# Patient Record
Sex: Male | Born: 1965
Health system: Southern US, Community
[De-identification: ages and names within clinical notes are randomized; demographics above are authoritative.]

## PROBLEM LIST (undated history)

## (undated) DIAGNOSIS — I10 Essential (primary) hypertension: Secondary | ICD-10-CM

## (undated) DIAGNOSIS — F329 Major depressive disorder, single episode, unspecified: Secondary | ICD-10-CM

## (undated) DIAGNOSIS — F32A Depression, unspecified: Secondary | ICD-10-CM

## (undated) DIAGNOSIS — W3400XA Accidental discharge from unspecified firearms or gun, initial encounter: Secondary | ICD-10-CM

## (undated) DIAGNOSIS — K7030 Alcoholic cirrhosis of liver without ascites: Secondary | ICD-10-CM

## (undated) DIAGNOSIS — F17200 Nicotine dependence, unspecified, uncomplicated: Secondary | ICD-10-CM

## (undated) DIAGNOSIS — Y249XXA Unspecified firearm discharge, undetermined intent, initial encounter: Secondary | ICD-10-CM

## (undated) DIAGNOSIS — F1090 Alcohol use, unspecified, uncomplicated: Secondary | ICD-10-CM

## (undated) HISTORY — DX: Essential (primary) hypertension: I10

## (undated) HISTORY — DX: Nicotine dependence, unspecified, uncomplicated: F17.200

## (undated) HISTORY — DX: Alcohol use, unspecified, uncomplicated: F10.90

## (undated) HISTORY — DX: Alcoholic cirrhosis of liver without ascites: K70.30

## (undated) HISTORY — PX: OTHER SURGICAL HISTORY: SHX169

---

## 1998-08-25 ENCOUNTER — Emergency Department (HOSPITAL_COMMUNITY): Admission: EM | Admit: 1998-08-25 | Discharge: 1998-08-25 | Payer: Self-pay | Admitting: Emergency Medicine

## 1998-12-03 ENCOUNTER — Emergency Department (HOSPITAL_COMMUNITY): Admission: EM | Admit: 1998-12-03 | Discharge: 1998-12-03 | Payer: Self-pay | Admitting: Emergency Medicine

## 1999-12-04 ENCOUNTER — Encounter: Payer: Self-pay | Admitting: Emergency Medicine

## 1999-12-04 ENCOUNTER — Emergency Department (HOSPITAL_COMMUNITY): Admission: EM | Admit: 1999-12-04 | Discharge: 1999-12-04 | Payer: Self-pay | Admitting: Emergency Medicine

## 1999-12-06 ENCOUNTER — Inpatient Hospital Stay: Admission: EM | Admit: 1999-12-06 | Discharge: 1999-12-07 | Payer: Self-pay | Admitting: Emergency Medicine

## 1999-12-12 ENCOUNTER — Ambulatory Visit (HOSPITAL_BASED_OUTPATIENT_CLINIC_OR_DEPARTMENT_OTHER): Admission: RE | Admit: 1999-12-12 | Discharge: 1999-12-12 | Payer: Self-pay | Admitting: *Deleted

## 2000-01-05 ENCOUNTER — Encounter: Payer: Self-pay | Admitting: *Deleted

## 2000-01-05 ENCOUNTER — Ambulatory Visit (HOSPITAL_COMMUNITY): Admission: RE | Admit: 2000-01-05 | Discharge: 2000-01-05 | Payer: Self-pay | Admitting: *Deleted

## 2000-01-06 ENCOUNTER — Ambulatory Visit (HOSPITAL_BASED_OUTPATIENT_CLINIC_OR_DEPARTMENT_OTHER): Admission: RE | Admit: 2000-01-06 | Discharge: 2000-01-07 | Payer: Self-pay | Admitting: *Deleted

## 2000-01-06 ENCOUNTER — Encounter (INDEPENDENT_AMBULATORY_CARE_PROVIDER_SITE_OTHER): Payer: Self-pay | Admitting: *Deleted

## 2000-04-08 ENCOUNTER — Encounter: Payer: Self-pay | Admitting: Emergency Medicine

## 2000-04-08 ENCOUNTER — Inpatient Hospital Stay (HOSPITAL_COMMUNITY): Admission: EM | Admit: 2000-04-08 | Discharge: 2000-04-09 | Payer: Self-pay | Admitting: Emergency Medicine

## 2000-05-07 ENCOUNTER — Emergency Department (HOSPITAL_COMMUNITY): Admission: EM | Admit: 2000-05-07 | Discharge: 2000-05-08 | Payer: Self-pay | Admitting: Emergency Medicine

## 2001-08-15 ENCOUNTER — Emergency Department (HOSPITAL_COMMUNITY): Admission: EM | Admit: 2001-08-15 | Discharge: 2001-08-15 | Payer: Self-pay | Admitting: Emergency Medicine

## 2002-08-11 ENCOUNTER — Emergency Department (HOSPITAL_COMMUNITY): Admission: EM | Admit: 2002-08-11 | Discharge: 2002-08-11 | Payer: Self-pay

## 2002-11-09 ENCOUNTER — Encounter: Payer: Self-pay | Admitting: Emergency Medicine

## 2002-11-09 ENCOUNTER — Emergency Department (HOSPITAL_COMMUNITY): Admission: EM | Admit: 2002-11-09 | Discharge: 2002-11-09 | Payer: Self-pay | Admitting: Emergency Medicine

## 2003-03-29 ENCOUNTER — Emergency Department (HOSPITAL_COMMUNITY): Admission: EM | Admit: 2003-03-29 | Discharge: 2003-03-29 | Payer: Self-pay | Admitting: Emergency Medicine

## 2003-09-12 ENCOUNTER — Emergency Department (HOSPITAL_COMMUNITY): Admission: EM | Admit: 2003-09-12 | Discharge: 2003-09-12 | Payer: Self-pay | Admitting: Emergency Medicine

## 2004-04-06 ENCOUNTER — Emergency Department (HOSPITAL_COMMUNITY): Admission: EM | Admit: 2004-04-06 | Discharge: 2004-04-07 | Payer: Self-pay | Admitting: Emergency Medicine

## 2005-04-28 ENCOUNTER — Emergency Department (HOSPITAL_COMMUNITY): Admission: EM | Admit: 2005-04-28 | Discharge: 2005-04-28 | Payer: Self-pay | Admitting: Emergency Medicine

## 2005-06-17 ENCOUNTER — Emergency Department (HOSPITAL_COMMUNITY): Admission: EM | Admit: 2005-06-17 | Discharge: 2005-06-17 | Payer: Self-pay | Admitting: Emergency Medicine

## 2007-03-25 ENCOUNTER — Inpatient Hospital Stay (HOSPITAL_COMMUNITY): Admission: EM | Admit: 2007-03-25 | Discharge: 2007-03-29 | Payer: Self-pay | Admitting: Emergency Medicine

## 2008-03-11 ENCOUNTER — Ambulatory Visit: Payer: Self-pay | Admitting: Nurse Practitioner

## 2008-03-11 DIAGNOSIS — M25569 Pain in unspecified knee: Secondary | ICD-10-CM | POA: Insufficient documentation

## 2008-03-11 DIAGNOSIS — F17200 Nicotine dependence, unspecified, uncomplicated: Secondary | ICD-10-CM | POA: Insufficient documentation

## 2008-03-17 ENCOUNTER — Encounter (INDEPENDENT_AMBULATORY_CARE_PROVIDER_SITE_OTHER): Payer: Self-pay | Admitting: Nurse Practitioner

## 2008-03-18 ENCOUNTER — Encounter (INDEPENDENT_AMBULATORY_CARE_PROVIDER_SITE_OTHER): Payer: Self-pay | Admitting: Nurse Practitioner

## 2008-03-19 ENCOUNTER — Emergency Department (HOSPITAL_COMMUNITY): Admission: EM | Admit: 2008-03-19 | Discharge: 2008-03-20 | Payer: Self-pay | Admitting: Emergency Medicine

## 2008-03-20 ENCOUNTER — Inpatient Hospital Stay (HOSPITAL_COMMUNITY): Admission: RE | Admit: 2008-03-20 | Discharge: 2008-03-23 | Payer: Self-pay | Admitting: Psychiatry

## 2008-03-20 ENCOUNTER — Ambulatory Visit: Payer: Self-pay | Admitting: Psychiatry

## 2008-03-24 ENCOUNTER — Encounter (INDEPENDENT_AMBULATORY_CARE_PROVIDER_SITE_OTHER): Payer: Self-pay | Admitting: Nurse Practitioner

## 2008-04-13 ENCOUNTER — Ambulatory Visit: Payer: Self-pay | Admitting: Nurse Practitioner

## 2008-04-13 DIAGNOSIS — K219 Gastro-esophageal reflux disease without esophagitis: Secondary | ICD-10-CM | POA: Insufficient documentation

## 2008-04-13 LAB — CONVERTED CEMR LAB
ALT: 16 units/L (ref 0–53)
Alkaline Phosphatase: 85 units/L (ref 39–117)
Basophils Relative: 1 % (ref 0–1)
Calcium: 9.4 mg/dL (ref 8.4–10.5)
Cholesterol: 144 mg/dL (ref 0–200)
Creatinine, Ser: 0.96 mg/dL (ref 0.40–1.50)
Eosinophils Absolute: 0.1 10*3/uL (ref 0.0–0.7)
Eosinophils Relative: 3 % (ref 0–5)
GC Probe Amp, Urine: NEGATIVE
Glucose, Bld: 89 mg/dL (ref 70–99)
Glucose, Urine, Semiquant: NEGATIVE
HDL: 54 mg/dL (ref >39)
Ketones, urine, test strip: NEGATIVE
LDL Cholesterol: 61 mg/dL (ref 0–99)
Lymphocytes Relative: 29 % (ref 12–46)
Lymphs Abs: 1.4 10*3/uL (ref 0.7–4.0)
MCHC: 32.3 g/dL (ref 30.0–36.0)
MCV: 95.4 fL (ref 78.0–100.0)
Neutrophils Relative %: 54 % (ref 43–77)
Nitrite: NEGATIVE
Platelets: 190 10*3/uL (ref 150–400)
Potassium: 4.4 meq/L (ref 3.5–5.3)
RDW: 14.9 % (ref 11.5–15.5)
Specific Gravity, Urine: 1.015
TSH: 1.779 microintl units/mL (ref 0.350–4.50)
Total CHOL/HDL Ratio: 2.7
Triglycerides: 144 mg/dL (ref <150)
WBC Urine, dipstick: NEGATIVE
WBC: 4.7 10*3/uL (ref 4.0–10.5)

## 2008-04-15 ENCOUNTER — Encounter (INDEPENDENT_AMBULATORY_CARE_PROVIDER_SITE_OTHER): Payer: Self-pay | Admitting: Nurse Practitioner

## 2008-04-22 ENCOUNTER — Telehealth (INDEPENDENT_AMBULATORY_CARE_PROVIDER_SITE_OTHER): Payer: Self-pay | Admitting: Nurse Practitioner

## 2008-05-14 ENCOUNTER — Ambulatory Visit: Payer: Self-pay | Admitting: Nurse Practitioner

## 2008-05-14 DIAGNOSIS — F528 Other sexual dysfunction not due to a substance or known physiological condition: Secondary | ICD-10-CM | POA: Insufficient documentation

## 2008-05-14 DIAGNOSIS — N529 Male erectile dysfunction, unspecified: Secondary | ICD-10-CM | POA: Insufficient documentation

## 2008-05-21 ENCOUNTER — Encounter (INDEPENDENT_AMBULATORY_CARE_PROVIDER_SITE_OTHER): Payer: Self-pay | Admitting: Nurse Practitioner

## 2008-08-20 ENCOUNTER — Ambulatory Visit: Payer: Self-pay | Admitting: Nurse Practitioner

## 2008-08-20 DIAGNOSIS — K029 Dental caries, unspecified: Secondary | ICD-10-CM | POA: Insufficient documentation

## 2008-08-21 ENCOUNTER — Encounter (INDEPENDENT_AMBULATORY_CARE_PROVIDER_SITE_OTHER): Payer: Self-pay | Admitting: Nurse Practitioner

## 2008-10-09 ENCOUNTER — Telehealth (INDEPENDENT_AMBULATORY_CARE_PROVIDER_SITE_OTHER): Payer: Self-pay | Admitting: Nurse Practitioner

## 2008-11-29 ENCOUNTER — Emergency Department (HOSPITAL_COMMUNITY): Admission: EM | Admit: 2008-11-29 | Discharge: 2008-11-29 | Payer: Self-pay | Admitting: Emergency Medicine

## 2008-12-24 ENCOUNTER — Ambulatory Visit: Payer: Self-pay | Admitting: Nurse Practitioner

## 2008-12-24 DIAGNOSIS — M545 Low back pain, unspecified: Secondary | ICD-10-CM | POA: Insufficient documentation

## 2008-12-24 DIAGNOSIS — M549 Dorsalgia, unspecified: Secondary | ICD-10-CM | POA: Insufficient documentation

## 2008-12-25 ENCOUNTER — Encounter (INDEPENDENT_AMBULATORY_CARE_PROVIDER_SITE_OTHER): Payer: Self-pay | Admitting: Nurse Practitioner

## 2008-12-28 ENCOUNTER — Telehealth (INDEPENDENT_AMBULATORY_CARE_PROVIDER_SITE_OTHER): Payer: Self-pay | Admitting: Nurse Practitioner

## 2009-03-29 ENCOUNTER — Telehealth (INDEPENDENT_AMBULATORY_CARE_PROVIDER_SITE_OTHER): Payer: Self-pay | Admitting: *Deleted

## 2009-05-13 ENCOUNTER — Ambulatory Visit: Payer: Self-pay | Admitting: Nurse Practitioner

## 2009-05-13 DIAGNOSIS — H6120 Impacted cerumen, unspecified ear: Secondary | ICD-10-CM | POA: Insufficient documentation

## 2009-05-13 LAB — CONVERTED CEMR LAB
ALT: 18 units/L (ref 0–53)
AST: 17 units/L (ref 0–37)
Albumin: 4.2 g/dL (ref 3.5–5.2)
Alkaline Phosphatase: 79 units/L (ref 39–117)
Basophils Relative: 1 % (ref 0–1)
CO2: 22 meq/L (ref 19–32)
Chloride: 106 meq/L (ref 96–112)
Eosinophils Absolute: 0.1 10*3/uL (ref 0.0–0.7)
HCT: 44 % (ref 39.0–52.0)
Potassium: 4.3 meq/L (ref 3.5–5.3)
RBC: 4.63 M/uL (ref 4.22–5.81)
Rapid HIV Screen: NEGATIVE
TSH: 1.197 microintl units/mL (ref 0.350–4.500)
Total Protein: 6.5 g/dL (ref 6.0–8.3)

## 2009-05-19 ENCOUNTER — Ambulatory Visit (HOSPITAL_COMMUNITY): Admission: RE | Admit: 2009-05-19 | Discharge: 2009-05-19 | Payer: Self-pay | Admitting: Internal Medicine

## 2009-05-21 ENCOUNTER — Ambulatory Visit: Payer: Self-pay | Admitting: Nurse Practitioner

## 2009-05-21 LAB — CONVERTED CEMR LAB
HDL: 56 mg/dL (ref >39)
Triglycerides: 77 mg/dL (ref <150)
VLDL: 15 mg/dL (ref 0–40)

## 2009-05-24 ENCOUNTER — Encounter (INDEPENDENT_AMBULATORY_CARE_PROVIDER_SITE_OTHER): Payer: Self-pay | Admitting: Nurse Practitioner

## 2009-06-08 ENCOUNTER — Telehealth (INDEPENDENT_AMBULATORY_CARE_PROVIDER_SITE_OTHER): Payer: Self-pay | Admitting: Nurse Practitioner

## 2009-08-06 ENCOUNTER — Emergency Department (HOSPITAL_COMMUNITY): Admission: EM | Admit: 2009-08-06 | Discharge: 2009-08-06 | Payer: Self-pay | Admitting: Emergency Medicine

## 2009-08-16 ENCOUNTER — Ambulatory Visit: Payer: Self-pay | Admitting: Nurse Practitioner

## 2009-08-16 DIAGNOSIS — K047 Periapical abscess without sinus: Secondary | ICD-10-CM | POA: Insufficient documentation

## 2009-08-20 ENCOUNTER — Encounter (INDEPENDENT_AMBULATORY_CARE_PROVIDER_SITE_OTHER): Payer: Self-pay | Admitting: Nurse Practitioner

## 2009-10-21 ENCOUNTER — Telehealth (INDEPENDENT_AMBULATORY_CARE_PROVIDER_SITE_OTHER): Payer: Self-pay | Admitting: *Deleted

## 2009-11-19 ENCOUNTER — Encounter (INDEPENDENT_AMBULATORY_CARE_PROVIDER_SITE_OTHER): Payer: Self-pay | Admitting: Nurse Practitioner

## 2009-12-07 ENCOUNTER — Ambulatory Visit: Payer: Self-pay | Admitting: Nurse Practitioner

## 2010-01-05 ENCOUNTER — Encounter (INDEPENDENT_AMBULATORY_CARE_PROVIDER_SITE_OTHER): Payer: Self-pay | Admitting: Nurse Practitioner

## 2010-03-25 ENCOUNTER — Ambulatory Visit: Payer: Self-pay | Admitting: Nurse Practitioner

## 2010-03-25 DIAGNOSIS — F329 Major depressive disorder, single episode, unspecified: Secondary | ICD-10-CM | POA: Insufficient documentation

## 2010-04-05 ENCOUNTER — Telehealth (INDEPENDENT_AMBULATORY_CARE_PROVIDER_SITE_OTHER): Payer: Self-pay | Admitting: *Deleted

## 2010-04-12 ENCOUNTER — Encounter (INDEPENDENT_AMBULATORY_CARE_PROVIDER_SITE_OTHER): Payer: Self-pay | Admitting: Nurse Practitioner

## 2010-05-20 ENCOUNTER — Encounter (INDEPENDENT_AMBULATORY_CARE_PROVIDER_SITE_OTHER): Payer: Self-pay | Admitting: Nurse Practitioner

## 2010-07-07 ENCOUNTER — Ambulatory Visit
Admission: RE | Admit: 2010-07-07 | Discharge: 2010-07-07 | Payer: Self-pay | Source: Home / Self Care | Attending: Nurse Practitioner | Admitting: Nurse Practitioner

## 2010-07-07 ENCOUNTER — Encounter (INDEPENDENT_AMBULATORY_CARE_PROVIDER_SITE_OTHER): Payer: Self-pay | Admitting: Nurse Practitioner

## 2010-07-07 LAB — CONVERTED CEMR LAB
Alkaline Phosphatase: 74 units/L (ref 39–117)
BUN: 12 mg/dL (ref 6–23)
Bilirubin Urine: NEGATIVE
Creatinine, Ser: 0.97 mg/dL (ref 0.40–1.50)
Eosinophils Absolute: 0.1 10*3/uL (ref 0.0–0.7)
HCT: 43.2 % (ref 39.0–52.0)
Hemoglobin: 14.1 g/dL (ref 13.0–17.0)
MCHC: 32.6 g/dL (ref 30.0–36.0)
MCV: 93.5 fL (ref 78.0–100.0)
Neutrophils Relative %: 39 % — ABNORMAL LOW (ref 43–77)
Nitrite: NEGATIVE
Potassium: 4.3 meq/L (ref 3.5–5.3)
Rapid HIV Screen: NEGATIVE
Sodium: 142 meq/L (ref 135–145)
Total Protein: 6.9 g/dL (ref 6.0–8.3)
WBC Urine, dipstick: NEGATIVE

## 2010-07-08 ENCOUNTER — Encounter (INDEPENDENT_AMBULATORY_CARE_PROVIDER_SITE_OTHER): Payer: Self-pay | Admitting: Nurse Practitioner

## 2010-07-25 ENCOUNTER — Ambulatory Visit: Admit: 2010-07-25 | Payer: Self-pay | Admitting: Nurse Practitioner

## 2010-07-26 ENCOUNTER — Telehealth (INDEPENDENT_AMBULATORY_CARE_PROVIDER_SITE_OTHER): Payer: Self-pay | Admitting: *Deleted

## 2010-07-26 ENCOUNTER — Encounter (INDEPENDENT_AMBULATORY_CARE_PROVIDER_SITE_OTHER): Payer: Self-pay | Admitting: Nurse Practitioner

## 2010-08-02 NOTE — Letter (Signed)
Summary: MAAILED REQUESTED RECORDS TO [facility] [employer]  [facility]AAILED REQUESTED RECORDS TO [facility] [employer]   Imported By: Arta Bruce 05/20/2010 15:49:32  _____________________________________________________________________  External Attachment:    Type:   Image     Comment:   External Document

## 2010-08-02 NOTE — Letter (Signed)
Summary: MAILED REQUESTED RECORDS TO [facility]   MAILED REQUESTED RECORDS TO [facility]   Imported By: Arta Bruce 08/20/2009 15:08:14  _____________________________________________________________________  External Attachment:    Type:   Image     Comment:   External Document

## 2010-08-02 NOTE — Letter (Signed)
Summary: PSYCHOLOGICAL EXAM  PSYCHOLOGICAL EXAM   Imported By: Arta Bruce 02/24/2010 14:49:42  _____________________________________________________________________  External Attachment:    Type:   Image     Comment:   External Document
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
## 2010-08-02 NOTE — Letter (Signed)
Summary: *Referral Letter  [facility]  [address] [place], [place] [address]   Phone: [phone]  Fax: [phone]    12/07/2009  Juwon Scripter [address] [place], [place]  [address]  Phone: [phone]  To whom it may concern, Mr.Cropper is an established patient in this office.  Currently his main issue for follow up is continued pain and dysfunction in his left knee. Knee pain has severely limited his quality of life and functional ability.  His last MRI was done November 2010.  He does need additional intervention by orthopedic but at this time is not able to afford.  He has complied with all suggestions for conservative therapies as ordered by this office including - stretches, knee brace and cane for support, medication management.    Current Medical Problems: 1)  BACK PAIN, LUMBAR (ICD-724.2) 2)  ERECTILE DYSFUNCTION (ICD-302.72) 3)  GERD (ICD-530.81) 4)  TOBACCO ABUSE (ICD-305.1) 5)  KNEE PAIN, LEFT (ICD-719.46)   Current Medications: 1)  DICLOFENAC SODIUM 75 MG TBEC (DICLOFENAC SODIUM) One tablet by mouth two times a day for pain **Take with food** 2)  CYMBALTA 30 MG CPEP (DULOXETINE HCL) Two capsules by mouth daily 3)  FLEXERIL 10 MG TABS (CYCLOBENZAPRINE HCL) 1 tablet by mouth nightly as needed for spasms 4)  CANE  MISC (MISC. DEVICES) use cane for assitive device 5)  KNEE BRACE  MISC (ELASTIC BANDAGES & SUPPORTS) Left knee brace to use during the day for support 6)  PERCOCET 5-325 MG TABS (OXYCODONE-ACETAMINOPHEN) One tablet by mouth as needed for pain   Please contact us if you have any further questions or need additional information.  Sincerely,    Lehman Prom FNP [facility]

## 2010-08-02 NOTE — Progress Notes (Signed)
Summary: SCT FORMS DROPPED OFF  Phone Note Call from Patient Call back at Home Phone [phone]   Summary of Call: MARTIN PT. MR Blumenfeld HAD HIS MOTHER TO DROP OFF FORMS FOR SCAT TO BE FILLED OUT, AND WHEN THEY ARE READY, JUST CALL PATIENT TO PICK UP. Initial call taken by: Leodis Rains,  April 05, 2010 12:28 PM  Follow-up for Phone Call        Form completed. make a copy and call pt to pick up Follow-up by: Lehman Prom FNP,  April 12, 2010 7:48 AM  Additional Follow-up for Phone Call Additional follow up Details #1::        CALLED LEFT MESSAGE FORM READY FOR PICK UP Additional Follow-up by: Arta Bruce,  April 12, 2010 10:13 AM

## 2010-08-02 NOTE — Letter (Signed)
Summary: [facility] RE[facility]ED [facility]DS TO [facility]  MA[facility]ED REQUESTED [facility]DS TO [facility]   Imported By: Arta Bruce 11/19/2009 14:24:17  _____________________________________________________________________  External Attachment:    Type:   Image     Comment:   External Document

## 2010-08-02 NOTE — Letter (Signed)
Summary: DENTAL REFERRAL  DENTAL REFERRAL   Imported By: Arta Bruce 10/12/2009 15:12:24  _____________________________________________________________________  External Attachment:    Type:   Image     Comment:   External Document

## 2010-08-02 NOTE — Assessment & Plan Note (Signed)
Summary: Acute - Dental Abscess   Vital Signs:  Patient profile:   45 year old male Weight:      190.1 pounds BMI:     29.44 BSA:     1.99 Temp:     98.0 degrees F oral Pulse rate:   91 / minute Pulse rhythm:   regular Resp:     20 per minute BP sitting:   131 / 90  (left arm) Cuff size:   large  Vitals Entered By: Levon Hedger (August 16, 2009 11:52 AM) CC: abcess tooth went to [facility] and received PCN and vicodin Is Patient Diabetic? No Pain Assessment Patient in pain? no       Does patient need assistance? Functional Status Self care Ambulation Normal   CC:  abcess tooth went to [facility] and received PCN and vicodin.  History of Present Illness:  Pt into the office with complaints of dental pain. Right upper - multiple dental caries +abscess on last week. Pt went to the ER on last week and was prescribed penicillian and Vicodin for pain. Jaw was swollen, painful +fever Improved some with medication over the past week. Admits to poor dental care over the years.     Allergies (verified): No Known Drug Allergies  Review of Systems General:  Complains of fever; none since taking PCN. CV:  Denies chest pain or discomfort. Resp:  Denies cough. GI:  Denies abdominal pain. MS:  Complains of joint pain; left knee - continues with pain, previous workup done and pt is waiting ortho .  Physical Exam  General:  alert.   Head:  normocephalic.   Mouth:  discolored and multiple dental caries but right upper molar with obvious cavity and most likely source of pain Neurologic:  limping Cervical Nodes:  shotty LAD Psych:  Oriented X3.     Impression & Recommendations:  Problem # 1:  ABSCESS, TOOTH (ICD-522.5) will do urgent referral to dental clinic symptoms improved since finishing antibiotics advised pt that tooth will need extraction to prevent recurrence of symptoms Orders: Dental Referral (Dentist)  Complete Medication List: 1)  Naprosyn 500 Mg Tabs  (Naproxen) .Marland Kitchen.. 1 tablet by mouth two times a day for inflammation 2)  Cymbalta 30 Mg Cpep (Duloxetine hcl) .... Two capsules by mouth daily 3)  Viagra 100 Mg Tabs (Sildenafil citrate) .Marland Kitchen.. 1 tablet by mouth every 30 minutes prior to sexual activity 4)  Flexeril 10 Mg Tabs (Cyclobenzaprine hcl) .Marland Kitchen.. 1 tablet by mouth nightly as needed for spasms 5)  Hydrocodone-acetaminophen 5-500 Mg Tabs (Hydrocodone-acetaminophen) .... One tablet by mouth daily as needed for pain  Patient Instructions: 1)  You will be referred urgently to the dental clinic. 2)  They will call you with the time/date of the referral

## 2010-08-02 NOTE — Letter (Signed)
Summary: Work Excuse  [facility]  [address] [place], [place] [address]   Phone: [phone]  Fax: [phone]    Today's Date: March 25, 2010  Name of Patient: [name]  The above named patient had a medical visit today  Please take this into consideration when reviewing the time away from work.   Special Instructions:  [  ] None  [ X ] To be off the remainder of today, returning to the normal work schedule tomorrow.  [  ] To be off until the next scheduled appointment on ______________________.  [  ] Other ________________________________________________________________ ________________________________________________________________________   Sincerely yours,   Lehman Prom FNP

## 2010-08-02 NOTE — Letter (Signed)
Summary: [facility] FORM /COMPLETED/PT TO PICK UP  [facility] /COMPLETED/PT TO PICK UP   Imported By: Arta Bruce 04/12/2010 10:09:46  _____________________________________________________________________  External Attachment:    Type:   Image     Comment:   External Document

## 2010-08-02 NOTE — Progress Notes (Signed)
Summary: NEEDS REFILLS  Phone Note Call from Patient Call back at Home Phone [phone]   Reason for Call: Refill Medication Summary of Call: Dylan PT. MR [name] IS CALLING FOR A REFILL ON HIS HYDROCODONE AND FLEXERIL TO [facility] ON [address]  HE ALSO NEEDS HIS VIAGRA CALLED INTO [facility] Initial call taken by: Leodis Rains,  October 21, 2009 3:57 PM  Follow-up for Phone Call        forward to N. Daphine Deutscher, FNP Follow-up by: Levon Hedger,  October 21, 2009 3:59 PM  Additional Follow-up for Phone Call Additional follow up Details #1::        rx printed an in basket fax to [facility] - notify pt Additional Follow-up by: Lehman Prom FNP,  October 21, 2009 4:19 PM    Additional Follow-up for Phone Call Additional follow up Details #2::    left msg with his mother to have pt call........ Elmarie Shiley McCoy CMA  October 21, 2009 4:31 PM   Pt aware of the message.Manon Hilding  October 21, 2009 4:35 PM  Prescriptions: HYDROCODONE-ACETAMINOPHEN 5-500 MG TABS (HYDROCODONE-ACETAMINOPHEN) One tablet by mouth daily as needed for pain  #30 x 0   Entered and Authorized by:   Lehman Prom FNP   Signed by:   Lehman Prom FNP on 10/21/2009   Method used:   Printed then faxed to ...       [facility] [address] [phone]* (retail)       [address]       [place], [place]  [address]       Ph: [phone]       Fax: [phone]   RxID:   [id number] FLEXERIL 10 MG TABS (CYCLOBENZAPRINE HCL) 1 tablet by mouth nightly as needed for spasms  #30 x 0   Entered and Authorized by:   Lehman Prom FNP   Signed by:   Lehman Prom FNP on 10/21/2009   Method used:   Printed then faxed to ...       [facility] [address] [phone]* (retail)       [address]       [place], [place]  [address]       Ph: [phone]       Fax: [phone]   RxID:   [id number] VIAGRA 100 MG TABS (SILDENAFIL CITRATE) 1 tablet by mouth every 30 minutes prior to sexual activity  #10 x 0   Entered and Authorized by:    Lehman Prom FNP   Signed by:   Lehman Prom FNP on 10/21/2009   Method used:   Faxed to ...       [facility] (retail)       [address] [place], [place]  [address]       Ph: [phone]       Fax: [phone]   RxID:   [id number]
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
## 2010-08-04 NOTE — Letter (Signed)
Summary: *HSN Results Follow up  [facility]  [address] [place], [place] [address]   Phone: [phone]  Fax: [phone]      07/08/2010   WESTEN DININO [address] [place], [place]  [address]   Dear  Mr. ACIE CUSTIS,                            ____S.Drinkard,FNP   ____D. Gore,FNP       ____B. McPherson,MD   ____V. Rankins,MD    ____E. Mulberry,MD    _X___N. Daphine Deutscher, FNP  ____D. Reche Dixon, MD    ____K. Philipp Deputy, MD    ____Other     This letter is to inform you that your recent test(s):  _______Pap Smear    ___X____Lab Test     _______X-ray    ___X____ is within acceptable limits  _______ requires a medication change  _______ requires a follow-up lab visit  _______ requires a follow-up visit with your provider   Comments:  Lsbs done during recent office visit are normal.       _________________________________________________________ If you have any questions, please contact our office [phone].                    Sincerely,    Lehman Prom FNP [facility]

## 2010-08-04 NOTE — Letter (Signed)
Summary: MAILED REQUESTED RECORDS TO  [facility] [employer]  MAILED REQUESTED RECORDS TO  [facility] [employer]   Imported By: Arta Bruce 07/26/2010 09:41:26  _____________________________________________________________________  External Attachment:    Type:   Image     Comment:   External Document

## 2010-08-04 NOTE — Assessment & Plan Note (Signed)
Summary: Back/Leg pain   Vital Signs:  Patient profile:   45 year old male Weight:      189.9 pounds Temp:     97.9 degrees F oral Pulse rate:   80 / minute Pulse rhythm:   regular Resp:     20 per minute BP sitting:   130 / 90  (left arm) Cuff size:   large  Vitals Entered By: Levon Hedger (July 07, 2010 3:12 PM) CC: leg pain, pt feel and his leg gave out on him...renew medcation...needs form filled out, Abdominal Pain, Back Pain Is Patient Diabetic? No Pain Assessment Patient in pain? yes     Location: knee, back, hip Intensity: 5  Does patient need assistance? Functional Status Self care Ambulation Normal, Impaired:Risk for fall   CC:  leg pain, pt feel and his leg gave out on him...renew medcation...needs form filled out, Abdominal Pain, and Back Pain.  History of Present Illness:  Pt into the office for chronic f/u - left leg pain. Pt presents today with a form for completion  Back and knee pain is ongoing. Prolonged standing makes the pain much worse. He does have a cane that he uses for ambulation - reports a fall about 2 weeks ago and he struck his head on a metal earn. No loss of consciousness but dizziness afterward. He has had multiple falls over the past 3 years due to knee problem  Back Pain History:      The patient's back pain has been present for > 6 weeks.  He states that he has had a prior history of back pain.  The patient has not had any recent physical therapy for his back pain.  The following makes the back pain worse: prolonged standing and ambulation.    Critical Exclusionary Diagnosis Criteria (CEDC) for Back Pain:      The patient denies a history of previous trauma.  He has no prior history of spinal surgery.  There are no symptoms to suggest infection, cancer, cauda equina, or psychosocial factors for back pain.  Other positive CEDC factors include low back pain worse with activity.    Dyspepsia History:      He has no alarm features  of dyspepsia including no history of melena, hematochezia, dysphagia, persistent vomiting, or involuntary weight loss > 5%.  There is a prior history of GERD.  The patient has a prior history of documented ulcer disease.  The dominant symptom is not heartburn or acid reflux.  An H-2 blocker medication is not currently being taken.      Habits & Providers  Alcohol-Tobacco-Diet     Alcohol drinks/day: 1     Alcohol Counseling: to decrease amount and/or frequency of alcohol intake     Alcohol type: beer     Tobacco Status: current     Tobacco Counseling: to quit use of tobacco products     Cigarette Packs/Day: 1     Year Started: age 26     Passive Smoke Exposure: no  Exercise-Depression-Behavior     Does Patient Exercise: no     Depression Counseling: further diagnostic testing and/or other treatment is indicated     Drug Use: yes     Seat Belt Use: 100     Sun Exposure: rarely  Allergies (verified): No Known Drug Allergies  Review of Systems CV:  Denies chest pain or discomfort. Resp:  Denies cough. GI:  Denies abdominal pain, nausea, and vomiting. MS:  Complains of joint pain;  left knee pain - pt does still get pain meds but he takes it very infrequently Right hip pain - started most recently since his last visit probably due to shifting gait.  Physical Exam  General:  alert.   Head:  normocephalic.   Lungs:  normal breath sounds.   Heart:  normal rate and regular rhythm.   Abdomen:  normal bowel sounds.   Msk:  left knee - brace in place slow, sliding gait Neurologic:  alert & oriented X3.   Psych:  cane use   Impression & Recommendations:  Problem # 1:  KNEE PAIN, LEFT (ICD-719.46) ongoing  knee brace in place in addition to cane His updated medication list for this problem includes:    Diclofenac Sodium 75 Mg Tbec (Diclofenac sodium) ..... One tablet by mouth two times a day for pain **take with food**    Carisoprodol 350 Mg Tabs (Carisoprodol) ..... One tablet  by mouth two times a day as needed for pain    Percocet 5-325 Mg Tabs (Oxycodone-acetaminophen) ..... One tablet by mouth as needed for pain  Problem # 2:  BACK PAIN, LUMBAR (ICD-724.2)  His updated medication list for this problem includes:    Diclofenac Sodium 75 Mg Tbec (Diclofenac sodium) ..... One tablet by mouth two times a day for pain **take with food**    Carisoprodol 350 Mg Tabs (Carisoprodol) ..... One tablet by mouth two times a day as needed for pain    Percocet 5-325 Mg Tabs (Oxycodone-acetaminophen) ..... One tablet by mouth as needed for pain  Problem # 3:  TOBACCO ABUSE (ICD-305.1) advised cessation  Problem # 4:  NEED PROPHYLACTIC VACCINATION&INOCULATION FLU (ICD-V04.81) given today  Complete Medication List: 1)  Diclofenac Sodium 75 Mg Tbec (Diclofenac sodium) .... One tablet by mouth two times a day for pain **take with food** 2)  Cymbalta 30 Mg Cpep (Duloxetine hcl) .... Two capsules by mouth daily 3)  Carisoprodol 350 Mg Tabs (Carisoprodol) .... One tablet by mouth two times a day as needed for pain 4)  Cane Misc (Misc. devices) .... Use cane for assitive device 5)  Knee Brace Misc (Elastic bandages & supports) .... Left knee brace to use during the day for support 6)  Percocet 5-325 Mg Tabs (Oxycodone-acetaminophen) .... One tablet by mouth as needed for pain 7)  Depakote 500 Mg Tbec (Divalproex sodium) .... Rx by [facility]  Other Orders: T-Comprehensive Metabolic Panel [phone]) T-PSA [phone]) T-CBC w/Diff [phone]) Rapid HIV  (92370) T-Syphilis Test (RPR) [phone]) T-TSH [phone]) Flu Vaccine 56yrs + (38756) Admin 1st Vaccine (43329)  Patient Instructions: 1)  You need lipids. 2)  Schedule an appointment for cholesterol  3)  No food after midnight before this visit 4)  Muscle Relaxer - Soma (this may make you sleepy so try it at first when you are at home)  it should not make you as sleepy as the flexeril. 5)  You have been  given flu vaccine today 6)  Your form will be completed and you will be notified of when it is ready. 7)  Follow up in 3 months or sooner if necessary. Prescriptions: PERCOCET 5-325 MG TABS (OXYCODONE-ACETAMINOPHEN) One tablet by mouth as needed for pain  #30 x 0   Entered and Authorized by:   Lehman Prom FNP   Signed by:   Lehman Prom FNP on 07/07/2010   Method used:   Print then Give to Patient   RxID:   [id number] CARISOPRODOL 350 MG TABS (CARISOPRODOL) One  tablet by mouth two times a day as needed for pain  #50 x 0   Entered and Authorized by:   Lehman Prom FNP   Signed by:   Lehman Prom FNP on 07/07/2010   Method used:   Print then Give to Patient   RxID:   [id number]    Orders Added: 1)  Est. Patient Level III [14782] 2)  T-Comprehensive Metabolic Panel [[id number]] 3)  T-PSA [95621-30865] 4)  T-CBC w/Diff [[id number]] 5)  Rapid HIV  [92370] 6)  T-Syphilis Test (RPR) [84132-44010] 7)  T-TSH [27253-66440] 8)  Flu Vaccine 61yrs + [34742] 9)  Admin 1st Vaccine [59563]   Immunizations Administered:  Influenza Vaccine # 1:    Vaccine Type: Fluvax 3+    Site: left deltoid    Mfr: GlaxoSmithKline    Dose: 0.5 ml    Route: IM    Given by: Levon Hedger    Exp. Date: 12/31/2010    Lot #: [id number]    VIS given: 01/25/10 version given July 07, 2010.  Flu Vaccine Consent Questions:    Do you have a history of severe allergic reactions to this vaccine? no    Any prior history of allergic reactions to egg and/or gelatin? no    Do you have a sensitivity to the preservative Thimersol? no    Do you have a past history of Guillan-Barre Syndrome? no    Do you currently have an acute febrile illness? no    Have you ever had a severe reaction to latex? no    Vaccine information given and explained to patient? yes    ndc  [id number]  Immunizations Administered:  Influenza Vaccine # 1:    Vaccine Type: Fluvax 3+    Site: left deltoid     Mfr: GlaxoSmithKline    Dose: 0.5 ml    Route: IM    Given by: Levon Hedger    Exp. Date: 12/31/2010    Lot #: [id number]    VIS given: 01/25/10 version given July 07, 2010.  Laboratory Results   Urine Tests  Date/Time Received: July 07, 2010 3:30 PM   Routine Urinalysis   Color: lt. yellow Appearance: Clear Glucose: negative   (Normal Range: Negative) Bilirubin: negative   (Normal Range: Negative) Ketone: negative   (Normal Range: Negative) Spec. Gravity: >=1.030   (Normal Range: 1.003-1.035) Blood: negative   (Normal Range: Negative) pH: 6.0   (Normal Range: 5.0-8.0) Protein: negative   (Normal Range: Negative) Urobilinogen: 0.2   (Normal Range: 0-1) Nitrite: negative   (Normal Range: Negative) Leukocyte Esterace: negative   (Normal Range: Negative)    Date/Time Received: July 07, 2010 5:23 PM   Other Tests  Rapid HIV: negative    Prevention & Chronic Care Immunizations   Influenza vaccine: Fluvax 3+  (07/07/2010)    Tetanus booster: 04/13/2008: Tdap    Pneumococcal vaccine: Not documented  Other Screening   PSA: 0.39  (04/13/2008)   PSA ordered.   Smoking status: current  (07/07/2010)   Smoking cessation counseling: yes  (08/20/2008)  Lipids   Total Cholesterol: 188  (05/21/2009)   LDL: 117  (05/21/2009)   LDL Direct: Not documented   HDL: 56  (05/21/2009)   Triglycerides: 77  (05/21/2009)   Nursing Instructions: Give Flu vaccine today     Laboratory Results   Urine Tests    Routine Urinalysis   Color: lt. yellow Appearance: Clear Glucose: negative   (Normal Range: Negative) Bilirubin: negative   (  Normal Range: Negative) Ketone: negative   (Normal Range: Negative) Spec. Gravity: >=1.030   (Normal Range: 1.003-1.035) Blood: negative   (Normal Range: Negative) pH: 6.0   (Normal Range: 5.0-8.0) Protein: negative   (Normal Range: Negative) Urobilinogen: 0.2   (Normal Range: 0-1) Nitrite: negative   (Normal Range:  Negative) Leukocyte Esterace: negative   (Normal Range: Negative)      Other Tests  Rapid HIV: negative

## 2010-08-04 NOTE — Progress Notes (Signed)
Summary: Form  Phone Note Outgoing Call   Summary of Call: form completed make copy for chart - send back as directed Let pt know form was completed and returned Initial call taken by: Lehman Prom FNP,  July 26, 2010 8:48 AM  Follow-up for Phone Call        CALLED PT LEFT MESSAGE /MAILED FORM Follow-up by: Arta Bruce,  July 26, 2010 9:45 AM

## 2010-09-26 ENCOUNTER — Encounter (INDEPENDENT_AMBULATORY_CARE_PROVIDER_SITE_OTHER): Payer: Self-pay | Admitting: Nurse Practitioner

## 2010-09-27 ENCOUNTER — Encounter (INDEPENDENT_AMBULATORY_CARE_PROVIDER_SITE_OTHER): Payer: Self-pay | Admitting: Nurse Practitioner

## 2010-10-04 NOTE — Letter (Signed)
Summary: FAXED REQUESTED LABS TO THE [facility]  FAXED REQUESTED LABS TO THE [facility]   Imported By: Arta Bruce 09/27/2010 14:54:21  _____________________________________________________________________  External Attachment:    Type:   Image     Comment:   External Document

## 2010-10-04 NOTE — Letter (Signed)
Summary: RECORDS RECEIVED FROM THE [facility] RECEIVED FROM THE [facility]   Imported By: Arta Bruce 09/27/2010 14:41:50  _____________________________________________________________________  External Attachment:    Type:   Image     Comment:   External Document
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
## 2010-11-18 NOTE — Op Note (Signed)
[place]. [facility]  Patient:    [name], [name]                       MRN: [id number] Proc. Date: 01/06/00 Adm. Date:  [id number] Attending:  Kendell Bane                           Operative Report  PREOPERATIVE DIAGNOSIS:  Acute osteomyelitis, first metacarpal right thumb.  POSTOPERATIVE DIAGNOSIS:  Acute osteomyelitis, first metacarpal right thumb.  PROCEDURE:  Incision and drainage of first metacarpal right thumb.  SURGEON:  Lowell Bouton, M.D.  ANESTHESIA:  General.  OPERATIVE FINDINGS:  The patient had adherence of his extensor tendons with some periosteal elevation over the dorsum of the distal metacarpal.  There was no gross purulent material in the medullary canal or in the joint space.  PROCEDURE:  Under general anesthesia with a tourniquet on the right arm, the right hand was prepped and draped in usual fashion.  After elevating the limb, the tourniquet was inflated to 250 mmHg.  A longitudinal incision was made over the dorsum of the MP joint in the midline.  Sharp dissection was carried through the subcutaneous tissues and bleeding points were coagulated.  Blunt dissection was carried down between the extensor pollicis longus and brevis and the tendons were freed up of any scar tissue.  The previous pin site was identified and a drill hole was made adjacent to the pin site into the metaphysis of the bone.  A curet was used to remove bone tissue for pathology and cultures were obtained using an aerobic and anaerobic.  The wound was then copiously irrigated with antibiotic solution.  Iodoform packing was left in and the wound was closed with 4-0 nylon sutures.  Sterile dressings were applied, followed by a thumb spica splint.  The tourniquet was released with good circulation of the hand.  The patient went to the recovery room awake and stable in good condition. DD:  01/06/00 TD:  01/06/00 Job: [id number]

## 2010-11-18 NOTE — H&P (Signed)
[place]. [facility]  Patient:    [name], [name]                       MRN: [id number] Adm. Date:  [id number] Disc. Date: [id number] Attending:  Anastasio Auerbach                         History and Physical  CHIEF COMPLAINT: Overdose.  HISTORY OF PRESENT ILLNESS: The patient is a 45 year old black male brought in by EMT after being found with decreased level of consciousness by his roommate.  He was at his roommates house tonight and they were drinking some alcohol, and the patient later became lethargic and disoriented.  The roommate went into his bathroom and found several bottles with old medications.  The bottles contained Vicodin 7.5/500 tablets and trimox 500 mg tablets.  The roommate was unclear how many pills had been in the bottles or how many were taken as these were old prescriptions.  There were also some over-the-counter medications in the cabinet which may have been taken, though no direct evidence of that.  EMT was called and when they arrived they found the patient to be lethargic.  He was given Narcan 2 mg IV but had no significant change. He was transported to [facility] Emergency Room, where he was given activated charcoal 50 g.  He has been depressed of late per the roommate.  His wife is at [facility] getting a bone marrow transplant for possible leukemia.  PAST MEDICAL HISTORY: Acute osteomyelitis of right thumb.  PAST SURGICAL HISTORY:  1. Repair of right thumb radial collateral ligament.  2. I&D of first metacarpal, right thumb.  ALLERGIES: No known drug allergies.  MEDICATIONS: Unclear.  FAMILY HISTORY: Unobtainable.  SOCIAL HISTORY: [employer].  Two divorces, third marriage.  Smokes one packs of cigarettes per day.  Has used cocaine, marijuana in the past.  No history of IV drug abuse per an earlier chart.  Three children.  REVIEW OF SYSTEMS: Unobtainable.  PHYSICAL EXAMINATION:  GENERAL: This patient is a lethargic but  arousable black male, who follows commands and is oriented x 2.  VITAL SIGNS: Blood pressure 129/90, heart rate 76, respirations 20.  Oxygen saturation 100% on room air.  HEENT: Pupils are dilated, reactive.  EOMI.  The patient was not cooperative with funduscopic examination.  Tympanic membranes were normal.  Oral cavity and pharynx clear.  NECK: Supple.  No lymphadenopathy.  LUNGS: Clear.  HEART: Regular rate and rhythm without murmurs, rubs, or gallops.  ABDOMEN: Soft, nontender.  Normal bowel sounds without masses.  EXTREMITIES: No edema.  NEUROLOGIC: Cranial nerves intact.  Moves all extremities.  Reflexes 2/4 with toes downgoing.  LABORATORY DATA: Sodium 135, potassium 3.1, bicarbonate 25, chloride 102, BUN 10, creatinine 0.8.  Liver function tests normal.  Acetaminophen level at two hours was 31.7 and at four hours was 12.7.  Alcohol level was less than 10. Salicylate level 4.  Urine drug screen positive for benzodiazepines, cocaine, THC, opiates.  CBC showed a hemoglobin of 14.3, WBC 6.9, platelet count 172,000.  Chest x-ray is pending.  CT scan of the head is negative.  EKG shows normal sinus rhythm, right atrial enlargement; nonspecific ST-T wave changes; QTC interval 491.  Repeat EKG eight hours later showed a normal QTC interval.  ASSESSMENT:  1. Overdose.  Likely took Vicodin.  Question is patient took any other     ingestions.  2. Multi-drug abuse.  Positive for benzodiazepines, cocaine, THC, opiates.  3. Depression.  4. Mild hypokalemia.  PLAN:  1. Admit.  2. Telemetry.  3. IV fluids.  4. NPO until more alert and then regular diet.  5. Psychiatric consultation.  6. Suicide precautions.  7. Replete potassium in IV. DD:  04/08/00 TD:  04/09/00 Job: [id number]
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
## 2010-11-18 NOTE — Op Note (Signed)
[place]. [facility]  Patient:    [name], [name]                       MRN: [id number] Proc. Date: 12/13/99 Adm. Date:  [id number] Disc. Date: [id number] Attending:  Kendell Bane                           Operative Report  PREOPERATIVE DIAGNOSIS:  Radial collateral ligament tear, metacarpophalangeal joint, right thumb.  POSTOPERATIVE DIAGNOSIS:  Radial collateral ligament tear, metacarpophalangeal joint, right thumb.  OPERATION:  Repair of radial collateral ligament, MP joint, right thumb.  SURGEON:  Lowell Bouton, M.D.  ANESTHESIA:  General.  OPERATIVE FINDINGS:  The patient had a midsubstance tear and the proximal end of the ligament was evulsed back underneath the abductor into the dorsal capsule.  The joint was voluntary subluxed.  DESCRIPTION OF PROCEDURE:  Under general anesthesia with the tourniquet on the right arm, the right hand was prepped and draped in the usual fashion.  After exsanguinating the limb the tourniquet was inflated to 250 mmHg.  An S-shaped incision was made on the radial side of the MP joint of the right thumb. Sharp dissection was carried through the subcutaneous tissues and bleeding points were coagulated.  Blunt dissection was carried down to the abductor tendon which was divided longitudinally.  The capsular tissue was found to be ecchymotic and thickened.  Blunt dissection was carried dorsally over the capsule and the ligament was found dorsally beneath the capsular tissue. There was a small piece of bone attached to the ligament that appeared to come off of the carpal neck.  The joint was examined and there was a small articular defect on the metacarpal head.  No loose bodies were present.  The joint surfaces appeared smooth with the exception of the one area of cartilaginous defect.  The collateral ligament was then brought back to the thumb on the proximal phalanx and was repaired with a 4-0  Mersilene suture.  A 45 K-wire was used to pin the MP joint in a reduced position and was bend over and left protruding from the skin.  After repairing the ligament the dorsal capsular tissue was repaired with 4-0 Mersilene and abductor was repaired with 4-0 Mersilene.  The wound was irrigated with saline.  The skin was closed with a 4-0 subcuticular Prolene.  Steri-Strips were applied followed by sterile dressings and thumb spica splint.  The patient tolerated the procedure well and went to the recovery room awake and stable in good condition. DD:  12/13/99 TD:  12/15/99 Job: [id number]
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
## 2011-04-03 LAB — RAPID URINE DRUG SCREEN, HOSP PERFORMED
Amphetamines: NOT DETECTED
Barbiturates: NOT DETECTED
Cocaine: NOT DETECTED
Tetrahydrocannabinol: NOT DETECTED

## 2011-04-03 LAB — ETHANOL: Alcohol, Ethyl (B): 187 — ABNORMAL HIGH

## 2011-04-03 LAB — COMPREHENSIVE METABOLIC PANEL
ALT: 24
AST: 24
BUN: 10
GFR calc non Af Amer: >60
Glucose, Bld: 79
Total Bilirubin: 0.8

## 2011-04-03 LAB — DIFFERENTIAL
Eosinophils Absolute: 0.1
Eosinophils Relative: 2
Monocytes Absolute: 0.6
Monocytes Relative: 9
Neutro Abs: 2.6
Neutrophils Relative %: 40 — ABNORMAL LOW

## 2011-04-03 LAB — CBC
Hemoglobin: 14.7
MCHC: 34.5
Platelets: 166
RBC: 4.58
RDW: 14.2

## 2011-04-03 LAB — ACETAMINOPHEN LEVEL: Acetaminophen (Tylenol), Serum: <10 — ABNORMAL LOW

## 2011-04-13 LAB — CBC
Hemoglobin: 11.1 — ABNORMAL LOW
Hemoglobin: 13.6
MCHC: 34
MCV: 90.5
MCV: 91
Platelets: 166
RBC: 3.6 — ABNORMAL LOW
RBC: 4.43
RDW: 14.3 — ABNORMAL HIGH
WBC: 7.7

## 2011-04-13 LAB — TYPE AND SCREEN: Antibody Screen: NEGATIVE

## 2011-04-13 LAB — PROTIME-INR
INR: 1
Prothrombin Time: 13.4

## 2011-04-22 ENCOUNTER — Emergency Department (HOSPITAL_COMMUNITY)
Admission: EM | Admit: 2011-04-22 | Discharge: 2011-04-22 | Disposition: A | Discharge status: Home / Self Care | Payer: Self-pay | Attending: Emergency Medicine | Admitting: Emergency Medicine

## 2011-04-22 DIAGNOSIS — G8929 Other chronic pain: Secondary | ICD-10-CM | POA: Insufficient documentation

## 2011-04-22 DIAGNOSIS — R112 Nausea with vomiting, unspecified: Secondary | ICD-10-CM | POA: Insufficient documentation

## 2011-04-22 DIAGNOSIS — F1010 Alcohol abuse, uncomplicated: Secondary | ICD-10-CM | POA: Insufficient documentation

## 2011-04-22 DIAGNOSIS — F3289 Other specified depressive episodes: Secondary | ICD-10-CM | POA: Insufficient documentation

## 2011-04-22 DIAGNOSIS — Z79899 Other long term (current) drug therapy: Secondary | ICD-10-CM | POA: Insufficient documentation

## 2011-04-22 DIAGNOSIS — R630 Anorexia: Secondary | ICD-10-CM | POA: Insufficient documentation

## 2011-04-22 DIAGNOSIS — R1013 Epigastric pain: Secondary | ICD-10-CM | POA: Insufficient documentation

## 2011-04-22 DIAGNOSIS — R10811 Right upper quadrant abdominal tenderness: Secondary | ICD-10-CM | POA: Insufficient documentation

## 2011-04-22 DIAGNOSIS — Z9889 Other specified postprocedural states: Secondary | ICD-10-CM | POA: Insufficient documentation

## 2011-04-22 DIAGNOSIS — K859 Acute pancreatitis without necrosis or infection, unspecified: Secondary | ICD-10-CM | POA: Insufficient documentation

## 2011-04-22 DIAGNOSIS — F329 Major depressive disorder, single episode, unspecified: Secondary | ICD-10-CM | POA: Insufficient documentation

## 2011-04-22 DIAGNOSIS — M25569 Pain in unspecified knee: Secondary | ICD-10-CM | POA: Insufficient documentation

## 2011-04-22 LAB — DIFFERENTIAL
Blasts: 0 %
Lymphocytes Relative: 4 % — ABNORMAL LOW (ref 12–46)
Lymphs Abs: 0.3 10*3/uL — ABNORMAL LOW (ref 0.7–4.0)
Monocytes Absolute: 0.5 10*3/uL (ref 0.1–1.0)
Monocytes Relative: 6 % (ref 3–12)
Neutro Abs: 6.7 10*3/uL (ref 1.7–7.7)
Neutrophils Relative %: 89 % — ABNORMAL HIGH (ref 43–77)
Promyelocytes Absolute: 0 %
nRBC: 0 /100 WBC

## 2011-04-22 LAB — COMPREHENSIVE METABOLIC PANEL
Albumin: 3.1 g/dL — ABNORMAL LOW (ref 3.5–5.2)
BUN: 9 mg/dL (ref 6–23)
Calcium: 8 mg/dL — ABNORMAL LOW (ref 8.4–10.5)
Chloride: 101 mEq/L (ref 96–112)
Creatinine, Ser: 0.77 mg/dL (ref 0.50–1.35)
Total Bilirubin: 0.9 mg/dL (ref 0.3–1.2)

## 2011-04-22 LAB — URINALYSIS, ROUTINE W REFLEX MICROSCOPIC
Bilirubin Urine: NEGATIVE
Nitrite: NEGATIVE
Specific Gravity, Urine: 1.022 (ref 1.005–1.030)
Urobilinogen, UA: 1 mg/dL (ref 0.0–1.0)
pH: 5.5 (ref 5.0–8.0)

## 2011-04-22 LAB — POCT I-STAT TROPONIN I: Troponin i, poc: 0.01 ng/mL (ref 0.00–0.08)

## 2011-04-22 LAB — LIPASE, BLOOD: Lipase: 59 U/L (ref 11–59)

## 2011-04-25 LAB — CBC
HCT: 40.5 % (ref 39.0–52.0)
MCH: 31.8 pg (ref 26.0–34.0)
MCHC: 33.8 g/dL (ref 30.0–36.0)
MCV: 94 fL (ref 78.0–100.0)
Platelets: 141 10*3/uL — ABNORMAL LOW (ref 150–400)
RDW: 14.4 % (ref 11.5–15.5)
WBC: 7.6 10*3/uL (ref 4.0–10.5)

## 2013-01-14 ENCOUNTER — Emergency Department (HOSPITAL_COMMUNITY)
Admission: EM | Admit: 2013-01-14 | Discharge: 2013-01-14 | Disposition: A | Discharge status: Home / Self Care | Payer: Self-pay | Attending: Emergency Medicine | Admitting: Emergency Medicine

## 2013-01-14 ENCOUNTER — Emergency Department (HOSPITAL_COMMUNITY): Payer: Self-pay

## 2013-01-14 DIAGNOSIS — Y9389 Activity, other specified: Secondary | ICD-10-CM | POA: Insufficient documentation

## 2013-01-14 DIAGNOSIS — S79912A Unspecified injury of left hip, initial encounter: Secondary | ICD-10-CM

## 2013-01-14 DIAGNOSIS — S79919A Unspecified injury of unspecified hip, initial encounter: Secondary | ICD-10-CM | POA: Insufficient documentation

## 2013-01-14 DIAGNOSIS — Z79899 Other long term (current) drug therapy: Secondary | ICD-10-CM | POA: Insufficient documentation

## 2013-01-14 DIAGNOSIS — Y92410 Unspecified street and highway as the place of occurrence of the external cause: Secondary | ICD-10-CM | POA: Insufficient documentation

## 2013-01-14 DIAGNOSIS — W1809XA Striking against other object with subsequent fall, initial encounter: Secondary | ICD-10-CM | POA: Insufficient documentation

## 2013-01-14 DIAGNOSIS — R209 Unspecified disturbances of skin sensation: Secondary | ICD-10-CM | POA: Insufficient documentation

## 2013-01-14 MED ORDER — OXYCODONE-ACETAMINOPHEN 5-325 MG PO TABS
1.0000 | ORAL_TABLET | Freq: Once | ORAL | Status: AC
  Administered 2013-01-14: 1 via ORAL
  Filled 2013-01-14: qty 1

## 2013-01-14 MED ORDER — DIAZEPAM 5 MG PO TABS
5.0000 mg | ORAL_TABLET | Freq: Once | ORAL | Status: AC
  Administered 2013-01-14: 5 mg via ORAL
  Filled 2013-01-14: qty 1

## 2013-01-14 NOTE — ED Notes (Signed)
PA at bedside.
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
## 2013-01-15 NOTE — ED Provider Notes (Signed)
Medical screening examination/treatment/procedure(s) were performed by non-physician practitioner and as supervising physician I was immediately available for consultation/collaboration.  Toy Baker, MD 01/15/13 [phone]

## 2013-02-16 ENCOUNTER — Emergency Department (HOSPITAL_COMMUNITY)
Admission: EM | Admit: 2013-02-16 | Discharge: 2013-02-16 | Disposition: A | Discharge status: Home / Self Care | Payer: Self-pay | Attending: Emergency Medicine | Admitting: Emergency Medicine

## 2013-02-16 ENCOUNTER — Emergency Department (HOSPITAL_COMMUNITY): Payer: Self-pay

## 2013-02-16 ENCOUNTER — Encounter (HOSPITAL_COMMUNITY): Payer: Self-pay | Admitting: *Deleted

## 2013-02-16 DIAGNOSIS — F17200 Nicotine dependence, unspecified, uncomplicated: Secondary | ICD-10-CM | POA: Insufficient documentation

## 2013-02-16 DIAGNOSIS — F3289 Other specified depressive episodes: Secondary | ICD-10-CM | POA: Insufficient documentation

## 2013-02-16 DIAGNOSIS — M79609 Pain in unspecified limb: Secondary | ICD-10-CM | POA: Insufficient documentation

## 2013-02-16 DIAGNOSIS — M7989 Other specified soft tissue disorders: Secondary | ICD-10-CM

## 2013-02-16 DIAGNOSIS — Z79899 Other long term (current) drug therapy: Secondary | ICD-10-CM | POA: Insufficient documentation

## 2013-02-16 DIAGNOSIS — M25439 Effusion, unspecified wrist: Secondary | ICD-10-CM | POA: Insufficient documentation

## 2013-02-16 DIAGNOSIS — T148XXA Other injury of unspecified body region, initial encounter: Secondary | ICD-10-CM

## 2013-02-16 DIAGNOSIS — F329 Major depressive disorder, single episode, unspecified: Secondary | ICD-10-CM | POA: Insufficient documentation

## 2013-02-16 DIAGNOSIS — Z87828 Personal history of other (healed) physical injury and trauma: Secondary | ICD-10-CM | POA: Insufficient documentation

## 2013-02-16 DIAGNOSIS — IMO0001 Reserved for inherently not codable concepts without codable children: Secondary | ICD-10-CM | POA: Insufficient documentation

## 2013-02-16 HISTORY — DX: Major depressive disorder, single episode, unspecified: F32.9

## 2013-02-16 HISTORY — DX: Depression, unspecified: F32.A

## 2013-02-16 HISTORY — DX: Accidental discharge from unspecified firearms or gun, initial encounter: W34.00XA

## 2013-02-16 HISTORY — DX: Unspecified firearm discharge, undetermined intent, initial encounter: Y24.9XXA

## 2013-02-16 LAB — BASIC METABOLIC PANEL
CO2: 21 mEq/L (ref 19–32)
Chloride: 102 mEq/L (ref 96–112)
Sodium: 138 mEq/L (ref 135–145)

## 2013-02-16 LAB — CBC WITH DIFFERENTIAL/PLATELET
Basophils Absolute: 0 10*3/uL (ref 0.0–0.1)
HCT: 41.3 % (ref 39.0–52.0)
Lymphocytes Relative: 46 % (ref 12–46)
Lymphs Abs: 2 10*3/uL (ref 0.7–4.0)
Monocytes Absolute: 0.4 10*3/uL (ref 0.1–1.0)
Neutro Abs: 1.8 10*3/uL (ref 1.7–7.7)
Platelets: 161 10*3/uL (ref 150–400)
RBC: 4.42 MIL/uL (ref 4.22–5.81)
RDW: 13.1 % (ref 11.5–15.5)
WBC: 4.3 10*3/uL (ref 4.0–10.5)

## 2013-02-16 LAB — SEDIMENTATION RATE: Sed Rate: 1 mm/hr (ref 0–16)

## 2013-02-16 LAB — APTT: aPTT: 32 seconds (ref 24–37)

## 2013-02-16 MED ORDER — HYDROMORPHONE HCL PF 1 MG/ML IJ SOLN
1.0000 mg | Freq: Once | INTRAMUSCULAR | Status: AC
  Administered 2013-02-16: 1 mg via INTRAVENOUS
  Filled 2013-02-16: qty 1

## 2013-02-16 MED ORDER — GADOBENATE DIMEGLUMINE 529 MG/ML IV SOLN
15.0000 mL | Freq: Once | INTRAVENOUS | Status: AC | PRN
  Administered 2013-02-16: 15 mL via INTRAVENOUS

## 2013-02-16 MED ORDER — HYDROCODONE-ACETAMINOPHEN 5-325 MG PO TABS: 2.0000 | ORAL_TABLET | ORAL | Status: DC | PRN

## 2013-02-16 MED ORDER — NAPROXEN 375 MG PO TABS: 375.0000 mg | ORAL_TABLET | Freq: Two times a day (BID) | ORAL | Status: DC

## 2013-02-16 MED ORDER — ONDANSETRON HCL 4 MG/2ML IJ SOLN
4.0000 mg | Freq: Once | INTRAMUSCULAR | Status: AC
  Administered 2013-02-16: 4 mg via INTRAVENOUS
  Filled 2013-02-16: qty 2
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
## 2013-02-16 NOTE — ED Notes (Signed)
Woke up this am with right forearm pain and swelling. Denies injury other than fishing yesterday, denies recent wounds or bumps. Swelling noted to arm. +radial pulse.

## 2013-02-16 NOTE — ED Notes (Signed)
Patient returned from MRI.

## 2013-02-16 NOTE — ED Provider Notes (Signed)
Medical screening examination/treatment/procedure(s) were performed by non-physician practitioner and as supervising physician I was immediately available for consultation/collaboration.  Delyle Weider N Onell Mcmath, DO 02/16/13 1656 

## 2013-02-16 NOTE — ED Notes (Signed)
Ultrasound being done at bedside.

## 2013-02-16 NOTE — ED Notes (Signed)
Patient transported to MRI 
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
## 2013-02-16 NOTE — ED Provider Notes (Signed)
Transfer of care from [facility], [place], at change in shift.   7:54 PM Spoke with Dr. Janee Morn, hand surgeon, Dr. Janee Morn to see patient.  8:00 PM Re-assessed patient. Reported that pain is still consistent to the right midforearm. Swelling and mild erythema noted to the right forearm, extensor surface - negative warmth upon palpation. Pain upon palpation to the extensor surface. Patient able to flex and extend digits of the right hand. Full ROM to the right wrist with mild discomfort noted. Full ROM to the right elbow with mild discomfort noted. Negative pain out of proportion to exam. Doubt compartment syndrome.   8:35 PM Dr. Janee Morn called and reported that he is to come see and assess patient. Recommend anticoagulation lab since hematoma development without known cause - negative IV drug abuse, negative anticoagulation therapy.   Results for orders placed during the hospital encounter of 02/16/13  CBC WITH DIFFERENTIAL      Result Value Range   WBC 4.3  4.0 - 10.5 K/uL   RBC 4.42  4.22 - 5.81 MIL/uL   Hemoglobin 14.7  13.0 - 17.0 g/dL   HCT 16.1  09.6 - 04.5 %   MCV 93.4  78.0 - 100.0 fL   MCH 33.3  26.0 - 34.0 pg   MCHC 35.6  30.0 - 36.0 g/dL   RDW 40.9  81.1 - 91.4 %   Platelets 161  150 - 400 K/uL   Neutrophils Relative % 42 (*) 43 - 77 %   Neutro Abs 1.8  1.7 - 7.7 K/uL   Lymphocytes Relative 46  12 - 46 %   Lymphs Abs 2.0  0.7 - 4.0 K/uL   Monocytes Relative 10  3 - 12 %   Monocytes Absolute 0.4  0.1 - 1.0 K/uL   Eosinophils Relative 2  0 - 5 %   Eosinophils Absolute 0.1  0.0 - 0.7 K/uL   Basophils Relative 1  0 - 1 %   Basophils Absolute 0.0  0.0 - 0.1 K/uL  BASIC METABOLIC PANEL      Result Value Range   Sodium 138  135 - 145 mEq/L   Potassium 3.6  3.5 - 5.1 mEq/L   Chloride 102  96 - 112 mEq/L   CO2 21  19 - 32 mEq/L   Glucose, Bld 125 (*) 70 - 99 mg/dL   BUN 14  6 - 23 mg/dL   Creatinine, Ser 7.82  0.50 - 1.35 mg/dL   Calcium 9.3  8.4 - 95.6 mg/dL   GFR calc non  Af Amer >90  >90 mL/min   GFR calc Af Amer >90  >90 mL/min  SEDIMENTATION RATE      Result Value Range   Sed Rate 1  0 - 16 mm/hr  APTT      Result Value Range   aPTT 32  24 - 37 seconds  PROTIME-INR      Result Value Range   Prothrombin Time 13.5  11.6 - 15.2 seconds   INR 1.05  0.00 - 1.49   Mr Forearm Right Wo/w Cm  02/16/2013   *RADIOLOGY REPORT*  Clinical Data: Acute onset soft tissue swelling in the right forearm.  DVT of the right upper extremity.  MRI OF THE RIGHT FOREARM WITHOUT AND WITH CONTRAST  Technique:  Multiplanar, multisequence MR imaging was performed both before and after administration of intravenous contrast.  Contrast: 15mL MULTIHANCE GADOBENATE DIMEGLUMINE 529 MG/ML IV SOLN  Comparison: None.  Findings: There is an acute  hematoma in the flexor digitorum superficialis measuring 2.7 cm x 3.2 cm on axial imaging and 4 cm in the long axis of the forearm.  There is radiating edema in the flexor digitorum superficialis and flexor digitorum profundus muscles.  The extensor compartment and the mobile wad appear within normal limits.  Radius and ulna appear intact.  Vasculature is poorly evaluated (not ordered as MRA/MRV). Neurovascular bundles appear within normal limits.  IMPRESSION: Acute hematoma of the flexor digitorum superficialis muscle belly measuring 2.7 x 3.2 x 4 cm with associated inflammatory changes and subcutaneous edema.   Original Report Authenticated By: Andreas Newport, M.D.   Dr. Janee Morn recommended that is anticoagulant labs normal than patient to be discharged with NSAID and analgesic with outpatient followed-up.   APTT and INR within normal limits. Negative elevated sed rate. Hematoma noted to the right mid-forearm region. Patient stable, afebrile. Pain controlled in ED setting. Discharged patient with small dose of pain medications and NSAIDs for relief - discussed course, precautions, and disposal. Discussed with patient to rest and stay hydrated. Educated  patient on compartment syndrome symptoms and symptoms to watch out for. Referred patient to PCP and hand if pain continues. Discussed with patient to continue to monitor symptoms and if symptoms are to worsen or change to report back to the ED - strict return instructions given. Patient agreed to plan of care, understood, all questions answered.   Raymon Mutton, PA-C 02/17/13 [phone]

## 2013-02-16 NOTE — ED Notes (Signed)
Hand Surgery MD at bedside

## 2013-02-16 NOTE — ED Notes (Signed)
Called pt for triage with no answer  

## 2013-02-16 NOTE — Progress Notes (Signed)
VASCULAR LAB PRELIMINARY  PRELIMINARY  PRELIMINARY  PRELIMINARY  Right upper extremity venous Doppler completed.    Preliminary report:  There is non occlusive DVT noted in the right brachial and axillary veins of the right upper extremity, most likely secondary to swelling from structure in forearm.  [name], RVT 02/16/2013, 2:26 PM

## 2013-02-16 NOTE — ED Notes (Signed)
Called MRI. They stated he still had other patients in front of him, but that they were aware of him. Notified patient of delay.
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
## 2013-02-20 NOTE — ED Provider Notes (Signed)
Medical screening examination/treatment/procedure(s) were performed by non-physician practitioner and as supervising physician I was immediately available for consultation/collaboration.   Suzi Roots, MD 02/20/13 [phone]

## 2015-08-12 ENCOUNTER — Emergency Department (HOSPITAL_COMMUNITY)
Admission: EM | Admit: 2015-08-12 | Discharge: 2015-08-12 | Disposition: A | Discharge status: Home / Self Care | Payer: No Typology Code available for payment source | Attending: Emergency Medicine | Admitting: Emergency Medicine

## 2015-08-12 ENCOUNTER — Encounter (HOSPITAL_COMMUNITY): Payer: Self-pay | Admitting: Emergency Medicine

## 2015-08-12 ENCOUNTER — Encounter (HOSPITAL_COMMUNITY): Payer: Self-pay

## 2015-08-12 DIAGNOSIS — Z79899 Other long term (current) drug therapy: Secondary | ICD-10-CM | POA: Insufficient documentation

## 2015-08-12 DIAGNOSIS — R040 Epistaxis: Secondary | ICD-10-CM

## 2015-08-12 DIAGNOSIS — F329 Major depressive disorder, single episode, unspecified: Secondary | ICD-10-CM | POA: Insufficient documentation

## 2015-08-12 DIAGNOSIS — F17210 Nicotine dependence, cigarettes, uncomplicated: Secondary | ICD-10-CM | POA: Insufficient documentation

## 2015-08-12 DIAGNOSIS — Z791 Long term (current) use of non-steroidal anti-inflammatories (NSAID): Secondary | ICD-10-CM | POA: Insufficient documentation

## 2015-08-12 DIAGNOSIS — J3489 Other specified disorders of nose and nasal sinuses: Secondary | ICD-10-CM | POA: Insufficient documentation

## 2015-08-12 DIAGNOSIS — Z87828 Personal history of other (healed) physical injury and trauma: Secondary | ICD-10-CM | POA: Insufficient documentation

## 2015-08-12 DIAGNOSIS — R05 Cough: Secondary | ICD-10-CM | POA: Insufficient documentation

## 2015-08-12 MED ORDER — SILVER NITRATE-POT NITRATE 75-25 % EX MISC
1.0000 "application " | Freq: Once | CUTANEOUS | Status: AC
  Administered 2015-08-12: 1 via TOPICAL
  Filled 2015-08-12: qty 1

## 2015-08-12 MED ORDER — OXYMETAZOLINE HCL 0.05 % NA SOLN
1.0000 | Freq: Once | NASAL | Status: AC
  Administered 2015-08-12: 1 via NASAL
  Filled 2015-08-12: qty 15

## 2015-08-12 NOTE — ED Notes (Signed)
The patient was seen here for a nose bleed earlier and was discharged.  The patient said his nose started bleeding again.  He said he did not blow his nose or anything that it just starting bleeding.  The patient denies pain.

## 2015-08-12 NOTE — Discharge Instructions (Signed)
Humidify the air and apply bacitracin to the inside of the nose this will help prevent future nosebleeds. If the bleeding recurs spray Afrin into the nose and hold pressure for at least 10 minutes.  Do not hesitate to return to the emergency room for any new, worsening or concerning symptoms.  Please obtain primary care using resource guide below. Let them know that you were seen in the emergency room and that they will need to obtain records for further outpatient management.   Nosebleed Nosebleeds are common. They are due to a crack in the inside lining of your nose (mucous membrane) or from a small blood vessel that starts to bleed. Nosebleeds can be caused by many conditions, such as injury, infections, dry mucous membranes or dry climate, medicines, nose picking, and home heating and cooling systems. Most nosebleeds come from blood vessels in the front of your nose. HOME CARE INSTRUCTIONS   Try controlling your nosebleed by pinching your nostrils gently and continuously for at least 10 minutes.  Avoid blowing or sniffing your nose for a number of hours after having a nosebleed.  Do not put gauze inside your nose yourself. If your nose was packed by your health care provider, try to maintain the pack inside of your nose until your health care provider removes it.  If a gauze pack was used and it starts to fall out, gently replace it or cut off the end of it.  If a balloon catheter was used to pack your nose, do not cut or remove it unless your health care provider has instructed you to do that.  Avoid lying down while you are having a nosebleed. Sit up and lean forward.  Use a nasal spray decongestant to help with a nosebleed as directed by your health care provider.  Do not use petroleum jelly or mineral oil in your nose. These can drip into your lungs.  Maintain humidity in your home by using less air conditioning or by using a humidifier.  Aspirinand blood thinners make bleeding  more likely. If you are prescribed these medicines and you suffer from nosebleeds, ask your health care provider if you should stop taking the medicines or adjust the dose. Do not stop medicines unless directed by your health care provider  Resume your normal activities as you are able, but avoid straining, lifting, or bending at the waist for several days.  If your nosebleed was caused by dry mucous membranes, use over-the-counter saline nasal spray or gel. This will keep the mucous membranes moist and allow them to heal. If you must use a lubricant, choose the water-soluble variety. Use it only sparingly, and do not use it within several hours of lying down.  Keep all follow-up visits as directed by your health care provider. This is important. SEEK MEDICAL CARE IF:  You have a fever.  You get frequent nosebleeds.  You are getting nosebleeds more often. SEEK IMMEDIATE MEDICAL CARE IF:  Your nosebleed lasts longer than 20 minutes.  Your nosebleed occurs after an injury to your face, and your nose looks crooked or broken.  You have unusual bleeding from other parts of your body.  You have unusual bruising on other parts of your body.  You feel light-headed or you faint.  You become sweaty.  You vomit blood.  Your nosebleed occurs after a head injury.   This information is not intended to replace advice given to you by your health care provider. Make sure you discuss any questions  you have with your health care provider.   Document Released: 03/29/2005 Document Revised: 07/10/2014 Document Reviewed: 02/02/2014 Elsevier Interactive Patient Education 2016 [employer].   Emergency Department Resource Guide 1) Find a Doctor and Pay Out of Pocket Although you won't have to find out who is covered by your insurance plan, it is a good idea to ask around and get recommendations. You will then need to call the office and see if the doctor you have chosen will accept you as a new  patient and what types of options they offer for patients who are self-pay. Some doctors offer discounts or will set up payment plans for their patients who do not have insurance, but you will need to ask so you aren't surprised when you get to your appointment.  2) Contact Your Local Health Department Not all health departments have doctors that can see patients for sick visits, but many do, so it is worth a call to see if yours does. If you don't know where your local health department is, you can check in your phone book. The CDC also has a tool to help you locate your state's health department, and many state websites also have listings of all of their local health departments.  3) Find a [facility] If your illness is not likely to be very severe or complicated, you may want to try a walk in clinic. These are popping up all over the country in pharmacies, drugstores, and shopping centers. They're usually staffed by nurse practitioners or physician assistants that have been trained to treat common illnesses and complaints. They're usually fairly quick and inexpensive. However, if you have serious medical issues or chronic medical problems, these are probably not your best option.  No Primary Care Doctor: - Call Health Connect at  [phone] - they can help you locate a primary care doctor that  accepts your insurance, provides certain services, etc. - Physician Referral Service- [phone]  Chronic Pain Problems: Organization         Address  Phone   Notes  [facility]  [phone] Patients need to be referred by their primary care doctor.   Medication Assistance: Organization         Address  Phone   Notes  [facility] [address] [place], [place] [address] [phone] --Must be a resident of [facility] -- Must have NO insurance coverage whatsoever (no Medicaid/ Medicare, etc.) -- The pt. MUST have a primary  care doctor that directs their care regularly and follows them in the community   [facility]  [phone]   [employer]  [phone]    Agencies that provide inexpensive medical care: Organization         Address  Phone   Notes  [place]  [phone]   [facility]    [phone]   [facility] [place],  [address] [phone]   [place] [address] [place] [phone]   Planned Parenthood    [phone]   [facility]    [phone]   [place] and [place] [address], [place] Phone:  [phone], Fax:  [phone] Hours of Operation:  9 am - 6 pm, M-F.  Also accepts Medicaid/Medicare and self-pay.  [facility] for [place] [address], [address],  [place] Phone: [phone], Fax: [phone]. Hours of Operation:  8:30 am - 5:30 pm, M-F.  Also accepts Medicaid and self-pay.  [facility] [place] [address], [place] Phone: [phone]   [place], [place], [place] [phone], Ext. [phone] Mondays & Thursdays: 7-9 AM.  First 15 patients are seen on a first come, first serve basis.    [place] Providers:  Organization         Address  Phone   Notes  [facility] [address], [place] [phone] Also accepts self-pay patients.  [facility] [id number] [place], [place]  [phone]   [place], [address], [place] [phone]   [facility] [address] [place] [phone]   Lucianne Lei [address], [place]   [phone] Only accepts [place] Access [place] patients after they have their name applied to their card.   Self-Pay (no insurance) in [facility]:  Organization         Address  Phone   Notes  Sickle Cell Patients, [facility] [place] [phone]   [facility] [place] [phone]   [facility] [place]  [place], [place], [place] [phone]   [facility]/Dr. Osei-Bonsu  [address] [place] or [address], [place] [phone] Phone number for both [place] and [place] locations is the same.  Urgent Medical and [facility] [address], [place] [phone]   [facility] [address] [place] or [address] [phone]   [facility] [address] [place] [phone], phone; [phone], fax Sees patients 1st and 3rd Saturday of every month.  Must not qualify for public or private insurance (i.e. Medicaid, Medicare, [place] Health Choice, Veterans' Benefits)  Household income should be no more than 200% of the poverty level The clinic cannot treat you if you are pregnant or think you are pregnant  Sexually transmitted diseases are not treated at the clinic.    Dental Care: Organization         Address  Phone  Notes  [facility] Department of [facility] [place] [phone] Accepts children up to age 21 who are enrolled in [place] or [place]; pregnant women with a Medicaid card; and children who have applied for Medicaid or [place] Health Choice, but were declined, whose parents can pay a reduced fee at time of service.  [facility] Department of [facility]  [address], [place] [phone] Accepts children up to age 48 who are enrolled in [place] or [place]; pregnant women with a Medicaid card; and children who have applied for Medicaid or [place] Health Choice, but were declined, whose parents can  pay a reduced fee at time of service.  [place] Adult Dental Access PROGRAM  [place] [phone] Patients are seen by appointment only. Walk-ins are not accepted. [place] will see patients 11 years of age and older. Monday - Tuesday (8am-5pm) Most Wednesdays (8:30-5pm) $30 per visit, cash only  [place]  [address], [facility] [phone] Patients are seen by appointment only. Walk-ins are not accepted. [place] will see patients 42 years of age and older. One Wednesday Evening (Monthly: Volunteer Based).  $30 per visit, cash only  [place]  [phone] for adults; Children under age 69, call Graduate Pediatric Dentistry at [phone]. Children aged 66-14, please call [phone] to request a pediatric application.  Dental services are provided in all areas of dental care including fillings, crowns and bridges, complete and partial dentures, implants, gum treatment, root canals, and extractions. Preventive care is also provided. Treatment is provided to both adults and children. Patients are selected via a lottery and there is often a waiting list.   [facility] [address], [place]  [phone] [email or web address]   [facility] [address] [place], [place] [phone], Ext. [phone] Second and Fourth Thursday of each month, opens at 6:30 AM; Clinic ends at 9 AM.  Patients are seen on a first-come first-served basis, and a limited number are seen during each clinic.   [facility]  [address] Hillard Danker [place], [place] [phone]   Eligibility Requirements You must have lived in [place], [place], or [place] counties for at least the last three months.   You cannot be eligible for state or federal sponsored [employer], including [employer], [place], or [employer].   You generally cannot be eligible for healthcare  insurance through your employer.    How to apply: Eligibility screenings are held every Tuesday and Wednesday afternoon from 1:00 pm until 4:00 pm. You do not need an appointment for the interview!  [facility] [address] [place], [place]   [place]  [place] Department  [place]  [phone]    Behavioral Health Resources in the Community: Intensive Outpatient Programs Organization         Address  Phone  Notes  [place]. [address], [place], [place] [phone]   [facility] Outpatient [address] [place], [place]   ADS: Alcohol & Drug Svcs [address] [place], [place]   [place] [address],  [place], [place] or [phone]   Substance Abuse Resources Organization         Address  Phone  Notes  Alcohol and Drug Services  [phone]   [place]  [phone]   The [place]   Chinita Pester  [phone]   Residential & Outpatient Substance Abuse Program  [phone]   Psychological Services Organization         Address  Phone  Notes  [facility] [place]  [place]  [phone]   [place] [address], [place] or [phone]    Mobile Crisis Teams Organization         Address  Phone  Notes  [facility], Mobile Crisis Care Unit  [phone]   [facility]ive Psychotherapeutic Services  [address] [place], [place]   [place] [address] [place] [address], [place] [phone]    Self-Help/Support Groups Organization         Address  Phone             Notes  [place]. of [place] - variety of  support groups  336- ([phone] Call for more information  Narcotics Anonymous (NA),  [facility] [address], [employer] [place]  2 meetings at this location   Residential Facilities manager         Address  Phone  Notes  [facility] [place],    [place]  [phone]   [facility]  [address] [place] [id number], [place], [place]   [place], [place] [phone] Admissions: 8am-3pm M-F  Incentives Substance [place] [address],    [place], [place] [phone]   [facility] [address] [place] [address] [place], [place], [place]   [facility] [address]  [place], [place]   [facility] Programs - Intensive Outpatient [address], [place], [place]   [facility] [address] [place].,  [place], [place] [phone] or [phone]   [facility]idential Treatment Services (RTS) [address], [place], [place] Accepts Medicaid  [facility] [address],  [place] [phone] Substance Abuse/Addiction Treatment   [facility] Organization         Address  Phone  Notes  [facility]  [phone]   Domenic Schwab, PhD [address] Arlis [place], [place]   [phone] or [phone]   [place], [place] [phone]   [facility] [address], [place], [place] [phone] Insurance/Medicaid/sponsorship through [facility] and Families [address], [address] [place]                                    [place], [place] [phone] [place] [address], [place] [phone]    Dr. Adele Schilder  [phone]   [facility] Dept. 1) [address], [place] 2) [place] 3)  [place], [place] [phone]  [phone]    [place] [phone] or [phone] (After Hours)
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
## 2015-08-12 NOTE — ED Notes (Signed)
Patient here with right sided nose bleeds x 2 days, no bleeding on arrival. Patient reports that he is not taking lisinopril as prescribed
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
## 2015-08-12 NOTE — Discharge Instructions (Signed)

## 2015-08-12 NOTE — ED Notes (Signed)
Pt left at this time with all belongings.  

## 2015-08-13 ENCOUNTER — Encounter (HOSPITAL_COMMUNITY): Payer: Self-pay | Admitting: Nurse Practitioner

## 2015-08-13 ENCOUNTER — Emergency Department (HOSPITAL_COMMUNITY)
Admission: EM | Admit: 2015-08-13 | Discharge: 2015-08-13 | Disposition: A | Discharge status: Home / Self Care | Payer: No Typology Code available for payment source | Attending: Emergency Medicine | Admitting: Emergency Medicine

## 2015-08-13 DIAGNOSIS — F17210 Nicotine dependence, cigarettes, uncomplicated: Secondary | ICD-10-CM | POA: Insufficient documentation

## 2015-08-13 DIAGNOSIS — Z791 Long term (current) use of non-steroidal anti-inflammatories (NSAID): Secondary | ICD-10-CM | POA: Insufficient documentation

## 2015-08-13 DIAGNOSIS — F329 Major depressive disorder, single episode, unspecified: Secondary | ICD-10-CM | POA: Insufficient documentation

## 2015-08-13 DIAGNOSIS — R040 Epistaxis: Secondary | ICD-10-CM

## 2015-08-13 DIAGNOSIS — Z87828 Personal history of other (healed) physical injury and trauma: Secondary | ICD-10-CM | POA: Insufficient documentation

## 2015-08-13 DIAGNOSIS — Z79899 Other long term (current) drug therapy: Secondary | ICD-10-CM | POA: Insufficient documentation

## 2015-08-13 LAB — CBC WITH DIFFERENTIAL/PLATELET
Basophils Absolute: 0 10*3/uL (ref 0.0–0.1)
Basophils Relative: 1 %
EOS PCT: 1 %
Eosinophils Absolute: 0 10*3/uL (ref 0.0–0.7)
HEMATOCRIT: 39.5 % (ref 39.0–52.0)
Hemoglobin: 13 g/dL (ref 13.0–17.0)
LYMPHS ABS: 1.1 10*3/uL (ref 0.7–4.0)
LYMPHS PCT: 27 %
MCH: 32.2 pg (ref 26.0–34.0)
MCHC: 32.9 g/dL (ref 30.0–36.0)
MCV: 97.8 fL (ref 78.0–100.0)
MONO ABS: 0.6 10*3/uL (ref 0.1–1.0)
Monocytes Relative: 15 %
NEUTROS ABS: 2.4 10*3/uL (ref 1.7–7.7)
Neutrophils Relative %: 56 %
PLATELETS: 126 10*3/uL — AB (ref 150–400)
RBC: 4.04 MIL/uL — ABNORMAL LOW (ref 4.22–5.81)
RDW: 12.9 % (ref 11.5–15.5)
WBC: 4.2 10*3/uL (ref 4.0–10.5)
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
## 2015-08-13 NOTE — Discharge Instructions (Signed)
Nosebleed Nosebleeds are common. A nosebleed can be caused by many things, including:  Getting hit hard in the nose.  Infections.  Dryness in your nose.  A dry climate.  Medicines.  Picking your nose.  Your home heating and cooling systems. HOME CARE   Try controlling your nosebleed by pinching your nostrils gently. Do this for at least 10 minutes.  Avoid blowing or sniffing your nose for a number of hours after having a nosebleed.  Do not put gauze inside of your nose yourself. If your nose was packed by your doctor, try to keep the pack inside of your nose until your doctor removes it.  If a gauze pack was used and it starts to fall out, gently replace it or cut off the end of it.  If a balloon catheter was used to pack your nose, do not cut or remove it unless told by your doctor.  Avoid lying down while you are having a nosebleed. Sit up and lean forward.  Use a nasal spray decongestant to help with a nosebleed as told by your doctor.  Do not use petroleum jelly or mineral oil in your nose. These can drip into your lungs.  Keep your house humid by using:  Less air conditioning.  A humidifier.  Aspirin and blood thinners make bleeding more likely. If you are prescribed these medicines and you have nosebleeds, ask your doctor if you should stop taking the medicines or adjust the dose. Do not stop medicines unless told by your doctor.  Resume your normal activities as you are able. Avoid straining, lifting, or bending at your waist for several days.  If your nosebleed was caused by dryness in your nose, use over-the-counter saline nasal spray or gel. If you must use a lubricant:  Choose one that is water-soluble.  Use it only as needed.  Do not use it within several hours of lying down.  Keep all follow-up visits as told by your doctor. This is important. GET HELP IF:  You have a fever.  You get frequent nosebleeds.  You are getting nosebleeds more  often. GET HELP RIGHT AWAY IF:  Your nosebleed lasts longer than 20 minutes.  Your nosebleed occurs after an injury to your face, and your nose looks crooked or broken.  You have unusual bleeding from other parts of your body.  You have unusual bruising on other parts of your body.  You feel light-headed or dizzy.  You become sweaty.  You throw up (vomit) blood.  You have a nosebleed after a head injury.   This information is not intended to replace advice given to you by your health care provider. Make sure you discuss any questions you have with your health care provider.  Call ear nose and throat for follow-up on Monday. Keep the [employer] in place. If it starts to bleed again adjust the balloon as directed. If not controlled return.    Document Released: 03/28/2008 Document Revised: 07/10/2014 Document Reviewed: 02/02/2014 Elsevier Interactive Patient Education [employer].

## 2015-08-13 NOTE — ED Notes (Signed)
Pt c/o nosebleed for 3 days he is unable to control at home. He denies pain or injury. He has tissue in R nare now to control bleeding.

## 2016-08-22 ENCOUNTER — Emergency Department (HOSPITAL_COMMUNITY): Payer: Self-pay

## 2016-08-22 ENCOUNTER — Emergency Department (HOSPITAL_COMMUNITY)
Admission: EM | Admit: 2016-08-22 | Discharge: 2016-08-22 | Disposition: A | Discharge status: Home / Self Care | Payer: Self-pay | Attending: Emergency Medicine | Admitting: Emergency Medicine

## 2016-08-22 ENCOUNTER — Encounter (HOSPITAL_COMMUNITY): Payer: Self-pay

## 2016-08-22 DIAGNOSIS — F17210 Nicotine dependence, cigarettes, uncomplicated: Secondary | ICD-10-CM | POA: Insufficient documentation

## 2016-08-22 DIAGNOSIS — Z23 Encounter for immunization: Secondary | ICD-10-CM | POA: Insufficient documentation

## 2016-08-22 DIAGNOSIS — Z79899 Other long term (current) drug therapy: Secondary | ICD-10-CM | POA: Insufficient documentation

## 2016-08-22 DIAGNOSIS — L03011 Cellulitis of right finger: Secondary | ICD-10-CM | POA: Insufficient documentation

## 2016-08-22 MED ORDER — TETANUS-DIPHTH-ACELL PERTUSSIS 5-2.5-18.5 LF-MCG/0.5 IM SUSP
0.5000 mL | Freq: Once | INTRAMUSCULAR | Status: AC
  Administered 2016-08-22: 0.5 mL via INTRAMUSCULAR
  Filled 2016-08-22: qty 0.5

## 2016-08-22 MED ORDER — LIDOCAINE HCL (PF) 1 % IJ SOLN
5.0000 mL | Freq: Once | INTRAMUSCULAR | Status: AC
  Administered 2016-08-22: 5 mL via INTRADERMAL
  Filled 2016-08-22: qty 5

## 2016-08-22 MED ORDER — CLINDAMYCIN HCL 150 MG PO CAPS: 300.0000 mg | ORAL_CAPSULE | Freq: Three times a day (TID) | ORAL | 0 refills | Status: AC

## 2016-08-22 NOTE — ED Triage Notes (Signed)
Patient complains of right thumb pain and swelling x 5 days, thinks started after fishing on Thursday and cat fish may have stung him. Patient tried to drain the thumb and no discharge, denies fever

## 2016-08-22 NOTE — ED Notes (Addendum)
Pt reports he went cat fishing on Thursday and noticed his thumb starting to swell on Friday. Denies being stung by cat fish or stuck by a lure. He reports the thumb is throbbing and painful. Noticeable swelling only in left thumb. Pt reports having feeling in thumb.
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
## 2016-08-22 NOTE — ED Notes (Signed)
ED Provider at bedside. 

## 2017-01-19 ENCOUNTER — Ambulatory Visit (INDEPENDENT_AMBULATORY_CARE_PROVIDER_SITE_OTHER): Payer: Self-pay | Admitting: Internal Medicine

## 2017-01-19 VITALS — BP 136/79 | HR 71 | Temp 98.2°F | Wt 153.2 lb

## 2017-01-19 DIAGNOSIS — M79605 Pain in left leg: Secondary | ICD-10-CM

## 2017-01-19 DIAGNOSIS — I10 Essential (primary) hypertension: Secondary | ICD-10-CM

## 2017-01-19 MED ORDER — MELOXICAM 7.5 MG PO TABS: 7.5000 mg | ORAL_TABLET | Freq: Every day | ORAL | 0 refills | Status: DC

## 2017-01-19 MED ORDER — CYCLOBENZAPRINE HCL 5 MG PO TABS: 5.0000 mg | ORAL_TABLET | Freq: Three times a day (TID) | ORAL | 0 refills | Status: DC | PRN

## 2017-01-19 NOTE — Patient Instructions (Signed)
Mr. Agcaoili,  Please visit [facility] to follow-up for your medications. Please start taking meloxicam 1 pill daily for the next week and Flexeril up to 3 times as needed a day for your muscle spasms. Please follow up in one week to reassess the current medication regimen. A referral will be made to physical therapy. Please follow up with Mrs. Bonna Gains for the [facility] card.
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
## 2017-01-20 LAB — BMP8+ANION GAP
ANION GAP: 14 mmol/L (ref 10.0–18.0)
BUN/Creatinine Ratio: 16 (ref 9–20)
BUN: 12 mg/dL (ref 6–24)
CO2: 24 mmol/L (ref 20–29)
Calcium: 9.1 mg/dL (ref 8.7–10.2)
Chloride: 105 mmol/L (ref 96–106)
Creatinine, Ser: 0.74 mg/dL — ABNORMAL LOW (ref 0.76–1.27)
GFR, EST AFRICAN AMERICAN: 123 mL/min/{1.73_m2} (ref >59)
GFR, EST NON AFRICAN AMERICAN: 106 mL/min/{1.73_m2} (ref >59)
Glucose: 80 mg/dL (ref 65–99)
Potassium: 4.6 mmol/L (ref 3.5–5.2)
SODIUM: 143 mmol/L (ref 134–144)

## 2017-01-20 LAB — CBC
HEMOGLOBIN: 13.1 g/dL (ref 13.0–17.7)
Hematocrit: 38.9 % (ref 37.5–51.0)
MCH: 32.6 pg (ref 26.6–33.0)
MCHC: 33.7 g/dL (ref 31.5–35.7)
MCV: 97 fL (ref 79–97)
PLATELETS: 202 10*3/uL (ref 150–379)
RBC: 4.02 x10E6/uL — AB (ref 4.14–5.80)
RDW: 14.6 % (ref 12.3–15.4)
WBC: 6.2 10*3/uL (ref 3.4–10.8)

## 2017-01-20 LAB — LIPID PANEL
CHOL/HDL RATIO: 2.5 ratio (ref 0.0–5.0)
Cholesterol, Total: 155 mg/dL (ref 100–199)
HDL: 62 mg/dL (ref >39)
LDL CALC: 68 mg/dL (ref 0–99)
TRIGLYCERIDES: 125 mg/dL (ref 0–149)
VLDL Cholesterol Cal: 25 mg/dL (ref 5–40)

## 2017-01-21 DIAGNOSIS — M79605 Pain in left leg: Secondary | ICD-10-CM | POA: Insufficient documentation

## 2017-01-21 DIAGNOSIS — I10 Essential (primary) hypertension: Secondary | ICD-10-CM | POA: Insufficient documentation
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
## 2017-01-21 NOTE — Assessment & Plan Note (Addendum)
Assessment: Essential hypertension Patient states he hasn't taken lisinopril in several weeks and when he does have the medication he states he doesn't take it daily.  Patient's blood pressure is 136/79 and stable.   Will get lipid panel today to calculate ASCVD risk to better assess starting blood pressure medication   Plan -monitor blood pressure  -BMET, CBC and lipid profile  Addendum: ASCVD risk is 5.7%.  No recommendation for statin or aspirin.  At this time will not start blood pressure medication.  Reassess blood pressure at subsequent visits

## 2017-01-22 NOTE — Progress Notes (Signed)
Internal Medicine Clinic Attending  Case discussed with Dr. Hoffman at the time of the visit.  We reviewed the resident's history and exam and pertinent patient test results.  I agree with the assessment, diagnosis, and plan of care documented in the resident's note.  

## 2017-01-30 ENCOUNTER — Ambulatory Visit: Payer: Self-pay

## 2017-02-05 ENCOUNTER — Encounter: Payer: Self-pay | Admitting: Internal Medicine

## 2017-02-05 ENCOUNTER — Ambulatory Visit (INDEPENDENT_AMBULATORY_CARE_PROVIDER_SITE_OTHER): Payer: Self-pay | Admitting: Internal Medicine

## 2017-02-05 VITALS — BP 136/81 | HR 70 | Temp 98.3°F | Wt 153.0 lb

## 2017-02-05 DIAGNOSIS — M79605 Pain in left leg: Secondary | ICD-10-CM

## 2017-02-05 MED ORDER — CYCLOBENZAPRINE HCL 5 MG PO TABS
5.0000 mg | ORAL_TABLET | Freq: Three times a day (TID) | ORAL | 0 refills | Status: DC | PRN
Start: 2017-02-05 — End: 2017-02-16

## 2017-02-05 MED ORDER — MELOXICAM 7.5 MG PO TABS: 7.5000 mg | ORAL_TABLET | Freq: Every day | ORAL | 1 refills | Status: DC
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
## 2017-02-05 NOTE — Assessment & Plan Note (Signed)
Leg pain has significantly improved with the use of meloxicam and cyclobenzaprine. Will see back in 2 weeks after he has gotten his orange card. He is interested in PT.  Plan: - Begin alternating Meloxicam and Tylenol  - Begin taking meloxicam with food - Continue using the cyclobenzaprine PRN HS - Provide stretches of the Hamstrings and Quads  - PT referral after he has gotten his orange card

## 2017-02-05 NOTE — Patient Instructions (Addendum)
It was a pleasure to meet you. Today we did the following:  - Consider alternating between the Meloxicam and tylenol   - You can take the Meloxicam 2 times daily   - Do not take more that 3000 mg of Tylenol per day   - Sent out your prescriptions to your pharmacy   - Follow-up with Bonna Gains to get your orange card   - Follow-up with [place] in 2 weeks for a physical therapy referral

## 2017-02-08 NOTE — Progress Notes (Signed)
Internal Medicine Clinic Attending  I saw and evaluated the patient.  I personally confirmed the key portions of the history and exam documented by Dr. Helberg and I reviewed pertinent patient test results.  The assessment, diagnosis, and plan were formulated together and I agree with the documentation in the resident's note. 

## 2017-02-16 ENCOUNTER — Ambulatory Visit (INDEPENDENT_AMBULATORY_CARE_PROVIDER_SITE_OTHER): Payer: Self-pay | Admitting: Internal Medicine

## 2017-02-16 ENCOUNTER — Ambulatory Visit: Payer: Self-pay

## 2017-02-16 VITALS — BP 123/76 | HR 64 | Temp 97.8°F | Ht 67.0 in | Wt 153.4 lb

## 2017-02-16 DIAGNOSIS — M62838 Other muscle spasm: Secondary | ICD-10-CM

## 2017-02-16 DIAGNOSIS — M79605 Pain in left leg: Secondary | ICD-10-CM

## 2017-02-16 DIAGNOSIS — Z87828 Personal history of other (healed) physical injury and trauma: Secondary | ICD-10-CM

## 2017-02-16 DIAGNOSIS — Z8781 Personal history of (healed) traumatic fracture: Secondary | ICD-10-CM

## 2017-02-16 DIAGNOSIS — M25562 Pain in left knee: Secondary | ICD-10-CM

## 2017-02-16 DIAGNOSIS — G8921 Chronic pain due to trauma: Secondary | ICD-10-CM

## 2017-02-16 DIAGNOSIS — M545 Low back pain: Secondary | ICD-10-CM

## 2017-02-16 DIAGNOSIS — Z79899 Other long term (current) drug therapy: Secondary | ICD-10-CM

## 2017-02-16 DIAGNOSIS — M79652 Pain in left thigh: Secondary | ICD-10-CM

## 2017-02-16 MED ORDER — CYCLOBENZAPRINE HCL 5 MG PO TABS: 5.0000 mg | ORAL_TABLET | Freq: Three times a day (TID) | ORAL | 0 refills | Status: DC | PRN
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
## 2017-02-16 NOTE — Patient Instructions (Addendum)
[name] it was nice meeting you today.  -I have referred you to physical therapy.  -Continue taking Meloxicam 7.5 mg daily  -Continue taking Cyclobenzaprine 5 mg three times a day as needed  -Continue doing stretching exercises
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
## 2017-02-20 NOTE — Progress Notes (Signed)
Internal Medicine Clinic Attending  Case discussed with Dr. Rathoreat the time of the visit. We reviewed the resident's history and exam and pertinent patient test results. I agree with the assessment, diagnosis, and plan of care documented in the resident's note.  

## 2017-02-20 NOTE — Addendum Note (Signed)
Addended by: Hulan Fray on: 02/20/2017 05:31 PM   Modules accepted: Orders

## 2017-02-27 ENCOUNTER — Encounter: Payer: Self-pay | Admitting: *Deleted

## 2017-03-21 ENCOUNTER — Ambulatory Visit: Payer: Self-pay | Attending: Cardiology | Admitting: Physical Therapy

## 2017-03-21 ENCOUNTER — Encounter: Payer: Self-pay | Admitting: Physical Therapy

## 2017-03-21 DIAGNOSIS — M25552 Pain in left hip: Secondary | ICD-10-CM | POA: Insufficient documentation

## 2017-03-21 DIAGNOSIS — R262 Difficulty in walking, not elsewhere classified: Secondary | ICD-10-CM | POA: Insufficient documentation

## 2017-03-21 DIAGNOSIS — M545 Low back pain, unspecified: Secondary | ICD-10-CM

## 2017-03-21 DIAGNOSIS — G8929 Other chronic pain: Secondary | ICD-10-CM | POA: Insufficient documentation

## 2017-03-22 ENCOUNTER — Encounter: Payer: Self-pay | Admitting: Physical Therapy
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
## 2017-03-28 ENCOUNTER — Ambulatory Visit: Payer: Self-pay | Admitting: Physical Therapy

## 2017-03-28 ENCOUNTER — Encounter: Payer: Self-pay | Admitting: Physical Therapy

## 2017-03-28 DIAGNOSIS — R262 Difficulty in walking, not elsewhere classified: Secondary | ICD-10-CM

## 2017-03-28 DIAGNOSIS — M545 Low back pain, unspecified: Secondary | ICD-10-CM

## 2017-03-28 DIAGNOSIS — M25552 Pain in left hip: Secondary | ICD-10-CM

## 2017-03-28 DIAGNOSIS — G8929 Other chronic pain: Secondary | ICD-10-CM
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
## 2017-03-29 ENCOUNTER — Encounter: Payer: Self-pay | Admitting: Physical Therapy

## 2017-04-02 ENCOUNTER — Ambulatory Visit: Payer: Self-pay | Attending: Cardiology | Admitting: Physical Therapy

## 2017-04-02 ENCOUNTER — Encounter: Payer: Self-pay | Admitting: Physical Therapy

## 2017-04-02 DIAGNOSIS — G8929 Other chronic pain: Secondary | ICD-10-CM | POA: Insufficient documentation

## 2017-04-02 DIAGNOSIS — R262 Difficulty in walking, not elsewhere classified: Secondary | ICD-10-CM | POA: Insufficient documentation

## 2017-04-02 DIAGNOSIS — M545 Low back pain: Secondary | ICD-10-CM | POA: Insufficient documentation

## 2017-04-02 DIAGNOSIS — M25552 Pain in left hip: Secondary | ICD-10-CM | POA: Insufficient documentation
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
## 2017-04-04 ENCOUNTER — Encounter: Payer: Self-pay | Admitting: Physical Therapy

## 2017-04-10 ENCOUNTER — Ambulatory Visit: Payer: Self-pay | Admitting: Physical Therapy

## 2017-04-10 DIAGNOSIS — M25552 Pain in left hip: Secondary | ICD-10-CM

## 2017-04-10 DIAGNOSIS — R262 Difficulty in walking, not elsewhere classified: Secondary | ICD-10-CM

## 2017-04-10 DIAGNOSIS — M545 Low back pain, unspecified: Secondary | ICD-10-CM

## 2017-04-10 DIAGNOSIS — G8929 Other chronic pain: Secondary | ICD-10-CM
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
## 2017-04-12 ENCOUNTER — Encounter: Payer: Self-pay | Admitting: Physical Therapy

## 2017-04-12 ENCOUNTER — Ambulatory Visit: Payer: Self-pay | Admitting: Physical Therapy

## 2017-04-12 DIAGNOSIS — R262 Difficulty in walking, not elsewhere classified: Secondary | ICD-10-CM

## 2017-04-12 DIAGNOSIS — M25552 Pain in left hip: Secondary | ICD-10-CM

## 2017-04-12 DIAGNOSIS — M545 Low back pain, unspecified: Secondary | ICD-10-CM

## 2017-04-12 DIAGNOSIS — G8929 Other chronic pain: Secondary | ICD-10-CM
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
## 2017-04-16 ENCOUNTER — Ambulatory Visit: Payer: Self-pay | Admitting: Physical Therapy

## 2017-04-18 ENCOUNTER — Ambulatory Visit: Payer: Self-pay | Admitting: Physical Therapy

## 2017-04-30 ENCOUNTER — Ambulatory Visit: Payer: Self-pay | Admitting: Physical Therapy

## 2017-05-02 ENCOUNTER — Ambulatory Visit: Payer: Self-pay | Admitting: Physical Therapy

## 2017-05-02 DIAGNOSIS — M545 Low back pain, unspecified: Secondary | ICD-10-CM

## 2017-05-02 DIAGNOSIS — M25552 Pain in left hip: Secondary | ICD-10-CM

## 2017-05-02 DIAGNOSIS — G8929 Other chronic pain: Secondary | ICD-10-CM

## 2017-05-02 DIAGNOSIS — R262 Difficulty in walking, not elsewhere classified: Secondary | ICD-10-CM
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
## 2017-05-02 NOTE — Patient Instructions (Signed)
Abdominal isometrics.  Sitting with good posture and hands on knees,  Breath in to prepare Breath out as you press both hands into knees, Both hans and ,  Single hands.     10 x each

## 2017-05-08 ENCOUNTER — Ambulatory Visit: Payer: Self-pay | Attending: Cardiology | Admitting: Physical Therapy

## 2017-05-08 ENCOUNTER — Encounter: Payer: Self-pay | Admitting: Physical Therapy

## 2017-05-08 DIAGNOSIS — M25552 Pain in left hip: Secondary | ICD-10-CM | POA: Insufficient documentation

## 2017-05-08 DIAGNOSIS — R262 Difficulty in walking, not elsewhere classified: Secondary | ICD-10-CM | POA: Insufficient documentation

## 2017-05-08 DIAGNOSIS — G8929 Other chronic pain: Secondary | ICD-10-CM | POA: Insufficient documentation

## 2017-05-08 DIAGNOSIS — M545 Low back pain: Secondary | ICD-10-CM | POA: Insufficient documentation
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
## 2017-05-10 ENCOUNTER — Ambulatory Visit: Payer: Self-pay | Admitting: Physical Therapy

## 2017-05-15 ENCOUNTER — Ambulatory Visit: Payer: Self-pay | Admitting: Physical Therapy

## 2017-05-17 ENCOUNTER — Encounter: Payer: Self-pay | Admitting: Physical Therapy

## 2017-05-31 ENCOUNTER — Encounter: Payer: Self-pay | Admitting: Internal Medicine

## 2017-06-14 ENCOUNTER — Ambulatory Visit: Payer: Self-pay

## 2017-07-05 ENCOUNTER — Ambulatory Visit: Payer: Self-pay

## 2017-07-26 ENCOUNTER — Ambulatory Visit: Payer: Self-pay

## 2017-07-27 ENCOUNTER — Ambulatory Visit: Payer: Self-pay

## 2017-08-02 ENCOUNTER — Ambulatory Visit: Payer: Self-pay

## 2017-09-06 ENCOUNTER — Ambulatory Visit: Payer: Self-pay

## 2017-12-24 ENCOUNTER — Ambulatory Visit (HOSPITAL_COMMUNITY)
Admission: RE | Admit: 2017-12-24 | Discharge: 2017-12-24 | Disposition: A | Discharge status: Home / Self Care | Payer: Self-pay | Source: Ambulatory Visit | Attending: Internal Medicine | Admitting: Internal Medicine

## 2017-12-24 ENCOUNTER — Ambulatory Visit (INDEPENDENT_AMBULATORY_CARE_PROVIDER_SITE_OTHER): Payer: Self-pay | Admitting: Internal Medicine

## 2017-12-24 ENCOUNTER — Other Ambulatory Visit: Payer: Self-pay

## 2017-12-24 VITALS — BP 139/83 | HR 81 | Temp 98.3°F | Ht 67.0 in | Wt 149.5 lb

## 2017-12-24 DIAGNOSIS — G8921 Chronic pain due to trauma: Secondary | ICD-10-CM

## 2017-12-24 DIAGNOSIS — Z8781 Personal history of (healed) traumatic fracture: Secondary | ICD-10-CM

## 2017-12-24 DIAGNOSIS — M79605 Pain in left leg: Secondary | ICD-10-CM

## 2017-12-24 DIAGNOSIS — Z79899 Other long term (current) drug therapy: Secondary | ICD-10-CM

## 2017-12-24 DIAGNOSIS — Z87828 Personal history of other (healed) physical injury and trauma: Secondary | ICD-10-CM

## 2017-12-24 DIAGNOSIS — R1032 Left lower quadrant pain: Secondary | ICD-10-CM | POA: Insufficient documentation

## 2017-12-24 DIAGNOSIS — Z791 Long term (current) use of non-steroidal anti-inflammatories (NSAID): Secondary | ICD-10-CM

## 2017-12-24 DIAGNOSIS — M62838 Other muscle spasm: Secondary | ICD-10-CM

## 2017-12-24 MED ORDER — DULOXETINE HCL 30 MG PO CPEP: ORAL_CAPSULE | ORAL | 0 refills | Status: DC

## 2017-12-24 MED ORDER — CYCLOBENZAPRINE HCL 5 MG PO TABS: 5.0000 mg | ORAL_TABLET | Freq: Three times a day (TID) | ORAL | 0 refills | Status: DC | PRN

## 2017-12-24 MED FILL — DULoxetine HCL 30 MG CPEP: 30 | 30 days supply | Qty: 60 | Fill #0

## 2017-12-24 MED FILL — CYCLOBENZAPRINE 5 MG TABLET: 5 | 10 days supply | Qty: 30 | Fill #0
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
## 2017-12-24 NOTE — Patient Instructions (Addendum)
[name],  I want to get an XRay of your hip to make sure that isn't causing any issues. I am also going to get you started on a new medication called Duloxetine that may help with the pain. Please be careful about taking NSAIDs and follow the directions on the bottle.  Please schedule follow up with Dr. Tarri Abernethy for next month.

## 2017-12-25 LAB — BMP8+ANION GAP
ANION GAP: 16 mmol/L (ref 10.0–18.0)
BUN/Creatinine Ratio: 21 — ABNORMAL HIGH (ref 9–20)
BUN: 13 mg/dL (ref 6–24)
CO2: 22 mmol/L (ref 20–29)
Calcium: 9.4 mg/dL (ref 8.7–10.2)
Chloride: 101 mmol/L (ref 96–106)
Creatinine, Ser: 0.62 mg/dL — ABNORMAL LOW (ref 0.76–1.27)
GFR calc Af Amer: 131 mL/min/{1.73_m2} (ref >59)
GFR calc non Af Amer: 113 mL/min/{1.73_m2} (ref >59)
Glucose: 138 mg/dL — ABNORMAL HIGH (ref 65–99)
POTASSIUM: 4 mmol/L (ref 3.5–5.2)
SODIUM: 139 mmol/L (ref 134–144)
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
## 2017-12-26 NOTE — Progress Notes (Signed)
Internal Medicine Clinic Attending  Case discussed with Dr. Boswell at the time of the visit.  We reviewed the resident's history and exam and pertinent patient test results.  I agree with the assessment, diagnosis, and plan of care documented in the resident's note.  

## 2018-01-17 ENCOUNTER — Encounter: Payer: Self-pay | Admitting: Internal Medicine

## 2018-01-23 ENCOUNTER — Ambulatory Visit: Payer: Self-pay

## 2018-02-07 ENCOUNTER — Ambulatory Visit: Payer: Self-pay

## 2018-03-14 ENCOUNTER — Ambulatory Visit (INDEPENDENT_AMBULATORY_CARE_PROVIDER_SITE_OTHER): Payer: Self-pay | Admitting: Internal Medicine

## 2018-03-14 ENCOUNTER — Other Ambulatory Visit: Payer: Self-pay

## 2018-03-14 ENCOUNTER — Encounter (INDEPENDENT_AMBULATORY_CARE_PROVIDER_SITE_OTHER): Payer: Self-pay

## 2018-03-14 ENCOUNTER — Encounter: Payer: Self-pay | Admitting: Internal Medicine

## 2018-03-14 ENCOUNTER — Ambulatory Visit: Payer: Self-pay

## 2018-03-14 VITALS — BP 132/85 | HR 75 | Temp 98.8°F | Ht 67.0 in | Wt 148.0 lb

## 2018-03-14 DIAGNOSIS — D649 Anemia, unspecified: Secondary | ICD-10-CM

## 2018-03-14 DIAGNOSIS — F3342 Major depressive disorder, recurrent, in full remission: Secondary | ICD-10-CM

## 2018-03-14 DIAGNOSIS — Z791 Long term (current) use of non-steroidal anti-inflammatories (NSAID): Secondary | ICD-10-CM

## 2018-03-14 DIAGNOSIS — F325 Major depressive disorder, single episode, in full remission: Secondary | ICD-10-CM

## 2018-03-14 DIAGNOSIS — F17210 Nicotine dependence, cigarettes, uncomplicated: Secondary | ICD-10-CM

## 2018-03-14 DIAGNOSIS — D539 Nutritional anemia, unspecified: Secondary | ICD-10-CM | POA: Insufficient documentation

## 2018-03-14 DIAGNOSIS — F17200 Nicotine dependence, unspecified, uncomplicated: Secondary | ICD-10-CM

## 2018-03-14 DIAGNOSIS — Z79899 Other long term (current) drug therapy: Secondary | ICD-10-CM

## 2018-03-14 DIAGNOSIS — M79605 Pain in left leg: Secondary | ICD-10-CM

## 2018-03-14 DIAGNOSIS — G8921 Chronic pain due to trauma: Secondary | ICD-10-CM

## 2018-03-14 MED ORDER — CYCLOBENZAPRINE HCL 5 MG PO TABS: 5.0000 mg | ORAL_TABLET | Freq: Three times a day (TID) | ORAL | 0 refills | Status: DC | PRN

## 2018-03-14 MED ORDER — DULOXETINE HCL 60 MG PO CPEP: 60.0000 mg | ORAL_CAPSULE | Freq: Every day | ORAL | 1 refills | Status: DC

## 2018-03-14 MED ORDER — GABAPENTIN 300 MG PO CAPS: ORAL_CAPSULE | ORAL | 1 refills | Status: DC

## 2018-03-14 NOTE — Progress Notes (Signed)
Patient ID: [name], male   DOB: 28-Nov-1965, 52 y.o.   MRN: [id number]  Case discussed with Dr. Tarri Abernethy at the time of the visit.  We reviewed the resident's history and exam and pertinent patient test results.  I agree with the assessment, diagnosis and plan of care documented in the resident's note.

## 2018-03-14 NOTE — Assessment & Plan Note (Signed)
Patient is doing well today. He has MDD currently in remission. He is on duloxetine 60 mg QD without side effects. He does need a refill. Refill sent.

## 2018-03-14 NOTE — Patient Instructions (Signed)
Thank you for allowing [place] to provide your care. I have sent out your refills. We have started gabapentin 600 mg at bedtime and 300 mg in the morning. Take this for a week and if your pain is not improving increase as outlined below:  Week 1: 300 mg in the morning then 600 mg at bedtime  Week 2: 300 mg in the morning, 300 mg at lunch, and 600 mg at bedtime  Week 3: 300 mg in the morning, 300 mg at lunch, and 900 mg at bedtime Week 4: 600 mg in the morning, 300 mg at lunch, and 900 mg at bedtime Week 5: 600 mg in the morning, 600 mg at lunch, and 900 mg at bedtime  Do not increase the dose if it is controlling your pain. Please come back in 3 months.
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
## 2018-03-14 NOTE — Addendum Note (Signed)
Addended by: Oval Linsey D on: 03/14/2018 05:23 PM   Modules accepted: Level of Service

## 2018-03-14 NOTE — Assessment & Plan Note (Signed)
Patient continuing to smoke 1/2 per day. Encouraged cessation. Discussed available options including gum, patches, medications. Patient would like to stop. Given the  stop smoking help line.
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
## 2018-03-14 NOTE — Assessment & Plan Note (Signed)
Patient donating blood and plasma twice weekly for the past year. Has recently been turned away for anemia. Recommended further evaluation. Will check CBC and BMP today. Told not to donate any more plasma or blood for the next couple of weeks.

## 2018-03-15 LAB — CBC
HEMOGLOBIN: 11.9 g/dL — AB (ref 13.0–17.7)
Hematocrit: 34.8 % — ABNORMAL LOW (ref 37.5–51.0)
MCH: 33.2 pg — ABNORMAL HIGH (ref 26.6–33.0)
MCHC: 34.2 g/dL (ref 31.5–35.7)
MCV: 97 fL (ref 79–97)
Platelets: 142 10*3/uL — ABNORMAL LOW (ref 150–450)
RBC: 3.58 x10E6/uL — ABNORMAL LOW (ref 4.14–5.80)
RDW: 14 % (ref 12.3–15.4)
WBC: 6.1 10*3/uL (ref 3.4–10.8)

## 2018-03-15 LAB — BMP8+ANION GAP
Anion Gap: 14 mmol/L (ref 10.0–18.0)
BUN / CREAT RATIO: 18 (ref 9–20)
BUN: 13 mg/dL (ref 6–24)
CALCIUM: 9.6 mg/dL (ref 8.7–10.2)
CHLORIDE: 98 mmol/L (ref 96–106)
CO2: 27 mmol/L (ref 20–29)
CREATININE: 0.72 mg/dL — AB (ref 0.76–1.27)
GFR calc non Af Amer: 107 mL/min/{1.73_m2} (ref >59)
GFR, EST AFRICAN AMERICAN: 123 mL/min/{1.73_m2} (ref >59)
Glucose: 63 mg/dL — ABNORMAL LOW (ref 65–99)
Potassium: 4.4 mmol/L (ref 3.5–5.2)
Sodium: 139 mmol/L (ref 134–144)

## 2018-03-27 ENCOUNTER — Encounter (HOSPITAL_COMMUNITY): Payer: Self-pay | Admitting: Neurology

## 2018-03-27 ENCOUNTER — Emergency Department (HOSPITAL_COMMUNITY): Payer: Self-pay

## 2018-03-27 ENCOUNTER — Emergency Department (HOSPITAL_COMMUNITY)
Admission: EM | Admit: 2018-03-27 | Discharge: 2018-03-27 | Disposition: A | Discharge status: Home / Self Care | Payer: Self-pay | Attending: Emergency Medicine | Admitting: Emergency Medicine

## 2018-03-27 DIAGNOSIS — R0602 Shortness of breath: Secondary | ICD-10-CM | POA: Insufficient documentation

## 2018-03-27 DIAGNOSIS — R0789 Other chest pain: Secondary | ICD-10-CM | POA: Insufficient documentation

## 2018-03-27 DIAGNOSIS — F17210 Nicotine dependence, cigarettes, uncomplicated: Secondary | ICD-10-CM | POA: Insufficient documentation

## 2018-03-27 DIAGNOSIS — Z79899 Other long term (current) drug therapy: Secondary | ICD-10-CM | POA: Insufficient documentation

## 2018-03-27 DIAGNOSIS — I10 Essential (primary) hypertension: Secondary | ICD-10-CM | POA: Insufficient documentation

## 2018-03-27 LAB — BASIC METABOLIC PANEL
ANION GAP: 12 (ref 5–15)
BUN: 7 mg/dL (ref 6–20)
CALCIUM: 9 mg/dL (ref 8.9–10.3)
CO2: 27 mmol/L (ref 22–32)
CREATININE: 0.82 mg/dL (ref 0.61–1.24)
Chloride: 102 mmol/L (ref 98–111)
GFR calc non Af Amer: >60 mL/min (ref >60)
Glucose, Bld: 94 mg/dL (ref 70–99)
Potassium: 3.6 mmol/L (ref 3.5–5.1)
Sodium: 141 mmol/L (ref 135–145)

## 2018-03-27 LAB — I-STAT TROPONIN, ED
TROPONIN I, POC: 0.01 ng/mL (ref 0.00–0.08)
TROPONIN I, POC: 0 ng/mL (ref 0.00–0.08)

## 2018-03-27 LAB — D-DIMER, QUANTITATIVE (NOT AT ARMC): D DIMER QUANT: 0.34 ug{FEU}/mL (ref 0.00–0.50)

## 2018-03-27 LAB — CBC
HCT: 40.5 % (ref 39.0–52.0)
Hemoglobin: 13.2 g/dL (ref 13.0–17.0)
MCH: 33.8 pg (ref 26.0–34.0)
MCHC: 32.6 g/dL (ref 30.0–36.0)
MCV: 103.6 fL — ABNORMAL HIGH (ref 78.0–100.0)
PLATELETS: 165 10*3/uL (ref 150–400)
RBC: 3.91 MIL/uL — AB (ref 4.22–5.81)
RDW: 15 % (ref 11.5–15.5)
WBC: 7.2 10*3/uL (ref 4.0–10.5)

## 2018-03-27 MED ORDER — ONDANSETRON HCL 4 MG/2ML IJ SOLN
4.0000 mg | Freq: Once | INTRAMUSCULAR | Status: AC
  Administered 2018-03-27: 4 mg via INTRAVENOUS
  Filled 2018-03-27: qty 2

## 2018-03-27 MED ORDER — ALBUTEROL SULFATE HFA 108 (90 BASE) MCG/ACT IN AERS
2.0000 | INHALATION_SPRAY | RESPIRATORY_TRACT | Status: DC | PRN
  Administered 2018-03-27: 2 via RESPIRATORY_TRACT
  Filled 2018-03-27: qty 6.7

## 2018-03-27 MED ORDER — GI COCKTAIL ~~LOC~~
30.0000 mL | Freq: Once | ORAL | Status: AC
  Administered 2018-03-27: 30 mL via ORAL
  Filled 2018-03-27: qty 30

## 2018-03-27 MED ORDER — MORPHINE SULFATE (PF) 4 MG/ML IV SOLN
4.0000 mg | Freq: Once | INTRAVENOUS | Status: AC
  Administered 2018-03-27: 4 mg via INTRAVENOUS
  Filled 2018-03-27: qty 1

## 2018-03-27 MED ORDER — IOPAMIDOL (ISOVUE-370) INJECTION 76%
100.0000 mL | Freq: Once | INTRAVENOUS | Status: AC
  Administered 2018-03-27: 65 mL via INTRAVENOUS

## 2018-03-27 MED ORDER — IPRATROPIUM BROMIDE 0.02 % IN SOLN
0.5000 mg | Freq: Once | RESPIRATORY_TRACT | Status: AC
  Administered 2018-03-27: 0.5 mg via RESPIRATORY_TRACT
  Filled 2018-03-27: qty 2.5

## 2018-03-27 MED ORDER — IBUPROFEN 600 MG PO TABS: 600.0000 mg | ORAL_TABLET | Freq: Every day | ORAL | 0 refills | Status: DC | PRN

## 2018-03-27 MED ORDER — ALBUTEROL SULFATE (2.5 MG/3ML) 0.083% IN NEBU
5.0000 mg | INHALATION_SOLUTION | Freq: Once | RESPIRATORY_TRACT | Status: AC
  Administered 2018-03-27: 5 mg via RESPIRATORY_TRACT
  Filled 2018-03-27: qty 6

## 2018-03-27 MED ORDER — IOPAMIDOL (ISOVUE-370) INJECTION 76%
INTRAVENOUS | Status: AC
  Filled 2018-03-27: qty 100

## 2018-03-27 MED FILL — IBUPROFEN 600 MG TABLET: 600 | 30 days supply | Qty: 30 | Fill #0

## 2018-03-27 NOTE — Discharge Instructions (Signed)
You have been evaluated for your chest discomfort and shortness of breath.  Use albuterol inhaler 2 puffs every 4 hours as needed for shortness of breath.  Take ibuprofen as needed for pain.  Call and follow up closely with your doctor for further management of your condition.  Return if you have any concerns.

## 2018-03-27 NOTE — ED Notes (Signed)
Patient returned from CT
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
## 2018-03-27 NOTE — ED Notes (Signed)
Patient transported to X-ray 

## 2018-03-27 NOTE — ED Notes (Signed)
Pt's oxygen saturation fluctuated between 95% and 100% on room air while ambulating in hallway

## 2018-03-27 NOTE — ED Triage Notes (Signed)
Pt reports cp, sob since 11 pm last night. No radiation, denies n/v. Denies cardiac hx

## 2018-03-28 ENCOUNTER — Telehealth: Payer: Self-pay | Admitting: Internal Medicine

## 2018-04-17 MED FILL — CYCLOBENZAPRINE 5 MG TABLET: 5 | 30 days supply | Qty: 90 | Fill #0

## 2018-04-17 MED FILL — GABAPENTIN 300 MG CAPSULE: 300 | 30 days supply | Qty: 90 | Fill #0

## 2018-04-17 MED FILL — DULoxetine HCL 60 MG CPEP: 60 | 30 days supply | Qty: 30 | Fill #0

## 2018-04-24 ENCOUNTER — Telehealth: Payer: Self-pay | Admitting: Internal Medicine

## 2018-04-24 NOTE — Telephone Encounter (Signed)
Pt missed call, please return [phone]

## 2018-10-03 ENCOUNTER — Other Ambulatory Visit: Payer: Self-pay | Admitting: Internal Medicine

## 2018-10-03 ENCOUNTER — Other Ambulatory Visit: Payer: Self-pay

## 2018-10-03 ENCOUNTER — Ambulatory Visit (INDEPENDENT_AMBULATORY_CARE_PROVIDER_SITE_OTHER): Payer: Self-pay | Admitting: Internal Medicine

## 2018-10-03 DIAGNOSIS — M79605 Pain in left leg: Secondary | ICD-10-CM

## 2018-10-03 DIAGNOSIS — I10 Essential (primary) hypertension: Secondary | ICD-10-CM

## 2018-10-03 DIAGNOSIS — F3342 Major depressive disorder, recurrent, in full remission: Secondary | ICD-10-CM

## 2018-10-03 DIAGNOSIS — D649 Anemia, unspecified: Secondary | ICD-10-CM

## 2018-10-03 MED ORDER — GABAPENTIN 300 MG PO CAPS: 600.0000 mg | ORAL_CAPSULE | Freq: Two times a day (BID) | ORAL | 1 refills | Status: DC

## 2018-10-03 MED ORDER — DULOXETINE HCL 60 MG PO CPEP: 60.0000 mg | ORAL_CAPSULE | Freq: Every day | ORAL | 1 refills | Status: DC

## 2018-10-03 MED ORDER — CYCLOBENZAPRINE HCL 5 MG PO TABS: 5.0000 mg | ORAL_TABLET | Freq: Every day | ORAL | 0 refills | Status: DC

## 2018-10-03 MED ORDER — LISINOPRIL 10 MG PO TABS: 10.0000 mg | ORAL_TABLET | Freq: Every day | ORAL | 1 refills | Status: DC
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
## 2018-10-03 NOTE — Progress Notes (Signed)
Medicine attending: Medical history, presenting problems,  and medications, reviewed with resident physician Dr Justin Helberg on the day of the patient telephone consultation and I concur with his evaluation and management plan. 

## 2018-10-03 NOTE — Assessment & Plan Note (Signed)
HPI: At patient's last visit he was noted to be anemic. He was donating blood and plasma twice weekly to supplement his income. He was told to decrease the frequency of donations. Since that time is CBC is normalized. He is now donating blood and plasma once weekly.  A/P: Will continue to periodically monitor. Last CBC was appropriate.
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
## 2018-10-03 NOTE — Assessment & Plan Note (Addendum)
HPI: Patient on lisinopril 10 mg for HTN. Currently tolerating it well without apparent side effects. He has had no issues affording or taking the medication. He denies signs or symptoms of orthostatic hypotension. He states that when he goes to donate blood/plasma he is told that his blood pressure is really good. He does not remember the exact numbers.  A/P: Patient will continue lisinopril 10 mg once daily. Refills have been sent to the pharmacy. Patient had a BMP in September 2019 with stable potassium and renal function. He will need repeat BMP at his next visit.

## 2018-10-14 MED FILL — CYCLOBENZAPRINE HCL 5 MG TA: 5 | 90 days supply | Qty: 90 | Fill #0

## 2018-10-14 MED FILL — DULOXETINE HCL 60 MG CPEP: 60 | 30 days supply | Qty: 30 | Fill #0

## 2018-10-14 MED FILL — GABAPENTIN 300 MG CAPSULE: 300 | 30 days supply | Qty: 120 | Fill #0

## 2018-10-14 MED FILL — LISINOPRIL 10 MG TABLET: 10 | 90 days supply | Qty: 90 | Fill #0

## 2019-01-30 ENCOUNTER — Other Ambulatory Visit: Payer: Self-pay

## 2019-01-30 ENCOUNTER — Encounter: Payer: Self-pay | Admitting: Internal Medicine

## 2019-01-30 ENCOUNTER — Ambulatory Visit (INDEPENDENT_AMBULATORY_CARE_PROVIDER_SITE_OTHER): Payer: Self-pay | Admitting: Internal Medicine

## 2019-01-30 ENCOUNTER — Encounter (INDEPENDENT_AMBULATORY_CARE_PROVIDER_SITE_OTHER): Payer: Self-pay

## 2019-01-30 VITALS — BP 131/78 | HR 87 | Temp 98.3°F | Ht 67.0 in | Wt 149.1 lb

## 2019-01-30 DIAGNOSIS — I10 Essential (primary) hypertension: Secondary | ICD-10-CM

## 2019-01-30 DIAGNOSIS — Z79899 Other long term (current) drug therapy: Secondary | ICD-10-CM

## 2019-01-30 DIAGNOSIS — M79605 Pain in left leg: Secondary | ICD-10-CM

## 2019-01-30 DIAGNOSIS — D696 Thrombocytopenia, unspecified: Secondary | ICD-10-CM

## 2019-01-30 DIAGNOSIS — M62838 Other muscle spasm: Secondary | ICD-10-CM
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
## 2019-01-30 NOTE — Patient Instructions (Addendum)
Thank you for allowing me to provide your care.   Today we are checking some labs. If they come back normal I will get ya sent to orthopedic surgery. If they come back abnormal we will have some more investigation.   Please come back in 4 weeks or sooner if any issues arise.

## 2019-01-31 LAB — BMP8+ANION GAP
Anion Gap: 18 mmol/L (ref 10.0–18.0)
BUN/Creatinine Ratio: 13 (ref 9–20)
BUN: 9 mg/dL (ref 6–24)
CO2: 19 mmol/L — ABNORMAL LOW (ref 20–29)
Calcium: 8 mg/dL — ABNORMAL LOW (ref 8.7–10.2)
Chloride: 100 mmol/L (ref 96–106)
Creatinine, Ser: 0.68 mg/dL — ABNORMAL LOW (ref 0.76–1.27)
GFR calc Af Amer: 125 mL/min/{1.73_m2} (ref >59)
GFR calc non Af Amer: 108 mL/min/{1.73_m2} (ref >59)
Glucose: 101 mg/dL — ABNORMAL HIGH (ref 65–99)
Potassium: 3.7 mmol/L (ref 3.5–5.2)
Sodium: 137 mmol/L (ref 134–144)

## 2019-01-31 LAB — CBC WITH DIFFERENTIAL/PLATELET
Basophils Absolute: 0.1 10*3/uL (ref 0.0–0.2)
Basos: 1 %
EOS (ABSOLUTE): 0 10*3/uL (ref 0.0–0.4)
Eos: 1 %
Hematocrit: 37.1 % — ABNORMAL LOW (ref 37.5–51.0)
Hemoglobin: 12.7 g/dL — ABNORMAL LOW (ref 13.0–17.7)
Immature Grans (Abs): 0 10*3/uL (ref 0.0–0.1)
Immature Granulocytes: 0 %
Lymphocytes Absolute: 1.5 10*3/uL (ref 0.7–3.1)
Lymphs: 28 %
MCH: 33.2 pg — ABNORMAL HIGH (ref 26.6–33.0)
MCHC: 34.2 g/dL (ref 31.5–35.7)
MCV: 97 fL (ref 79–97)
Monocytes Absolute: 0.7 10*3/uL (ref 0.1–0.9)
Monocytes: 12 %
Neutrophils Absolute: 3.1 10*3/uL (ref 1.4–7.0)
Neutrophils: 58 %
Platelets: 89 10*3/uL — CL (ref 150–450)
RBC: 3.82 x10E6/uL — ABNORMAL LOW (ref 4.14–5.80)
RDW: 13.4 % (ref 11.6–15.4)
WBC: 5.3 10*3/uL (ref 3.4–10.8)

## 2019-01-31 LAB — CK: Total CK: 366 U/L — ABNORMAL HIGH (ref 41–331)

## 2019-01-31 LAB — MAGNESIUM: Magnesium: 1.8 mg/dL (ref 1.6–2.3)

## 2019-01-31 NOTE — Assessment & Plan Note (Signed)
HPI:  Patient states that he is having worsening left and right leg pain. It is worse on the left. He describes abdomen spasms. They occur both at night and during the day. He states that sometimes exercise helps with the pain but other times it does not. He was previously prescribed cyclobenzaprine however states that it does not help. He tries to stay hydrated and drinks a lot of Gatorade. He is not had any fevers or chills. On physical exam he is tenderness to palpation of the bilateral thighs.  Patient CK was mildly elevated. He had thrombocytopenia. 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  A/P: - Patient will be brought in for more of a myositis work-up.  - Front desk asked to schedule him an appointment with me next week.  - Patient has been notified of results.

## 2019-01-31 NOTE — Assessment & Plan Note (Signed)
HPI:  Patient with well-controlled hypertension on lisinopril 10 mg daily. He is not having any issues affording the medication. He is not experiencing any apparent side effects from the medications. He denies orthostatic symptoms.  Renal function found to be stable. Normal potassium.  A/P: - Continue Lisinopril 10 mg QD

## 2019-01-31 NOTE — Progress Notes (Signed)
Internal Medicine Clinic Attending  Case discussed with Dr. Helberg at the time of the visit.  We reviewed the resident's history and exam and pertinent patient test results.  I agree with the assessment, diagnosis, and plan of care documented in the resident's note.    

## 2019-02-03 ENCOUNTER — Ambulatory Visit: Payer: Self-pay

## 2019-02-03 ENCOUNTER — Encounter: Payer: Self-pay | Admitting: Internal Medicine

## 2019-02-06 ENCOUNTER — Other Ambulatory Visit: Payer: Self-pay

## 2019-02-06 ENCOUNTER — Ambulatory Visit (INDEPENDENT_AMBULATORY_CARE_PROVIDER_SITE_OTHER): Payer: Self-pay | Admitting: Internal Medicine

## 2019-02-06 ENCOUNTER — Encounter: Payer: Self-pay | Admitting: Internal Medicine

## 2019-02-06 VITALS — BP 106/68 | HR 86 | Temp 98.2°F | Ht 67.0 in | Wt 150.4 lb

## 2019-02-06 DIAGNOSIS — F3342 Major depressive disorder, recurrent, in full remission: Secondary | ICD-10-CM

## 2019-02-06 DIAGNOSIS — M609 Myositis, unspecified: Secondary | ICD-10-CM | POA: Insufficient documentation

## 2019-02-06 DIAGNOSIS — M60869 Other myositis, unspecified lower leg: Secondary | ICD-10-CM

## 2019-02-06 DIAGNOSIS — Z915 Personal history of self-harm: Secondary | ICD-10-CM

## 2019-02-06 DIAGNOSIS — M79605 Pain in left leg: Secondary | ICD-10-CM

## 2019-02-06 DIAGNOSIS — F339 Major depressive disorder, recurrent, unspecified: Secondary | ICD-10-CM

## 2019-02-06 DIAGNOSIS — Z79899 Other long term (current) drug therapy: Secondary | ICD-10-CM

## 2019-02-06 DIAGNOSIS — I10 Essential (primary) hypertension: Secondary | ICD-10-CM

## 2019-02-06 HISTORY — DX: Myositis, unspecified: M60.9

## 2019-02-06 MED ORDER — DIVALPROEX SODIUM 250 MG PO DR TAB: 250.0000 mg | DELAYED_RELEASE_TABLET | Freq: Two times a day (BID) | ORAL | 2 refills | Status: DC

## 2019-02-06 MED ORDER — GABAPENTIN 300 MG PO CAPS: 600.0000 mg | ORAL_CAPSULE | Freq: Two times a day (BID) | ORAL | 1 refills | Status: DC

## 2019-02-06 MED ORDER — DULOXETINE HCL 60 MG PO CPEP: 60.0000 mg | ORAL_CAPSULE | Freq: Every day | ORAL | 1 refills | Status: DC

## 2019-02-06 MED ORDER — LISINOPRIL 10 MG PO TABS: 10.0000 mg | ORAL_TABLET | Freq: Every day | ORAL | 1 refills | Status: DC

## 2019-02-06 MED ORDER — CYCLOBENZAPRINE HCL 5 MG PO TABS: 5.0000 mg | ORAL_TABLET | Freq: Every day | ORAL | 0 refills | Status: DC

## 2019-02-06 MED FILL — DULoxetine HCL 60 MG CPEP: 60 | 30 days supply | Qty: 30 | Fill #0

## 2019-02-06 MED FILL — GABAPENTIN 300 MG CAPSULE: 300 | 30 days supply | Qty: 120 | Fill #0

## 2019-02-06 NOTE — Patient Instructions (Signed)
Thank you for allowing me to provide your care. Today we are checking some blood work. I will call you once I have the results.
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
## 2019-02-07 LAB — TSH: TSH: 2.72 u[IU]/mL (ref 0.450–4.500)

## 2019-02-07 LAB — C3 AND C4
Complement C3, Serum: 106 mg/dL (ref 82–167)
Complement C4, Serum: 19 mg/dL (ref 14–44)

## 2019-02-07 LAB — CORTISOL: Cortisol: 7.6 ug/dL

## 2019-02-07 LAB — CK: Total CK: 230 U/L (ref 41–331)

## 2019-02-07 LAB — PHOSPHORUS: Phosphorus: 2.8 mg/dL (ref 2.8–4.1)

## 2019-02-07 LAB — MAGNESIUM: Magnesium: 1.9 mg/dL (ref 1.6–2.3)

## 2019-02-07 NOTE — Progress Notes (Signed)
Internal Medicine Clinic Attending  Case discussed with Dr. Helberg at the time of the visit.  We reviewed the resident's history and exam and pertinent patient test results.  I agree with the assessment, diagnosis, and plan of care documented in the resident's note.    

## 2019-02-07 NOTE — Assessment & Plan Note (Signed)
HPI:  Patient presented the clinic with one year history of intermittent lower extremity muscle pain. The pain occurs both at rest and with activity. Nothing alleviates her pain. He is pain medication and muscle relaxers without significant relief. He denies any new rashes, fevers, hemoptysis, headaches, hematuria, diarrhea/constipation, pleuritic chest pain, chest pain. CK at his last visit was mildly elevated. Today on physical exam his tenderness to palpation of the major muscle bodies of the lower extremities. His neurologic exam seems to be limited more by pain than strength.  A/P: - Work-up for myositis. If negative will need EMG and possible muscle biopsy.

## 2019-02-07 NOTE — Assessment & Plan Note (Signed)
Blood pressure currently well controlled. Refill of lisinopril sent out.

## 2019-02-07 NOTE — Assessment & Plan Note (Signed)
HPI:  Patient with major depressive disorder. He is a history of prior suicide attempts with the last one being approximately 10 to 14 years ago. He states that he continues to struggle with depression. He previously was evaluated by [facility] but like it was not doing him any good so he is not followed with them in a couple years. He is currently on Cymbalta and Depakote. He needs refills on both these medications. His PHQ nine is a score of 19. He states that the last time he thought of hurting himself was four months ago. He feels that he has the support he needs to prevent him from doing this.  A/P: - Refill Cymbalta and Depakote - Referral to psychiatry

## 2019-02-09 LAB — ANA+ENA+DNA/DS+ANTICH+CENTR
ANA Titer 1: NEGATIVE
Anti JO-1: <0.2 AI (ref 0.0–0.9)
Centromere Ab Screen: <0.2 AI (ref 0.0–0.9)
Chromatin Ab SerPl-aCnc: <0.2 AI (ref 0.0–0.9)
ENA RNP Ab: <0.2 AI (ref 0.0–0.9)
ENA SM Ab Ser-aCnc: <0.2 AI (ref 0.0–0.9)
ENA SSA (RO) Ab: <0.2 AI (ref 0.0–0.9)
ENA SSB (LA) Ab: <0.2 AI (ref 0.0–0.9)
Scleroderma (Scl-70) (ENA) Antibody, IgG: <0.2 AI (ref 0.0–0.9)
dsDNA Ab: <1 IU/mL (ref 0–9)

## 2019-02-10 ENCOUNTER — Other Ambulatory Visit: Payer: Self-pay | Admitting: Internal Medicine

## 2019-02-10 NOTE — Telephone Encounter (Signed)
Refill Request-Pt requesting a call back about the following medications. Pt's states pharmacy sent a request.   cyclobenzaprine (FLEXERIL) 5 MG tablet    diclofenac (VOLTAREN) 75 MG EC tablet    divalproex (DEPAKOTE) 250 MG DR tablet    DULoxetine (CYMBALTA) 60 MG capsule   ibuprofen (ADVIL,MOTRIN) 600 MG tablet  [facility] - [place], [place] - [address].

## 2019-02-12 ENCOUNTER — Other Ambulatory Visit: Payer: Self-pay | Admitting: *Deleted

## 2019-02-12 DIAGNOSIS — I10 Essential (primary) hypertension: Secondary | ICD-10-CM

## 2019-02-12 DIAGNOSIS — M79605 Pain in left leg: Secondary | ICD-10-CM

## 2019-02-12 MED ORDER — DIVALPROEX SODIUM 250 MG PO DR TAB: 250.0000 mg | DELAYED_RELEASE_TABLET | Freq: Two times a day (BID) | ORAL | 2 refills | Status: DC

## 2019-02-12 MED ORDER — CYCLOBENZAPRINE HCL 5 MG PO TABS: 5.0000 mg | ORAL_TABLET | Freq: Every day | ORAL | 0 refills | Status: DC

## 2019-02-12 MED ORDER — LISINOPRIL 10 MG PO TABS: 10.0000 mg | ORAL_TABLET | Freq: Every day | ORAL | 1 refills | Status: DC

## 2019-02-12 NOTE — Telephone Encounter (Signed)
Please re-send rxs with "IM Program" if appropriate. Thanks 

## 2019-02-13 MED FILL — CYCLOBENZAPRINE 5 MG TABLET: 5 | 30 days supply | Qty: 30 | Fill #0

## 2019-02-13 MED FILL — LISINOPRIL 10 MG TABS: 10 | 30 days supply | Qty: 30 | Fill #0

## 2019-02-13 MED FILL — DIVALPROEX SOD DR 250 MG TA: 250 | 30 days supply | Qty: 60 | Fill #0

## 2019-02-14 ENCOUNTER — Telehealth: Payer: Self-pay | Admitting: Licensed Clinical Social Worker

## 2019-02-14 NOTE — Telephone Encounter (Signed)
Patient was called due to a referral from his PCP. Pt agreed to services, and will be added to my schedule for 8/20 @ 1:00 (phone session).

## 2019-02-20 ENCOUNTER — Ambulatory Visit: Payer: Self-pay | Admitting: Licensed Clinical Social Worker

## 2019-02-20 ENCOUNTER — Telehealth: Payer: Self-pay | Admitting: Licensed Clinical Social Worker

## 2019-02-20 NOTE — Telephone Encounter (Signed)
Patient was contacted for his scheduled appointment. Patient was not in a place where he could complete the appointment, and requested to be R/S. Patient was R/S for 8/25 @3 :00.

## 2019-02-25 ENCOUNTER — Ambulatory Visit: Payer: Self-pay | Admitting: Licensed Clinical Social Worker

## 2019-02-25 ENCOUNTER — Encounter: Payer: Self-pay | Admitting: Internal Medicine

## 2019-02-25 ENCOUNTER — Telehealth: Payer: Self-pay | Admitting: Licensed Clinical Social Worker

## 2019-02-25 NOTE — Telephone Encounter (Signed)
Patient was contacted for his scheduled appointment. Patient was on the public bus system, and did not have privacy. Patient will be R/S to 8/27 @ 1:30.

## 2019-02-27 ENCOUNTER — Ambulatory Visit (INDEPENDENT_AMBULATORY_CARE_PROVIDER_SITE_OTHER): Payer: Self-pay | Admitting: Internal Medicine

## 2019-02-27 ENCOUNTER — Encounter: Payer: Self-pay | Admitting: Internal Medicine

## 2019-02-27 ENCOUNTER — Telehealth: Payer: Self-pay | Admitting: *Deleted

## 2019-02-27 ENCOUNTER — Ambulatory Visit: Payer: Self-pay | Admitting: Licensed Clinical Social Worker

## 2019-02-27 ENCOUNTER — Other Ambulatory Visit: Payer: Self-pay

## 2019-02-27 VITALS — BP 95/59 | HR 93 | Temp 98.5°F | Ht 67.0 in | Wt 151.1 lb

## 2019-02-27 DIAGNOSIS — F329 Major depressive disorder, single episode, unspecified: Secondary | ICD-10-CM

## 2019-02-27 DIAGNOSIS — Z79899 Other long term (current) drug therapy: Secondary | ICD-10-CM

## 2019-02-27 DIAGNOSIS — M60869 Other myositis, unspecified lower leg: Secondary | ICD-10-CM

## 2019-02-27 DIAGNOSIS — F17210 Nicotine dependence, cigarettes, uncomplicated: Secondary | ICD-10-CM

## 2019-02-27 DIAGNOSIS — D696 Thrombocytopenia, unspecified: Secondary | ICD-10-CM

## 2019-02-27 DIAGNOSIS — F3342 Major depressive disorder, recurrent, in full remission: Secondary | ICD-10-CM

## 2019-02-27 DIAGNOSIS — D649 Anemia, unspecified: Secondary | ICD-10-CM

## 2019-02-27 DIAGNOSIS — M60861 Other myositis, right lower leg: Secondary | ICD-10-CM

## 2019-02-27 DIAGNOSIS — R748 Abnormal levels of other serum enzymes: Secondary | ICD-10-CM

## 2019-02-27 DIAGNOSIS — M60862 Other myositis, left lower leg: Secondary | ICD-10-CM

## 2019-02-27 DIAGNOSIS — M609 Myositis, unspecified: Secondary | ICD-10-CM
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
## 2019-02-27 NOTE — Telephone Encounter (Signed)
Called patient lvm for patient to return call to clinic  Patient is self pay / has referral / need to know if patient is able to pay out of pocket.

## 2019-02-27 NOTE — Patient Instructions (Signed)
Thank you for allowing me to provide your care. All you labs came back normal so we will be getting some additional labs today and getting another test called an EMG. They should be calling to schedule this within the next week.   In the meantime please start taking the cymbalta and increase your gabapentin to 1 tablet in the morning and 2 tablets at night. Call me in 1 week to let me know how you are feeling.   Call Miquel Dunn to schedule an appointment and pick up your cymbalta and depakote.

## 2019-02-28 LAB — CBC
Hematocrit: 33.1 % — ABNORMAL LOW (ref 37.5–51.0)
Hemoglobin: 11.2 g/dL — ABNORMAL LOW (ref 13.0–17.7)
MCH: 33.6 pg — ABNORMAL HIGH (ref 26.6–33.0)
MCHC: 33.8 g/dL (ref 31.5–35.7)
MCV: 99 fL — ABNORMAL HIGH (ref 79–97)
Platelets: 98 10*3/uL — CL (ref 150–450)
RBC: 3.33 x10E6/uL — ABNORMAL LOW (ref 4.14–5.80)
RDW: 13.5 % (ref 11.6–15.4)
WBC: 6.5 10*3/uL (ref 3.4–10.8)

## 2019-02-28 LAB — CMP14 + ANION GAP
ALT: 49 IU/L — ABNORMAL HIGH (ref 0–44)
AST: 114 IU/L — ABNORMAL HIGH (ref 0–40)
Albumin/Globulin Ratio: 0.9 — ABNORMAL LOW (ref 1.2–2.2)
Albumin: 2.9 g/dL — ABNORMAL LOW (ref 3.8–4.9)
Alkaline Phosphatase: 442 IU/L — ABNORMAL HIGH (ref 39–117)
Anion Gap: 15 mmol/L (ref 10.0–18.0)
BUN/Creatinine Ratio: 11 (ref 9–20)
BUN: 7 mg/dL (ref 6–24)
Bilirubin Total: 1.1 mg/dL (ref 0.0–1.2)
CO2: 20 mmol/L (ref 20–29)
Calcium: 8.2 mg/dL — ABNORMAL LOW (ref 8.7–10.2)
Chloride: 104 mmol/L (ref 96–106)
Creatinine, Ser: 0.62 mg/dL — ABNORMAL LOW (ref 0.76–1.27)
GFR calc Af Amer: 130 mL/min/{1.73_m2} (ref >59)
GFR calc non Af Amer: 112 mL/min/{1.73_m2} (ref >59)
Globulin, Total: 3.1 g/dL (ref 1.5–4.5)
Glucose: 111 mg/dL — ABNORMAL HIGH (ref 65–99)
Potassium: 3.5 mmol/L (ref 3.5–5.2)
Sodium: 139 mmol/L (ref 134–144)
Total Protein: 6 g/dL (ref 6.0–8.5)

## 2019-03-01 NOTE — Assessment & Plan Note (Signed)
HPI:  Mr. Going presented for continued evaluation of his bilateral lower extremity myalgias. He continues to have daily pains. He states that the gabapentin at night helps him to sleep. He is not been taking it during the day. We discussed the results of his labs. We discussed the plan to pursue an EMG. In the meantime will focus on symptom control.  A/P: - EMG referral  - Start gabapentin 300 mg QAM and 622m QHS. He will call for further titration if this is not helping.   ADDENDUM: Repeat labs with normal corrected calcium. However the patient's alkaline phosphatase is elevated at 422. I suspect that this is from bone as opposed to G.I. I will try to add on a GGT for further evaluation. The patient's AST and ALT are also elevated. This is likely secondary to the patient's myositis. The patient CBC returned with continued anemia and thrombocytopenia.  I called to discuss the patient's lab results with him. And to reevaluate his history. He has noticed progressive bilateral lower extremity muscle spasms/pain that had been progressive over the past 1.5 years. They are worse in the thighs but do occur in the calves. He has noticed increasing frequency of muscle spasms in his upper extremities as well but these tend to be more in the distal muscle bodies. In addition to these pains and spasms he has noticed bilateral ankle swelling, diarrhea (nonbloody), two new skin lesions on his right abdomen and his left buttock, and nocturia. He denies fevers, headaches, cough, pleuritic chest pain, shortness of breath, abdominal pain, recent travel. He does drink 4-6 12 ounces beers per week and smokes half a pack of cigarettes per day. He denies the use of other illicit substances.   Assessment: The patient has myositis. The cause of his myositis is still unclear. Endocrine / Metabolic causes have been adequately ruled out. Autoimmune causes seem less likely given negative autoimmune work-up and normal  complements. However, inclusion body is still on the differential. He is not on any medications known to cause myositis. His neuro exam is unremarkable making a neurological cause less likely. The patient does drink EtOH which could cause a myositis and he has been asked to stop drinking.   When considering his anemia/thrombocytopenia, elevated alk phos, and signs of myositis, infiltrative diseases, such as sarcodosis, would unify all his subjective and objective history.  Plan: - For the patient's myositis we will pursue an EMG and HIV. Ultimately a MRI of the pelvis and femurs would be the most beneficial however, his hardware makes this impossible. Ultimately he may need a muscle biopsy.  - For the patient's elevated alk phos we will obtain a GGT and vitamin D. Again an MRI would be the most sensitive looking for lytic/blastic lesions. We will try plain films of the pelvis and femurs.  - For the patient's anemia/thrombocytopenia there is no indication of hemolysis; however, we will obtain a retic count, blood smear, and LDH  - As mentioned infiltrative diseases such as sarcodiosis would be a unifying diagnosis. We will obtain a CXR and IFE/free light chains.   I will discuss the case further with my attending.

## 2019-03-01 NOTE — Assessment & Plan Note (Signed)
Patient has not picked up his cymbalta or depakote yet. Planning to meet with Miquel Dunn within the next 1-2 weeks. PHQ-9 improved from prior.

## 2019-03-02 NOTE — Progress Notes (Signed)
Internal Medicine Clinic Attending  Case discussed with Dr. Helberg at the time of the visit.  We reviewed the resident's history and exam and pertinent patient test results.  I agree with the assessment, diagnosis, and plan of care documented in the resident's note.    

## 2019-03-03 ENCOUNTER — Other Ambulatory Visit: Payer: Self-pay

## 2019-03-03 ENCOUNTER — Other Ambulatory Visit (INDEPENDENT_AMBULATORY_CARE_PROVIDER_SITE_OTHER): Payer: Self-pay

## 2019-03-03 ENCOUNTER — Ambulatory Visit: Payer: Self-pay

## 2019-03-03 DIAGNOSIS — M60869 Other myositis, unspecified lower leg: Secondary | ICD-10-CM

## 2019-03-03 DIAGNOSIS — M609 Myositis, unspecified: Secondary | ICD-10-CM

## 2019-03-03 LAB — CBC WITH DIFFERENTIAL/PLATELET
Abs Immature Granulocytes: 0.03 10*3/uL (ref 0.00–0.07)
Basophils Absolute: 0.1 10*3/uL (ref 0.0–0.1)
Basophils Relative: 1 %
Eosinophils Absolute: 0 10*3/uL (ref 0.0–0.5)
Eosinophils Relative: 0 %
HCT: 36.7 % — ABNORMAL LOW (ref 39.0–52.0)
Hemoglobin: 12.1 g/dL — ABNORMAL LOW (ref 13.0–17.0)
Immature Granulocytes: 1 %
Lymphocytes Relative: 22 %
Lymphs Abs: 1.2 10*3/uL (ref 0.7–4.0)
MCH: 35 pg — ABNORMAL HIGH (ref 26.0–34.0)
MCHC: 33 g/dL (ref 30.0–36.0)
MCV: 106.1 fL — ABNORMAL HIGH (ref 80.0–100.0)
Monocytes Absolute: 0.8 10*3/uL (ref 0.1–1.0)
Monocytes Relative: 14 %
Neutro Abs: 3.4 10*3/uL (ref 1.7–7.7)
Neutrophils Relative %: 62 %
Platelets: 97 10*3/uL — ABNORMAL LOW (ref 150–400)
RBC: 3.46 MIL/uL — ABNORMAL LOW (ref 4.22–5.81)
RDW: 15.9 % — ABNORMAL HIGH (ref 11.5–15.5)
WBC: 5.4 10*3/uL (ref 4.0–10.5)
nRBC: 0 % (ref 0.0–0.2)

## 2019-03-03 LAB — RETICULOCYTES
Immature Retic Fract: 29 % — ABNORMAL HIGH (ref 2.3–15.9)
RBC.: 3.46 MIL/uL — ABNORMAL LOW (ref 4.22–5.81)
Retic Count, Absolute: 103.5 10*3/uL (ref 19.0–186.0)
Retic Ct Pct: 3 % (ref 0.4–3.1)

## 2019-03-03 NOTE — Addendum Note (Signed)
Addended by: Truddie Crumble on: 03/03/2019 01:50 PM   Modules accepted: Orders

## 2019-03-04 ENCOUNTER — Encounter: Payer: Self-pay | Admitting: Licensed Clinical Social Worker

## 2019-03-04 ENCOUNTER — Ambulatory Visit (INDEPENDENT_AMBULATORY_CARE_PROVIDER_SITE_OTHER): Payer: Self-pay | Admitting: Licensed Clinical Social Worker

## 2019-03-04 ENCOUNTER — Telehealth: Payer: Self-pay | Admitting: Internal Medicine

## 2019-03-04 DIAGNOSIS — E559 Vitamin D deficiency, unspecified: Secondary | ICD-10-CM

## 2019-03-04 DIAGNOSIS — F1010 Alcohol abuse, uncomplicated: Secondary | ICD-10-CM

## 2019-03-04 DIAGNOSIS — F331 Major depressive disorder, recurrent, moderate: Secondary | ICD-10-CM

## 2019-03-04 LAB — IMMUNOFIXATION ELECTROPHORESIS
IgA/Immunoglobulin A, Serum: 773 mg/dL — ABNORMAL HIGH (ref 90–386)
IgG (Immunoglobin G), Serum: 1384 mg/dL (ref 603–1613)
IgM (Immunoglobulin M), Srm: 119 mg/dL (ref 20–172)
Total Protein: 6.7 g/dL (ref 6.0–8.5)

## 2019-03-04 LAB — HIV ANTIBODY (ROUTINE TESTING W REFLEX): HIV Screen 4th Generation wRfx: NONREACTIVE

## 2019-03-04 LAB — LACTATE DEHYDROGENASE: LDH: 177 IU/L (ref 121–224)

## 2019-03-04 LAB — KAPPA/LAMBDA LIGHT CHAINS
Ig Kappa Free Light Chain: 41.6 mg/L — ABNORMAL HIGH (ref 3.3–19.4)
Ig Lambda Free Light Chain: 24.9 mg/L (ref 5.7–26.3)
Kappa/Lambda FluidC Ratio: 1.67 — ABNORMAL HIGH (ref 0.26–1.65)

## 2019-03-04 LAB — GAMMA GT: GGT: 2478 IU/L (ref 0–65)

## 2019-03-04 LAB — VITAMIN D 25 HYDROXY (VIT D DEFICIENCY, FRACTURES): Vit D, 25-Hydroxy: 7 ng/mL — ABNORMAL LOW (ref 30.0–100.0)

## 2019-03-04 LAB — PATHOLOGIST SMEAR REVIEW

## 2019-03-04 MED ORDER — VITAMIN D3 1.25 MG (50000 UT) PO CAPS: 1.0000 | ORAL_CAPSULE | ORAL | 0 refills | Status: DC

## 2019-03-04 MED FILL — VIT D3-50 50,000 UNITS CAPS: 1.25 MG | 70 days supply | Qty: 10 | Fill #0

## 2019-03-04 NOTE — Addendum Note (Signed)
Addended by: Ina Homes T on: 03/04/2019 09:23 AM   Modules accepted: Orders

## 2019-03-04 NOTE — Telephone Encounter (Signed)
Called the patient about his lab work. Given his elevated GGT and megaloblastic anemia a portion of his symptoms and abnormal labs work is related to EtOH use. He will stop drinking and we will re-assess symptoms and labs in the next 1-2 months.
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
## 2019-03-05 ENCOUNTER — Telehealth: Payer: Self-pay | Admitting: Internal Medicine

## 2019-03-05 NOTE — Telephone Encounter (Signed)
Patient met with [name] yesterday. Mentioned his plan to quit cold [place]. I called him today to assess risk for withdrawal. He has never had withdrawal symptoms in the past, has no history of seizures when attempting to quit, and has never had to be hospitalized for alcohol withdrawal.   We discussed the signs and symptoms of withdrawal. He lives with his fiance who will help to watch for signs/symptoms. He will call the clinic if he experiences symptoms. All questions and concerns addressed.

## 2019-03-11 ENCOUNTER — Ambulatory Visit (INDEPENDENT_AMBULATORY_CARE_PROVIDER_SITE_OTHER): Payer: Self-pay | Admitting: Licensed Clinical Social Worker

## 2019-03-11 ENCOUNTER — Encounter: Payer: Self-pay | Admitting: Licensed Clinical Social Worker

## 2019-03-11 DIAGNOSIS — F1010 Alcohol abuse, uncomplicated: Secondary | ICD-10-CM

## 2019-03-11 DIAGNOSIS — F331 Major depressive disorder, recurrent, moderate: Secondary | ICD-10-CM

## 2019-03-11 NOTE — BH Specialist Note (Signed)
Integrated Behavioral Health Visit via Telemedicine (Telephone)  03/11/2019 [name] [id number]   Session Start time: 3;00  Session End time: 3:25 Total time: 25 minutes  Referring Provider: Dr. Tarri Abernethy Type of Visit: Telephonic Patient location: Home [facility] Provider location: Office All persons participating in visit: patient and [facility]  Confirmed patient's address: Yes  Confirmed patient's phone number: Yes  Any changes to demographics: No   Discussed confidentiality: Yes    The following statements were read to the patient and/or legal guardian that are established with the [facility] Provider.  "The purpose of this phone visit is to provide behavioral health care while limiting exposure to the coronavirus (COVID19).  There is a possibility of technology failure and discussed alternative modes of communication if that failure occurs."  "By engaging in this telephone visit, you consent to the provision of healthcare.  Additionally, you authorize for your insurance to be billed for the services provided during this telephone visit."   Patient and/or legal guardian consented to telephone visit: Yes   PRESENTING CONCERNS: Patient and/or family reports the following symptoms/concerns: grief, depression, chronic pain, trauma, and alcohol consumption. Issues with anger and control.  Duration of problem: since 2008; Severity of problem: moderate  STRENGTHS (Protective Factors/Coping Skills): Prayer  GOALS ADDRESSED: Patient will: 1.  Reduce symptoms of: agitation, depression, stress and alcohol intake.  2.  Increase knowledge and/or ability of: coping skills, healthy habits and stress reduction  3.  Demonstrate ability to: Increase healthy adjustment to current life circumstances, Increase adequate support systems for patient/family, Increase motivation to adhere to plan of care, Decrease self-medicating behaviors and Begin healthy grieving over  loss  INTERVENTIONS: Interventions utilized:  Motivational Interviewing, Mindfulness or Psychologist, educational, Behavioral Activation, Brief CBT and Supportive Counseling Standardized Assessments completed: assessed for SI, HI, and self-harm.  ASSESSMENT: Patient currently experiencing depression and decreasing alcohol abuse. Patient reported that he did not consume any alcohol without for five days, and reported increase irritability during that time frame. Patient reported that he has a taste and cravings, but he chose not to give in them over the past week except one day. Patient reported his fiance pointed out increased irritability. Patient went to a cookout yesterday, and engaged in drinking. Patient processed shame around drinking. Patient was able to identify that he needs to avoid certain individuals in order to help refrain from alcohol content. Patient wants to find hobbies that do not involve consuming alcohol. Patient wants to address his health issues, and feels that becoming sober will assist with increasing his life longevity. Patient identified reasons for living. Patient reported since not drinking the past week, he has more of a reason to live and noticed decreased depressive thoughts.   Patient may benefit from counseling.  PLAN: 1. Follow up with behavioral health clinician on : two weeks.  Dessie Coma, [facility], [place]

## 2019-04-14 ENCOUNTER — Encounter (INDEPENDENT_AMBULATORY_CARE_PROVIDER_SITE_OTHER): Payer: Self-pay | Admitting: Neurology

## 2019-04-14 ENCOUNTER — Other Ambulatory Visit: Payer: Self-pay

## 2019-04-14 ENCOUNTER — Ambulatory Visit (INDEPENDENT_AMBULATORY_CARE_PROVIDER_SITE_OTHER): Payer: Self-pay | Admitting: Neurology

## 2019-04-14 DIAGNOSIS — R252 Cramp and spasm: Secondary | ICD-10-CM

## 2019-04-14 DIAGNOSIS — M5440 Lumbago with sciatica, unspecified side: Secondary | ICD-10-CM

## 2019-04-14 DIAGNOSIS — R159 Full incontinence of feces: Secondary | ICD-10-CM | POA: Insufficient documentation

## 2019-04-14 DIAGNOSIS — Z0289 Encounter for other administrative examinations: Secondary | ICD-10-CM

## 2019-04-14 DIAGNOSIS — R269 Unspecified abnormalities of gait and mobility: Secondary | ICD-10-CM | POA: Insufficient documentation

## 2019-04-14 DIAGNOSIS — R3981 Functional urinary incontinence: Secondary | ICD-10-CM | POA: Insufficient documentation
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
## 2019-04-15 ENCOUNTER — Telehealth: Payer: Self-pay | Admitting: Neurology

## 2019-04-15 NOTE — Telephone Encounter (Signed)
100% cone assistance (exp. 08/31/19) order sent to GI. They will reach out to the patient to schedule.

## 2019-05-15 ENCOUNTER — Encounter: Payer: Self-pay | Admitting: Neurology

## 2019-05-15 ENCOUNTER — Ambulatory Visit: Payer: Self-pay | Admitting: Neurology

## 2019-05-15 ENCOUNTER — Other Ambulatory Visit: Payer: Self-pay

## 2019-05-15 ENCOUNTER — Ambulatory Visit (INDEPENDENT_AMBULATORY_CARE_PROVIDER_SITE_OTHER): Payer: Self-pay | Admitting: Neurology

## 2019-05-15 VITALS — BP 142/82 | HR 80 | Temp 97.5°F | Ht 67.0 in | Wt 151.0 lb

## 2019-05-15 DIAGNOSIS — M79604 Pain in right leg: Secondary | ICD-10-CM

## 2019-05-15 DIAGNOSIS — M79605 Pain in left leg: Secondary | ICD-10-CM

## 2019-05-15 DIAGNOSIS — G3281 Cerebellar ataxia in diseases classified elsewhere: Secondary | ICD-10-CM

## 2019-05-15 DIAGNOSIS — R269 Unspecified abnormalities of gait and mobility: Secondary | ICD-10-CM

## 2019-05-15 DIAGNOSIS — R202 Paresthesia of skin: Secondary | ICD-10-CM

## 2019-05-15 DIAGNOSIS — M5440 Lumbago with sciatica, unspecified side: Secondary | ICD-10-CM
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
## 2019-05-16 LAB — VITAMIN B12: Vitamin B-12: 677 pg/mL (ref 232–1245)

## 2019-05-16 LAB — HIV ANTIBODY (ROUTINE TESTING W REFLEX): HIV Screen 4th Generation wRfx: NONREACTIVE

## 2019-05-16 LAB — SEDIMENTATION RATE: Sed Rate: 2 mm/hr (ref 0–30)

## 2019-05-16 LAB — RPR: RPR Ser Ql: NONREACTIVE

## 2019-05-16 LAB — C-REACTIVE PROTEIN: CRP: 2 mg/L (ref 0–10)

## 2019-05-19 ENCOUNTER — Telehealth: Payer: Self-pay | Admitting: *Deleted

## 2019-05-19 NOTE — Telephone Encounter (Signed)
-----   Message from Marcial Pacas, MD sent at 05/19/2019  4:16 PM EST ----- Please call patient for normal laboratory result

## 2019-05-19 NOTE — Telephone Encounter (Signed)
I spoke to the patient and he is aware of his lab results.

## 2019-05-30 ENCOUNTER — Ambulatory Visit (HOSPITAL_COMMUNITY): Admission: RE | Admit: 2019-05-30 | Payer: Self-pay | Source: Ambulatory Visit

## 2019-06-12 ENCOUNTER — Ambulatory Visit: Payer: Self-pay

## 2019-07-09 ENCOUNTER — Other Ambulatory Visit: Payer: Self-pay

## 2019-07-09 ENCOUNTER — Ambulatory Visit: Payer: Self-pay | Attending: Neurology | Admitting: Physical Therapy

## 2019-07-09 DIAGNOSIS — M6281 Muscle weakness (generalized): Secondary | ICD-10-CM

## 2019-07-09 DIAGNOSIS — R296 Repeated falls: Secondary | ICD-10-CM

## 2019-07-09 DIAGNOSIS — R2689 Other abnormalities of gait and mobility: Secondary | ICD-10-CM

## 2019-07-09 DIAGNOSIS — Z9181 History of falling: Secondary | ICD-10-CM

## 2019-07-09 DIAGNOSIS — R29818 Other symptoms and signs involving the nervous system: Secondary | ICD-10-CM
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
## 2019-07-23 ENCOUNTER — Other Ambulatory Visit: Payer: Self-pay

## 2019-07-23 ENCOUNTER — Encounter: Payer: Self-pay | Admitting: Physical Therapy

## 2019-07-23 ENCOUNTER — Ambulatory Visit: Payer: Self-pay | Admitting: Physical Therapy

## 2019-07-23 ENCOUNTER — Encounter: Payer: Self-pay | Admitting: Family Medicine

## 2019-07-23 ENCOUNTER — Ambulatory Visit: Payer: Self-pay | Admitting: Family Medicine

## 2019-07-23 VITALS — BP 124/78 | HR 82 | Temp 98.9°F | Ht 67.0 in | Wt 156.6 lb

## 2019-07-23 DIAGNOSIS — R296 Repeated falls: Secondary | ICD-10-CM

## 2019-07-23 DIAGNOSIS — M79605 Pain in left leg: Secondary | ICD-10-CM

## 2019-07-23 DIAGNOSIS — Z9181 History of falling: Secondary | ICD-10-CM

## 2019-07-23 DIAGNOSIS — R269 Unspecified abnormalities of gait and mobility: Secondary | ICD-10-CM

## 2019-07-23 DIAGNOSIS — R2689 Other abnormalities of gait and mobility: Secondary | ICD-10-CM

## 2019-07-23 DIAGNOSIS — M79604 Pain in right leg: Secondary | ICD-10-CM

## 2019-07-23 DIAGNOSIS — G629 Polyneuropathy, unspecified: Secondary | ICD-10-CM

## 2019-07-23 DIAGNOSIS — M6281 Muscle weakness (generalized): Secondary | ICD-10-CM

## 2019-07-23 DIAGNOSIS — R252 Cramp and spasm: Secondary | ICD-10-CM

## 2019-07-23 NOTE — Patient Instructions (Signed)
Access Code: [email or web address]  URL: [email or web address]  Date: 07/23/2019  Prepared by: Gard[place] - 2 sets - 30 hold - 2x daily - 7x weekly Sidelying Hip Abduction - 3-4 reps - 2 sets - 2x daily - 7x weekly Seated Long Arc Quad - 7-8 reps - 2 sets - 2x daily - 7x weekly Seated Heel Toe Raises - 10 reps - 2 sets - 2x daily - 7x weekly Seated Hamstring Stretch - 3 sets - 20 hold - 2x daily - 7x weekly

## 2019-07-23 NOTE — Patient Instructions (Signed)
Continue PT as directed.   Continue medications as prescribed by PCP. Discuss referral to pain management with PCP if he feels this is appropriate.   Follow up as needed   Leg Cramps Leg cramps occur when one or more muscles tighten and you have no control over this tightening (involuntary muscle contraction). Muscle cramps can develop in any muscle, but the most common place is in the calf muscles of the leg. Those cramps can occur during exercise or when you are at rest. Leg cramps are painful, and they may last for a few seconds to a few minutes. Cramps may return several times before they finally stop. Usually, leg cramps are not caused by a serious medical problem. In many cases, the cause is not known. Some common causes include:  Excessive physical effort (overexertion), such as during intense exercise.  Overuse from repetitive motions, or doing the same thing over and over.  Staying in a certain position for a long period of time.  Improper preparation, form, or technique while performing a sport or an activity.  Dehydration.  Injury.  Side effects of certain medicines.  Abnormally low levels of minerals in your blood (electrolytes), especially potassium and calcium. This could result from: ? Pregnancy. ? Taking diuretic medicines. Follow these instructions at home: Eating and drinking  Drink enough fluid to keep your urine pale yellow. Staying hydrated may help prevent cramps.  Eat a healthy diet that includes plenty of nutrients to help your muscles function. A healthy diet includes fruits and vegetables, lean protein, whole grains, and low-fat or nonfat dairy products. Managing pain, stiffness, and swelling      Try massaging, stretching, and relaxing the affected muscle. Do this for several minutes at a time.  If directed, put ice on areas that are sore or painful after a cramp: ? Put ice in a plastic bag. ? Place a towel between your skin and the bag. ? Leave  the ice on for 20 minutes, 2-3 times a day.  If directed, apply heat to muscles that are tense or tight. Do this before you exercise, or as often as told by your health care provider. Use the heat source that your health care provider recommends, such as a moist heat pack or a heating pad. ? Place a towel between your skin and the heat source. ? Leave the heat on for 20-30 minutes. ? Remove the heat if your skin turns bright red. This is especially important if you are unable to feel pain, heat, or cold. You may have a greater risk of getting burned.  Try taking hot showers or baths to help relax tight muscles. General instructions  If you are having frequent leg cramps, avoid intense exercise for several days.  Take over-the-counter and prescription medicines only as told by your health care provider.  Keep all follow-up visits as told by your health care provider. This is important. Contact a health care provider if:  Your leg cramps get more severe or more frequent, or they do not improve over time.  Your foot becomes cold, numb, or blue. Summary  Muscle cramps can develop in any muscle, but the most common place is in the calf muscles of the leg.  Leg cramps are painful, and they may last for a few seconds to a few minutes.  Usually, leg cramps are not caused by a serious medical problem. Often, the cause is not known.  Stay hydrated and take over-the-counter and prescription medicines only as told  by your health care provider. This information is not intended to replace advice given to you by your health care provider. Make sure you discuss any questions you have with your health care provider. Document Revised: 06/01/2017 Document Reviewed: 03/29/2017 Elsevier Patient Education  2020 [employer].
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
## 2019-07-28 ENCOUNTER — Telehealth: Payer: Self-pay

## 2019-07-28 ENCOUNTER — Ambulatory Visit: Payer: Self-pay | Admitting: Family Medicine

## 2019-07-28 NOTE — Telephone Encounter (Signed)
Unable to get in contact with the patient. No option to leave a voicemail due to it not being setup. LVM with his mother to relay the message about his appt today. Office number provided in case she has any other questions.    If patient or his mother calls back, please let them know that his appt was cancelled due to being seen by Debbora Presto, NP last week.

## 2019-08-05 ENCOUNTER — Emergency Department (HOSPITAL_COMMUNITY)
Admission: EM | Admit: 2019-08-05 | Discharge: 2019-08-05 | Disposition: A | Discharge status: Home / Self Care | Payer: Self-pay | Attending: Emergency Medicine | Admitting: Emergency Medicine

## 2019-08-05 ENCOUNTER — Encounter (HOSPITAL_COMMUNITY): Payer: Self-pay | Admitting: Emergency Medicine

## 2019-08-05 ENCOUNTER — Other Ambulatory Visit: Payer: Self-pay

## 2019-08-05 DIAGNOSIS — H1033 Unspecified acute conjunctivitis, bilateral: Secondary | ICD-10-CM | POA: Insufficient documentation

## 2019-08-05 DIAGNOSIS — I10 Essential (primary) hypertension: Secondary | ICD-10-CM | POA: Insufficient documentation

## 2019-08-05 DIAGNOSIS — Z79899 Other long term (current) drug therapy: Secondary | ICD-10-CM | POA: Insufficient documentation

## 2019-08-05 DIAGNOSIS — F17210 Nicotine dependence, cigarettes, uncomplicated: Secondary | ICD-10-CM | POA: Insufficient documentation

## 2019-08-05 MED ORDER — FLUORESCEIN SODIUM 1 MG OP STRP
1.0000 | ORAL_STRIP | Freq: Once | OPHTHALMIC | Status: DC
  Filled 2019-08-05: qty 1

## 2019-08-05 MED ORDER — POLYMYXIN B-TRIMETHOPRIM 10000-0.1 UNIT/ML-% OP SOLN: 2.0000 [drp] | OPHTHALMIC | 0 refills | Status: AC

## 2019-08-05 MED ORDER — TETRACAINE HCL 0.5 % OP SOLN
2.0000 [drp] | Freq: Once | OPHTHALMIC | Status: AC
  Administered 2019-08-05: 2 [drp] via OPHTHALMIC
  Filled 2019-08-05: qty 4

## 2019-08-05 NOTE — ED Notes (Signed)
Pt discharge and prescription education provided. Pt verbalizes understanding. Pt is alert and oriented x 4 and ambulatory at discharge.

## 2019-08-05 NOTE — ED Triage Notes (Signed)
C/o bilateral eye redness, pain, and itching since Saturday.  L eye worse than R.
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
## 2019-08-06 ENCOUNTER — Ambulatory Visit: Payer: Self-pay | Admitting: Physical Therapy

## 2019-08-13 ENCOUNTER — Ambulatory Visit: Payer: Self-pay | Attending: Neurology | Admitting: Physical Therapy

## 2019-08-20 ENCOUNTER — Other Ambulatory Visit: Payer: Self-pay

## 2019-08-20 ENCOUNTER — Ambulatory Visit: Payer: Self-pay | Admitting: Physical Therapy

## 2019-08-27 ENCOUNTER — Ambulatory Visit: Payer: Self-pay | Admitting: Physical Therapy

## 2019-09-03 ENCOUNTER — Ambulatory Visit: Payer: Self-pay | Attending: Neurology | Admitting: Physical Therapy

## 2019-09-03 DIAGNOSIS — M6281 Muscle weakness (generalized): Secondary | ICD-10-CM | POA: Insufficient documentation

## 2019-09-03 DIAGNOSIS — R2689 Other abnormalities of gait and mobility: Secondary | ICD-10-CM | POA: Insufficient documentation

## 2019-09-03 DIAGNOSIS — Z9181 History of falling: Secondary | ICD-10-CM | POA: Insufficient documentation

## 2019-09-03 DIAGNOSIS — R29818 Other symptoms and signs involving the nervous system: Secondary | ICD-10-CM | POA: Insufficient documentation

## 2019-09-03 NOTE — Progress Notes (Signed)
I have reviewed and agreed above plan. 

## 2019-09-10 ENCOUNTER — Ambulatory Visit: Payer: Self-pay | Admitting: Physical Therapy

## 2019-09-17 ENCOUNTER — Ambulatory Visit: Payer: Self-pay | Admitting: Physical Therapy

## 2019-09-24 ENCOUNTER — Ambulatory Visit: Payer: Self-pay | Admitting: Physical Therapy

## 2019-09-24 ENCOUNTER — Other Ambulatory Visit: Payer: Self-pay

## 2019-09-24 DIAGNOSIS — R2689 Other abnormalities of gait and mobility: Secondary | ICD-10-CM

## 2019-09-24 DIAGNOSIS — Z9181 History of falling: Secondary | ICD-10-CM

## 2019-09-24 DIAGNOSIS — M6281 Muscle weakness (generalized): Secondary | ICD-10-CM

## 2019-09-24 DIAGNOSIS — R29818 Other symptoms and signs involving the nervous system: Secondary | ICD-10-CM

## 2019-09-24 NOTE — Patient Instructions (Signed)
Access Code: [email or web address] URL: [email or web address] Date: 09/24/2019 Prepared by: Becker - 2 x daily - 5 x weekly - 2 sets - 30 hold Seated Long Arc Quad - 2 x daily - 5 x weekly - 7-8 reps - 2 sets Seated Heel Toe Raises - 2 x daily - 5 x weekly - 10 reps - 2 sets Seated Hamstring Stretch - 2 x daily - 5 x weekly - 3 sets - 20 hold Heel Toe Raises with Counter Support - 1 x daily - 5 x weekly - 2 sets - 10 reps Standing March with Counter Support - 2 x daily - 5 x weekly - 1 sets - 10 reps Standing Hip Abduction with Counter Support - 1 x daily - 5 x weekly - 2 sets - 6-7 reps
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
## 2019-10-16 ENCOUNTER — Ambulatory Visit: Payer: Medicaid Other

## 2019-10-22 ENCOUNTER — Ambulatory Visit: Payer: Medicaid Other | Admitting: Physical Therapy

## 2019-10-23 ENCOUNTER — Other Ambulatory Visit: Payer: Self-pay

## 2019-10-23 ENCOUNTER — Ambulatory Visit: Payer: Medicaid Other | Attending: Neurology

## 2019-10-23 DIAGNOSIS — Z9181 History of falling: Secondary | ICD-10-CM | POA: Diagnosis not present

## 2019-10-23 DIAGNOSIS — M6281 Muscle weakness (generalized): Secondary | ICD-10-CM | POA: Diagnosis present

## 2019-10-23 DIAGNOSIS — R2689 Other abnormalities of gait and mobility: Secondary | ICD-10-CM | POA: Insufficient documentation

## 2019-10-23 DIAGNOSIS — R296 Repeated falls: Secondary | ICD-10-CM | POA: Diagnosis present

## 2019-10-23 DIAGNOSIS — R29818 Other symptoms and signs involving the nervous system: Secondary | ICD-10-CM | POA: Diagnosis present

## 2019-10-23 NOTE — Patient Instructions (Signed)
Access Code: [email or web address] URL: [email or web address] Date: 10/23/2019 Prepared by: Baldomero Lamy  Exercises Seated Long Arc Quad - 2 x daily - 5 x weekly - 7-8 reps - 2 sets Seated Heel Toe Raises - 2 x daily - 5 x weekly - 10 reps - 2 sets Heel Toe Raises with Counter Support - 1 x daily - 5 x weekly - 2 sets - 10 reps Standing March with Counter Support - 2 x daily - 5 x weekly - 1 sets - 10 reps Standing Hip Abduction with Counter Support - 1 x daily - 5 x weekly - 2 sets - 6-7 reps Standing Gastroc Stretch at Counter - 1 x daily - 5 x weekly - 1 sets - 3 reps - 30 hold Seated Hamstring Stretch - 1 x daily - 7 x weekly - 1 sets - 3 reps - 30 hold Modified Thomas Stretch - 1 x daily - 7 x weekly - 1 sets - 3 reps - 30 hold
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
## 2019-10-29 ENCOUNTER — Ambulatory Visit: Payer: Medicaid Other | Admitting: Physical Therapy

## 2019-10-29 ENCOUNTER — Other Ambulatory Visit: Payer: Self-pay
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
## 2019-11-05 ENCOUNTER — Other Ambulatory Visit: Payer: Self-pay

## 2019-11-05 ENCOUNTER — Ambulatory Visit (INDEPENDENT_AMBULATORY_CARE_PROVIDER_SITE_OTHER): Payer: Self-pay | Admitting: Internal Medicine

## 2019-11-05 ENCOUNTER — Ambulatory Visit: Payer: Self-pay | Admitting: Physical Therapy

## 2019-11-05 ENCOUNTER — Encounter: Payer: Self-pay | Admitting: Internal Medicine

## 2019-11-05 VITALS — BP 137/87 | HR 73 | Temp 98.5°F | Ht 67.0 in | Wt 151.3 lb

## 2019-11-05 DIAGNOSIS — R748 Abnormal levels of other serum enzymes: Secondary | ICD-10-CM

## 2019-11-05 DIAGNOSIS — D696 Thrombocytopenia, unspecified: Secondary | ICD-10-CM

## 2019-11-05 DIAGNOSIS — M5440 Lumbago with sciatica, unspecified side: Secondary | ICD-10-CM

## 2019-11-05 LAB — PROTIME-INR
INR: 1 (ref 0.8–1.2)
Prothrombin Time: 13.1 seconds (ref 11.4–15.2)

## 2019-11-05 NOTE — Patient Instructions (Signed)
It was a pleasure to see you today [name]. Please make the following changes:  -You need to get your MRI lumbar spine completed at your earliest convenience -I have collected labs during this visit and will call you regarding the results   If you have any questions or concerns, please call our clinic at [phone] between 9am-5pm and after hours call [phone] and ask for the internal medicine resident on call. If you feel you are having a medical emergency please call 911.   Thank you, we look forward to help you remain healthy!  Lars Mage, MD Internal Medicine PGY3
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
## 2019-11-06 ENCOUNTER — Encounter: Payer: Self-pay | Admitting: *Deleted

## 2019-11-06 DIAGNOSIS — R748 Abnormal levels of other serum enzymes: Secondary | ICD-10-CM | POA: Insufficient documentation

## 2019-11-06 LAB — CMP14 + ANION GAP
ALT: 42 IU/L (ref 0–44)
AST: 83 IU/L — ABNORMAL HIGH (ref 0–40)
Albumin/Globulin Ratio: 1.3 (ref 1.2–2.2)
Albumin: 4.4 g/dL (ref 3.8–4.9)
Alkaline Phosphatase: 197 IU/L — ABNORMAL HIGH (ref 39–117)
Anion Gap: 14 mmol/L (ref 10.0–18.0)
BUN/Creatinine Ratio: 13 (ref 9–20)
BUN: 14 mg/dL (ref 6–24)
Bilirubin Total: 0.5 mg/dL (ref 0.0–1.2)
CO2: 25 mmol/L (ref 20–29)
Calcium: 9.6 mg/dL (ref 8.7–10.2)
Chloride: 100 mmol/L (ref 96–106)
Creatinine, Ser: 1.06 mg/dL (ref 0.76–1.27)
GFR calc Af Amer: 91 mL/min/{1.73_m2} (ref >59)
GFR calc non Af Amer: 79 mL/min/{1.73_m2} (ref >59)
Globulin, Total: 3.4 g/dL (ref 1.5–4.5)
Glucose: 77 mg/dL (ref 65–99)
Potassium: 4.5 mmol/L (ref 3.5–5.2)
Sodium: 139 mmol/L (ref 134–144)
Total Protein: 7.8 g/dL (ref 6.0–8.5)

## 2019-11-06 LAB — CBC
Hematocrit: 39.9 % (ref 37.5–51.0)
Hemoglobin: 13.6 g/dL (ref 13.0–17.7)
MCH: 34.9 pg — ABNORMAL HIGH (ref 26.6–33.0)
MCHC: 34.1 g/dL (ref 31.5–35.7)
MCV: 102 fL — ABNORMAL HIGH (ref 79–97)
Platelets: 113 10*3/uL — ABNORMAL LOW (ref 150–450)
RBC: 3.9 x10E6/uL — ABNORMAL LOW (ref 4.14–5.80)
RDW: 13.4 % (ref 11.6–15.4)
WBC: 5.8 10*3/uL (ref 3.4–10.8)

## 2019-11-06 LAB — GAMMA GT: GGT: 1836 IU/L (ref 0–65)

## 2019-11-06 NOTE — Addendum Note (Signed)
Addended by: Truddie Crumble on: 11/06/2019 01:47 PM   Modules accepted: Orders

## 2019-11-06 NOTE — Progress Notes (Signed)
Internal Medicine Clinic Attending  Case discussed with Dr. Chundi at the time of the visit.  We reviewed the resident's history and exam and pertinent patient test results.  I agree with the assessment, diagnosis, and plan of care documented in the resident's note. 

## 2019-11-06 NOTE — Assessment & Plan Note (Addendum)
  States that he has been vomiting appoximately once every week. He has been noticing nose bleeds and one episode of blood in his stool 2 months ago. Patient's cmp from august 2020 showed elevated liver enzymes ast 114, alt 49, ALP 442.  He also has thrombocytopenia and elevated MCV.  Patient used to drink alcohol 12pack every 2 days but he states that he now drinks a 12 pack every week.   Assessment and plan  Patient will need to be evaluated for possible cholestatic type injury.  Thrombocytopenia and elevated MCV may be secondary to alcohol use disorder however elevated ALP is usually not expected and therefore will need to examine intrahepatic causes.  -CMP -cbc -blood smear  -inr  Addendum Patient's liver enzymes have improved along with ALP, but his GGT is still elevated in the thousands.  Due to this we will order right upper quadrant ultrasound to check for ductal dilation and other structural changes in the liver.  I have informed the patient regarding this and he should get this study done as soon as he has an appointment with his financial services.

## 2019-11-06 NOTE — Assessment & Plan Note (Signed)
Patient has had chronic leg pain that has been documented since 2018.  He has been given muscle relaxants, gabapentin, physical therapy which has not helped alleviate the pain.  Patient describes the pain as being present from bilateral pelvis down to the legs.  He states it is intermittent in nature, spasm type, 10/10 in intensity.  Accompanied with numbness and tingling.  He states that he is concerned that he may fall due to the pain.  He has had numerous falls over the past year.  The last fall was 1 week ago when he was coming down the steps. He fell onto left side and didn't hit head.   Last MRI lumbar spine showed mild left foraminal stenosis l5-s1, borderline foraminal stenosis at l3-l4.   Assessment and plan  He patient's symptoms are concerning for possible lumbar stenosis.  Patient needs repeat MRI lumbar spine.  However, he does not have insurance and will maintain see financial services prior.  -mri lumbar spine

## 2019-11-12 ENCOUNTER — Ambulatory Visit: Payer: Self-pay | Admitting: Physical Therapy

## 2019-11-19 ENCOUNTER — Ambulatory Visit: Payer: Self-pay | Admitting: Physical Therapy

## 2019-11-26 ENCOUNTER — Ambulatory Visit: Payer: Self-pay

## 2019-11-28 ENCOUNTER — Ambulatory Visit: Payer: Self-pay

## 2020-01-21 DIAGNOSIS — Z0271 Encounter for disability determination: Secondary | ICD-10-CM

## 2020-02-13 ENCOUNTER — Encounter: Payer: Self-pay | Admitting: Student

## 2020-02-13 ENCOUNTER — Ambulatory Visit: Payer: Medicaid Other | Admitting: Student

## 2020-02-13 ENCOUNTER — Other Ambulatory Visit: Payer: Self-pay

## 2020-02-13 VITALS — BP 117/80 | HR 100 | Temp 98.4°F | Wt 150.0 lb

## 2020-02-13 DIAGNOSIS — F330 Major depressive disorder, recurrent, mild: Secondary | ICD-10-CM

## 2020-02-13 DIAGNOSIS — F17210 Nicotine dependence, cigarettes, uncomplicated: Secondary | ICD-10-CM

## 2020-02-13 DIAGNOSIS — M79604 Pain in right leg: Secondary | ICD-10-CM | POA: Diagnosis not present

## 2020-02-13 DIAGNOSIS — F1010 Alcohol abuse, uncomplicated: Secondary | ICD-10-CM | POA: Diagnosis not present

## 2020-02-13 DIAGNOSIS — F3342 Major depressive disorder, recurrent, in full remission: Secondary | ICD-10-CM

## 2020-02-13 DIAGNOSIS — M5442 Lumbago with sciatica, left side: Secondary | ICD-10-CM | POA: Diagnosis not present

## 2020-02-13 DIAGNOSIS — F17200 Nicotine dependence, unspecified, uncomplicated: Secondary | ICD-10-CM

## 2020-02-13 DIAGNOSIS — M79605 Pain in left leg: Secondary | ICD-10-CM

## 2020-02-13 DIAGNOSIS — M5441 Lumbago with sciatica, right side: Secondary | ICD-10-CM

## 2020-02-13 DIAGNOSIS — D696 Thrombocytopenia, unspecified: Secondary | ICD-10-CM | POA: Insufficient documentation

## 2020-02-13 DIAGNOSIS — I10 Essential (primary) hypertension: Secondary | ICD-10-CM | POA: Diagnosis not present

## 2020-02-13 DIAGNOSIS — R269 Unspecified abnormalities of gait and mobility: Secondary | ICD-10-CM | POA: Diagnosis not present

## 2020-02-13 DIAGNOSIS — R748 Abnormal levels of other serum enzymes: Secondary | ICD-10-CM | POA: Diagnosis not present

## 2020-02-13 DIAGNOSIS — F1020 Alcohol dependence, uncomplicated: Secondary | ICD-10-CM | POA: Insufficient documentation

## 2020-02-13 DIAGNOSIS — R202 Paresthesia of skin: Secondary | ICD-10-CM

## 2020-02-13 LAB — COMPREHENSIVE METABOLIC PANEL
ALT: 40 U/L (ref 0–44)
AST: 69 U/L — ABNORMAL HIGH (ref 15–41)
Albumin: 3.7 g/dL (ref 3.5–5.0)
Alkaline Phosphatase: 153 U/L — ABNORMAL HIGH (ref 38–126)
Anion gap: 10 (ref 5–15)
BUN: 7 mg/dL (ref 6–20)
CO2: 28 mmol/L (ref 22–32)
Calcium: 9.5 mg/dL (ref 8.9–10.3)
Chloride: 101 mmol/L (ref 98–111)
Creatinine, Ser: 0.74 mg/dL (ref 0.61–1.24)
GFR calc Af Amer: >60 mL/min (ref >60)
GFR calc non Af Amer: >60 mL/min (ref >60)
Glucose, Bld: 79 mg/dL (ref 70–99)
Potassium: 4.2 mmol/L (ref 3.5–5.1)
Sodium: 139 mmol/L (ref 135–145)
Total Bilirubin: 0.9 mg/dL (ref 0.3–1.2)
Total Protein: 7.4 g/dL (ref 6.5–8.1)

## 2020-02-13 LAB — CBC
HCT: 39.5 % (ref 39.0–52.0)
Hemoglobin: 12.8 g/dL — ABNORMAL LOW (ref 13.0–17.0)
MCH: 34.5 pg — ABNORMAL HIGH (ref 26.0–34.0)
MCHC: 32.4 g/dL (ref 30.0–36.0)
MCV: 106.5 fL — ABNORMAL HIGH (ref 80.0–100.0)
Platelets: 87 10*3/uL — ABNORMAL LOW (ref 150–400)
RBC: 3.71 MIL/uL — ABNORMAL LOW (ref 4.22–5.81)
RDW: 13.2 % (ref 11.5–15.5)
WBC: 5.4 10*3/uL (ref 4.0–10.5)
nRBC: 0 % (ref 0.0–0.2)

## 2020-02-13 LAB — GAMMA GT: GGT: 1409 U/L — ABNORMAL HIGH (ref 7–50)

## 2020-02-13 MED ORDER — DIVALPROEX SODIUM 250 MG PO DR TAB: 250.0000 mg | DELAYED_RELEASE_TABLET | Freq: Two times a day (BID) | ORAL | 2 refills | Status: DC

## 2020-02-13 MED ORDER — CYCLOBENZAPRINE HCL 5 MG PO TABS: 5.0000 mg | ORAL_TABLET | Freq: Every day | ORAL | 0 refills | Status: DC

## 2020-02-13 MED ORDER — DULOXETINE HCL 60 MG PO CPEP: 60.0000 mg | ORAL_CAPSULE | Freq: Every day | ORAL | 1 refills | Status: DC

## 2020-02-13 MED ORDER — GABAPENTIN 300 MG PO CAPS: 600.0000 mg | ORAL_CAPSULE | Freq: Two times a day (BID) | ORAL | 1 refills | Status: DC

## 2020-02-13 MED ORDER — LISINOPRIL 10 MG PO TABS: 10.0000 mg | ORAL_TABLET | Freq: Every day | ORAL | 1 refills | Status: DC

## 2020-02-13 MED FILL — GABAPENTIN 300 MG CAPSULE: 300 | 30 days supply | Qty: 120 | Fill #0

## 2020-02-13 MED FILL — CYCLOBENZAPRINE HCL 5 MG TA: 5 | 30 days supply | Qty: 30 | Fill #0

## 2020-02-13 MED FILL — LISINOPRIL 10 MG TABS: 10 | 30 days supply | Qty: 30 | Fill #0

## 2020-02-13 MED FILL — DULoxetine HCL 60 MG CPEP: 60 | 30 days supply | Qty: 30 | Fill #0

## 2020-02-13 MED FILL — DIVALPROEX SOD DR 250 MG TA: 250 | 30 days supply | Qty: 60 | Fill #0
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
## 2020-02-13 NOTE — Assessment & Plan Note (Signed)
Patient presents with worsening lower extremity pain, gait, and fecal/urinary incontinence. I suspect this is due to worsening of patient's lumbar stenosis with compression of his spinal cord. He notes that this worsening has been occurring over the past 6 months and do not believe patient needs admission at this time as he is stable.   Bladder scan performed in the clinic, prior to urination patient had over 150ccs in the bladder, and post void was 19 cc's. Do not believe patient needs to be catheterized at this time.   A/P - Suspected lumbar stenosis with worsening compression of spinal cord  - Urgent Neurosurgery consult in place - Stat MRI lumbar spine ordered - Patient given return precautions if worsening bowel/bladder incontinence, inability to walk, or new symptoms. He agrees to the plan.

## 2020-02-13 NOTE — Assessment & Plan Note (Addendum)
Patient notes drinking one 40 oz beer a day. He notes drinking because he is depressed. Patient states he would like to stop drinking. Discussed with patient if he is going to stop, to slowly taper himself off. Denies alcohol withdrawals in the past. Discussed with patient counseling with Dessie Coma and that she will be able to provide him with local resources to assist him with his substance use.   A/P - Reassess patient's alcohol use next visit.  - Referral put in for counseling services. Appreciate there assistance.

## 2020-02-13 NOTE — Assessment & Plan Note (Addendum)
GGT is downward trending from prior examinations, but continues to be elevated above normal. AST of 69 and ALK phos of 153.  Platelets of 87, GGT of 1409.   A/P  - Will order RUQ ultrasound to assess for hepatic structural abnormalities and will consider CT scan of the abdomen if further imaging is needed.  - Will also consider ordering a lipase to assess for any evidence of pancreatic injury.

## 2020-02-13 NOTE — Assessment & Plan Note (Signed)
BP of 117/80. Patient on lisinopril 10 mg daily, stable at this time.   A/P Continue Lisionpril 10 mg daily Will continue to monitor in the clinic.

## 2020-02-13 NOTE — Patient Instructions (Addendum)
Thank you for allowing [place] to care for you today!  Today we discussed:   1) Worsening lower extremity weakness and worsening loss of bowel/bladder function - We are ordering a stat MRI due to your worsening weakness and loss of bowel/bladder function - We also have put in a referal for neurosurgery.  - Please be aware of these phone calls  2) Depression - I will rewrite your prescriptions for your depression and have also put in a referral for you to talk with our counseling services.  - Please attempt to remove all weapons from your home - If you have any thoughts of harming yourself or others, please call 911 or go to your closest emergency department.   3) Smoking Cessation  - Today we talked about quitting smoking, however, I would like to start you on medication that will help with this after get your lab results back.   4) Alcohol - Thank you for sharing with me that you would like to stop drinking alcohol! We will work together on decreasing this over the following weeks. You can also bring this up to [facility] and she will be able to provide you resources.    Please do not hesitate to give [place] a call if you have any questions or concerns. I will let you know the results of your lab work.

## 2020-02-13 NOTE — Assessment & Plan Note (Signed)
Platelet count of 89 today. Patient has been thrombocytopenic in the past. Will continue to workup at follow up visit.   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  - Test for HIV/Hep C - Will continue to monitor.

## 2020-02-13 NOTE — Assessment & Plan Note (Signed)
Patient smokes 1/2 PPD and states he would like to stop. Discussed options of gum, patches, and medications. Will further discuss at patient's next visit.   A/P - Continue smoking cessation education - At follow up visit plan to begin patch, gum, or medication.

## 2020-02-13 NOTE — Assessment & Plan Note (Addendum)
Patient notes episodes of depression and one episode of suicidal ideations with a plan. Denies any suicidal ideation at this time. States he feels stable on his medications and would like to talk with someone.   He states the guns will be out of the house tomorrow.   A/P - Reordered patient's cymbalta 60 mg and depakote 250 mg.  - Referral put in for patient to meet with Dessie Coma - Patient states gun will be removed from the house, will follow up at next visit.

## 2020-02-16 NOTE — Addendum Note (Signed)
Addended by: [name] on: 02/16/2020 02:53 PM   Modules accepted: Orders

## 2020-02-16 NOTE — Addendum Note (Signed)
Addended by: [name] on: 02/16/2020 03:01 PM   Modules accepted: Orders

## 2020-02-16 NOTE — Progress Notes (Signed)
Internal Medicine Clinic Attending  I saw and evaluated the patient.  I personally confirmed the key portions of the history and exam documented by Dr. [name] and I reviewed pertinent patient test results.  The assessment, diagnosis, and plan were formulated together and I agree with the documentation in the resident's note.  

## 2020-02-16 NOTE — Addendum Note (Signed)
Addended by: [name] on: 02/16/2020 02:16 PM   Modules accepted: Orders

## 2020-02-17 ENCOUNTER — Telehealth: Payer: Self-pay

## 2020-02-17 NOTE — Telephone Encounter (Signed)
Faxed scat form to [phone], confirmation went through.

## 2020-02-18 ENCOUNTER — Telehealth: Payer: Self-pay | Admitting: Student

## 2020-02-18 NOTE — Telephone Encounter (Signed)
[name] was called to check on how he was doing. He states he was doing well, he restarted all of his medications that we discussed at his prior visit. Of importance, he started taking his cymbalta and depakote again and notes feeling less depressed. He notes the day after our appointment he took his guns to his friends house. He denies any worsening of his urinary/bladder incontinence and has his MRI of the lumbar spine scheduled for tomorrow 08/19.

## 2020-02-19 ENCOUNTER — Ambulatory Visit (HOSPITAL_COMMUNITY): Admission: RE | Admit: 2020-02-19 | Payer: Medicaid Other | Source: Ambulatory Visit

## 2020-03-01 ENCOUNTER — Telehealth: Payer: Self-pay | Admitting: Student

## 2020-03-01 ENCOUNTER — Encounter: Payer: Medicaid Other | Admitting: Student

## 2020-03-01 NOTE — Telephone Encounter (Signed)
Called and discussed with patient. He has [place] of RUQ and MRI scheduled for 09/02 and 09/01. Patient's appointment today was to follow up on MRI and [place] results. Would be best if he rescheduled appointment. Thank you
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
## 2020-03-03 ENCOUNTER — Ambulatory Visit (HOSPITAL_COMMUNITY): Payer: Medicaid Other

## 2020-03-03 ENCOUNTER — Other Ambulatory Visit: Payer: Self-pay

## 2020-03-03 ENCOUNTER — Ambulatory Visit (HOSPITAL_COMMUNITY): Admission: RE | Admit: 2020-03-03 | Payer: Medicaid Other | Source: Ambulatory Visit

## 2020-03-03 ENCOUNTER — Ambulatory Visit (HOSPITAL_COMMUNITY)
Admission: RE | Admit: 2020-03-03 | Discharge: 2020-03-03 | Disposition: A | Discharge status: Home / Self Care | Payer: Medicaid Other | Source: Ambulatory Visit | Attending: Internal Medicine | Admitting: Internal Medicine

## 2020-03-03 DIAGNOSIS — R748 Abnormal levels of other serum enzymes: Secondary | ICD-10-CM | POA: Diagnosis present

## 2020-03-04 ENCOUNTER — Ambulatory Visit (HOSPITAL_COMMUNITY): Payer: Medicaid Other

## 2020-03-09 ENCOUNTER — Encounter: Payer: Self-pay | Admitting: Licensed Clinical Social Worker

## 2020-03-09 ENCOUNTER — Other Ambulatory Visit: Payer: Self-pay | Admitting: Internal Medicine

## 2020-03-09 DIAGNOSIS — R748 Abnormal levels of other serum enzymes: Secondary | ICD-10-CM

## 2020-03-09 NOTE — Addendum Note (Signed)
Addended by: Jean Rosenthal on: 03/09/2020 10:36 AM   Modules accepted: Orders

## 2020-03-11 ENCOUNTER — Ambulatory Visit (HOSPITAL_COMMUNITY): Admission: RE | Admit: 2020-03-11 | Payer: Medicaid Other | Source: Ambulatory Visit

## 2020-03-23 ENCOUNTER — Ambulatory Visit (HOSPITAL_COMMUNITY): Admission: RE | Admit: 2020-03-23 | Payer: Medicaid Other | Source: Ambulatory Visit

## 2020-04-01 ENCOUNTER — Ambulatory Visit (HOSPITAL_COMMUNITY)
Admission: RE | Admit: 2020-04-01 | Discharge: 2020-04-01 | Disposition: A | Discharge status: Home / Self Care | Payer: Medicaid Other | Source: Ambulatory Visit | Attending: Internal Medicine | Admitting: Internal Medicine

## 2020-04-01 ENCOUNTER — Other Ambulatory Visit: Payer: Self-pay

## 2020-04-01 DIAGNOSIS — M79605 Pain in left leg: Secondary | ICD-10-CM | POA: Diagnosis present

## 2020-04-01 DIAGNOSIS — R202 Paresthesia of skin: Secondary | ICD-10-CM | POA: Diagnosis present

## 2020-04-01 DIAGNOSIS — M79604 Pain in right leg: Secondary | ICD-10-CM | POA: Diagnosis present

## 2020-04-01 MED ORDER — GADOBUTROL 1 MMOL/ML IV SOLN
6.5000 mL | Freq: Once | INTRAVENOUS | Status: AC | PRN
  Administered 2020-04-01: 6.5 mL via INTRAVENOUS

## 2020-05-10 NOTE — Addendum Note (Signed)
Addended by: Hulan Fray on: 05/10/2020 05:37 PM   Modules accepted: Orders

## 2020-05-13 ENCOUNTER — Encounter (HOSPITAL_COMMUNITY): Payer: Self-pay | Admitting: Emergency Medicine

## 2020-05-13 ENCOUNTER — Emergency Department (HOSPITAL_COMMUNITY): Payer: Medicaid Other

## 2020-05-13 ENCOUNTER — Emergency Department (HOSPITAL_COMMUNITY)
Admission: EM | Admit: 2020-05-13 | Discharge: 2020-05-13 | Disposition: A | Discharge status: Home / Self Care | Payer: Medicaid Other | Attending: Emergency Medicine | Admitting: Emergency Medicine

## 2020-05-13 DIAGNOSIS — R0602 Shortness of breath: Secondary | ICD-10-CM | POA: Insufficient documentation

## 2020-05-13 DIAGNOSIS — Z79899 Other long term (current) drug therapy: Secondary | ICD-10-CM | POA: Diagnosis not present

## 2020-05-13 DIAGNOSIS — R071 Chest pain on breathing: Secondary | ICD-10-CM | POA: Diagnosis not present

## 2020-05-13 DIAGNOSIS — F17210 Nicotine dependence, cigarettes, uncomplicated: Secondary | ICD-10-CM | POA: Insufficient documentation

## 2020-05-13 DIAGNOSIS — R059 Cough, unspecified: Secondary | ICD-10-CM | POA: Diagnosis not present

## 2020-05-13 DIAGNOSIS — R072 Precordial pain: Secondary | ICD-10-CM | POA: Diagnosis present

## 2020-05-13 DIAGNOSIS — R609 Edema, unspecified: Secondary | ICD-10-CM | POA: Insufficient documentation

## 2020-05-13 DIAGNOSIS — I10 Essential (primary) hypertension: Secondary | ICD-10-CM | POA: Diagnosis not present

## 2020-05-13 DIAGNOSIS — R5383 Other fatigue: Secondary | ICD-10-CM | POA: Diagnosis not present

## 2020-05-13 DIAGNOSIS — Z20822 Contact with and (suspected) exposure to covid-19: Secondary | ICD-10-CM | POA: Insufficient documentation

## 2020-05-13 LAB — BASIC METABOLIC PANEL
Anion gap: 10 (ref 5–15)
BUN: 5 mg/dL — ABNORMAL LOW (ref 6–20)
CO2: 27 mmol/L (ref 22–32)
Calcium: 9 mg/dL (ref 8.9–10.3)
Chloride: 100 mmol/L (ref 98–111)
Creatinine, Ser: 0.63 mg/dL (ref 0.61–1.24)
GFR, Estimated: >60 mL/min (ref >60)
Glucose, Bld: 81 mg/dL (ref 70–99)
Potassium: 4.1 mmol/L (ref 3.5–5.1)
Sodium: 137 mmol/L (ref 135–145)

## 2020-05-13 LAB — HEPATIC FUNCTION PANEL
ALT: 31 U/L (ref 0–44)
AST: 54 U/L — ABNORMAL HIGH (ref 15–41)
Albumin: 3.6 g/dL (ref 3.5–5.0)
Alkaline Phosphatase: 117 U/L (ref 38–126)
Bilirubin, Direct: 0.2 mg/dL (ref 0.0–0.2)
Indirect Bilirubin: 0.8 mg/dL (ref 0.3–0.9)
Total Bilirubin: 1 mg/dL (ref 0.3–1.2)
Total Protein: 7 g/dL (ref 6.5–8.1)

## 2020-05-13 LAB — CBC
HCT: 45.8 % (ref 39.0–52.0)
Hemoglobin: 15.1 g/dL (ref 13.0–17.0)
MCH: 34.1 pg — ABNORMAL HIGH (ref 26.0–34.0)
MCHC: 33 g/dL (ref 30.0–36.0)
MCV: 103.4 fL — ABNORMAL HIGH (ref 80.0–100.0)
Platelets: 87 10*3/uL — ABNORMAL LOW (ref 150–400)
RBC: 4.43 MIL/uL (ref 4.22–5.81)
RDW: 12.4 % (ref 11.5–15.5)
WBC: 3.5 10*3/uL — ABNORMAL LOW (ref 4.0–10.5)
nRBC: 0 % (ref 0.0–0.2)

## 2020-05-13 LAB — RESPIRATORY PANEL BY RT PCR (FLU A&B, COVID)
Influenza A by PCR: NEGATIVE
Influenza B by PCR: NEGATIVE
SARS Coronavirus 2 by RT PCR: NEGATIVE

## 2020-05-13 LAB — TROPONIN I (HIGH SENSITIVITY)
Troponin I (High Sensitivity): 21 ng/L — ABNORMAL HIGH (ref <18)
Troponin I (High Sensitivity): 19 ng/L — ABNORMAL HIGH (ref <18)

## 2020-05-13 LAB — LIPASE, BLOOD: Lipase: 28 U/L (ref 11–51)

## 2020-05-13 LAB — D-DIMER, QUANTITATIVE: D-Dimer, Quant: <0.27 ug/mL-FEU (ref 0.00–0.50)

## 2020-05-13 MED ORDER — CYCLOBENZAPRINE HCL 10 MG PO TABS: 10.0000 mg | ORAL_TABLET | Freq: Two times a day (BID) | ORAL | 0 refills | Status: DC | PRN

## 2020-05-13 NOTE — ED Triage Notes (Addendum)
Pt endorses cough, chest pain and sob that began today. Denies known recent sick contacts or fevers. Endorses some edema to BLLEs. Patient does report he is fully vaccinated against covid and also had covid in march.

## 2020-05-13 NOTE — Discharge Instructions (Signed)
Your work-up today did not show any evidence of blood clot with a D-dimer that was negative. Your chest x-ray did not show pneumonia or collapsed lung. Your heart enzymes were slightly elevated but were downtrending, do not suspect acute cardiac injury at this time. Your EKG did not show acute heart attack. With your symptoms worsened with a deep breathing and coughing, I do suspect is more musculoskeletal chest wall pain with the muscles we discussed. Please use the muscle relaxant and follow-up with your primary doctor. If your symptoms change or worsen or you start having new symptoms, please return to the nearest emergency department for further evaluation and management.
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
## 2020-05-13 NOTE — ED Notes (Signed)
Reviewed discharge instructions with patient. Follow-up care and medications reviewed. Patient  verbalized understanding. Patient A&Ox4, VSS, and ambulatory with steady gait upon discharge.  °

## 2020-06-30 ENCOUNTER — Encounter: Payer: Self-pay | Admitting: Internal Medicine

## 2020-06-30 ENCOUNTER — Other Ambulatory Visit: Payer: Self-pay

## 2020-06-30 ENCOUNTER — Ambulatory Visit (INDEPENDENT_AMBULATORY_CARE_PROVIDER_SITE_OTHER): Payer: Medicaid Other | Admitting: Internal Medicine

## 2020-06-30 ENCOUNTER — Ambulatory Visit (HOSPITAL_COMMUNITY)
Admission: RE | Admit: 2020-06-30 | Discharge: 2020-06-30 | Disposition: A | Discharge status: Home / Self Care | Payer: Medicaid Other | Source: Ambulatory Visit | Attending: Internal Medicine | Admitting: Internal Medicine

## 2020-06-30 ENCOUNTER — Other Ambulatory Visit: Payer: Self-pay | Admitting: Internal Medicine

## 2020-06-30 VITALS — BP 139/89 | HR 90 | Temp 98.3°F | Ht 67.0 in | Wt 150.5 lb

## 2020-06-30 DIAGNOSIS — M62838 Other muscle spasm: Secondary | ICD-10-CM

## 2020-06-30 DIAGNOSIS — M25551 Pain in right hip: Secondary | ICD-10-CM | POA: Diagnosis present

## 2020-06-30 DIAGNOSIS — R7401 Elevation of levels of liver transaminase levels: Secondary | ICD-10-CM

## 2020-06-30 DIAGNOSIS — F3342 Major depressive disorder, recurrent, in full remission: Secondary | ICD-10-CM

## 2020-06-30 DIAGNOSIS — M79605 Pain in left leg: Secondary | ICD-10-CM

## 2020-06-30 DIAGNOSIS — R3981 Functional urinary incontinence: Secondary | ICD-10-CM | POA: Diagnosis not present

## 2020-06-30 DIAGNOSIS — R748 Abnormal levels of other serum enzymes: Secondary | ICD-10-CM | POA: Diagnosis not present

## 2020-06-30 DIAGNOSIS — I10 Essential (primary) hypertension: Secondary | ICD-10-CM | POA: Diagnosis not present

## 2020-06-30 DIAGNOSIS — M79604 Pain in right leg: Secondary | ICD-10-CM | POA: Diagnosis not present

## 2020-06-30 DIAGNOSIS — R252 Cramp and spasm: Secondary | ICD-10-CM

## 2020-06-30 DIAGNOSIS — F1010 Alcohol abuse, uncomplicated: Secondary | ICD-10-CM | POA: Diagnosis not present

## 2020-06-30 DIAGNOSIS — E559 Vitamin D deficiency, unspecified: Secondary | ICD-10-CM

## 2020-06-30 MED ORDER — DULOXETINE HCL 60 MG PO CPEP: 60.0000 mg | ORAL_CAPSULE | Freq: Every day | ORAL | 1 refills | Status: DC

## 2020-06-30 MED ORDER — GABAPENTIN 300 MG PO CAPS: 600.0000 mg | ORAL_CAPSULE | Freq: Three times a day (TID) | ORAL | 1 refills | Status: DC

## 2020-06-30 MED ORDER — CYCLOBENZAPRINE HCL 5 MG PO TABS: 5.0000 mg | ORAL_TABLET | Freq: Every day | ORAL | 0 refills | Status: DC

## 2020-06-30 MED ORDER — LISINOPRIL 10 MG PO TABS: 10.0000 mg | ORAL_TABLET | Freq: Every day | ORAL | 1 refills | Status: DC

## 2020-06-30 MED FILL — GABAPENTIN 300 MG CAPSULE: 300 | 30 days supply | Qty: 180 | Fill #0

## 2020-06-30 MED FILL — LISINOPRIL 10 MG TABS: 10 | 90 days supply | Qty: 90 | Fill #0

## 2020-06-30 MED FILL — CYCLOBENZAPRINE HCL 5 MG TA: 5 | 21 days supply | Qty: 21 | Fill #0

## 2020-06-30 MED FILL — DULoxetine HCL 60 MG CPEP: 60 | 90 days supply | Qty: 90 | Fill #0

## 2020-06-30 NOTE — Patient Instructions (Addendum)
Thank you for allowing [place] to provide your care today. Today we discussed lower leg pain and depression     I have ordered the following labs for you:  Hepatitis labs and vitamin D   I will call if any are abnormal.    Today we made the following changes to your medications:   I have increased your gabapentin to three times per day.    Please follow-up in one month to discuss your depression and effectiveness of medication. This can be a telehealth visit.    Please call the internal medicine center clinic if you have any questions or concerns, we may be able to help and keep you from a long and expensive emergency room wait. Our clinic and after hours phone number is [phone], the best time to call is Monday through Friday 9 am to 4 pm but there is always someone available 24/7 if you have an emergency. If you need medication refills please notify your pharmacy one week in advance and they will send [place] a request.
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
## 2020-07-01 LAB — VITAMIN D 25 HYDROXY (VIT D DEFICIENCY, FRACTURES): Vit D, 25-Hydroxy: <4 ng/mL — ABNORMAL LOW (ref 30.0–100.0)

## 2020-07-01 LAB — HEPATITIS C ANTIBODY: Hep C Virus Ab: <0.1 s/co ratio (ref 0.0–0.9)

## 2020-07-01 LAB — HEPATITIS B SURFACE ANTIBODY,QUALITATIVE: Hep B Surface Ab, Qual: NONREACTIVE

## 2020-07-01 LAB — HEPATITIS B SURFACE ANTIGEN: Hepatitis B Surface Ag: NEGATIVE

## 2020-07-01 LAB — HEPATITIS B CORE ANTIBODY, IGM: Hep B C IgM: NEGATIVE

## 2020-07-02 DIAGNOSIS — M25551 Pain in right hip: Secondary | ICD-10-CM | POA: Insufficient documentation

## 2020-07-02 NOTE — Assessment & Plan Note (Addendum)
His chronic pain and cramps has not been completely controlled with flexeril and gabapentin. He gets cramps in both of his lower legs, especially at night. He has had progressive pain in his right hip, he thinks due to the way he walks. This is what is hurting the most today. The pain is in the anterior part of the hip and hurts with all movement. He thinks he may have felt a mass there once.   On physical exam he is very deconditioned in both legs, especially with hip flexion. Passive and active ROM is pain and he has limited ROM on exam due to this. No popping of the hip joint. No inguinal hernia on exam.  He has previously done physical therapy but is open to doing this again to try and increase strength  - xr right hip  - increase gabapentin 600 mg bid to tid  - referral placed to physical therapy  - refill flexeril  - check vitamin d

## 2020-07-02 NOTE — Assessment & Plan Note (Signed)
See "right hip pain"
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
## 2020-07-02 NOTE — Assessment & Plan Note (Signed)
He is drinking about a 40oz on Fridays and 6-12 beers on Saturdays. He usually no longer drinks during the week. Recent transaminitis and elevated alk phos which have improved in November, he also had RUQ [place] which showed likely hepatitis. Likely secondary to alcohol use.   - congratulated him on cutting back and encouraged continued effort toward cessation  - check hepc and hep b labs
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
## 2020-07-06 ENCOUNTER — Encounter: Payer: Self-pay | Admitting: Internal Medicine

## 2020-07-06 DIAGNOSIS — E559 Vitamin D deficiency, unspecified: Secondary | ICD-10-CM

## 2020-07-06 NOTE — Assessment & Plan Note (Signed)
ADDENDUM: history of vit D deficiency. Vitamin D <4. Unable to leave VM but ordered 50,000 U qweekly for 12 weeks and will have him follow-up for repeat vitamin D I 3 months. Letter mailed.

## 2020-07-06 NOTE — Progress Notes (Signed)
Called to discuss results. No answer, VM full. Will mail results.

## 2020-07-06 NOTE — Progress Notes (Signed)
Internal Medicine Clinic Attending  Case discussed with Dr. Seawell  At the time of the visit.  We reviewed the resident's history and exam and pertinent patient test results.  I agree with the assessment, diagnosis, and plan of care documented in the resident's note.  

## 2020-07-09 ENCOUNTER — Encounter: Payer: Self-pay | Admitting: Physical Therapy

## 2020-07-09 MED FILL — CYCLOBENZAPRINE HCL 5 MG TA: 5 | 21 days supply | Qty: 21 | Fill #0

## 2020-07-09 MED FILL — LISINOPRIL 10 MG TABS: 10 | 90 days supply | Qty: 90 | Fill #0

## 2020-07-09 MED FILL — DULoxetine HCL 60 MG CPEP: 60 | 90 days supply | Qty: 90 | Fill #0

## 2020-07-09 MED FILL — GABAPENTIN 300 MG CAPSULE: 300 | 30 days supply | Qty: 180 | Fill #0

## 2020-07-09 NOTE — Therapy (Signed)
[facility] [address] [place], [place], [address] Phone: [phone]   Fax:  [phone]  Patient Details  Name: Zac Torti MRN: [id number] Date of Birth: 07/13/1965 Referring Provider:  No ref. provider found  Encounter Date: 07/09/2020  PHYSICAL THERAPY DISCHARGE SUMMARY  Visits from Start of Care: 4  Current functional level related to goals / functional outcomes: Not able to assess goals due to pt not returning since last visit.  Plan: Patient agrees to discharge.  Patient goals were not met. Patient is being discharged due to not returning since the last visit.  ?????       Arliss Journey, PT, DPT 07/09/2020, 1:02 PM  [facility] [address] [place], [place], [address] Phone: [phone]   Fax:  [phone]

## 2020-07-14 ENCOUNTER — Encounter: Payer: Self-pay | Admitting: Student

## 2020-07-22 ENCOUNTER — Other Ambulatory Visit: Payer: Self-pay

## 2020-07-22 ENCOUNTER — Ambulatory Visit: Payer: Medicaid Other | Admitting: Behavioral Health

## 2020-07-22 DIAGNOSIS — F330 Major depressive disorder, recurrent, mild: Secondary | ICD-10-CM

## 2020-07-22 DIAGNOSIS — F1010 Alcohol abuse, uncomplicated: Secondary | ICD-10-CM

## 2020-07-22 NOTE — BH Specialist Note (Signed)
Integrated Behavioral Health via Telemedicine Visit  07/22/2020 [name] [id number]  Number of [name] visits: 1/6 Session Start time: 11:00am  Session End time: 11:50am Total time: 50   Referring Provider: Dr. Molli Hazard, DO Patient/Family location: Pt is outside smoking a cigarette in private [facility] Provider location: [facility] Office All persons participating in visit: Pt & Clinician Types of Service: Individual psychotherapy  I connected with [name] and/or [name] self by Telephone  (Video is Tree surgeon) and verified that I am speaking with the correct person using two identifiers.Discussed confidentiality: Yes   I discussed the limitations of telemedicine and the availability of in person appointments.  Discussed there is a possibility of technology failure and discussed alternative modes of communication if that failure occurs.  I discussed that engaging in this telemedicine visit, they consent to the provision of behavioral healthcare and the services will be billed under their insurance.  Patient and/or legal guardian expressed understanding and consented to Telemedicine visit: Yes   Presenting Concerns: Patient and/or family reports the following symptoms/concerns: Sx of dep; Pt shared he has felt much improved since quitting his job in early Jan. Work was stressful & felt no appreciation. Pt self was not served in that job. Duration of problem: Pt has suffered from dep for many yrs-it has caused irritability & extreme sadness at times. Pt finally secured Disability in Aug 2021-this financial assistance has helped him transform his life; Severity of problem: moderate to severe  Patient and/or Family's Strengths/Protective Factors: Social connections, Social and Emotional competence, Concrete supports in place (healthy food, safe environments, etc.) and Sense of purpose, Pt has a positive attitude about life & is good to other ppl. He  & his Fiance have been tgthr for 17+ yrs. She has noticed he is doing better since Jan of this year. His friend of 30+ yrs has also been a great support to his efforts w/making his life a better place.  Goals Addressed: Patient will: 1.  Reduce symptoms of: anxiety and depression  2.  Increase knowledge and/or ability of: healthy habits and Pt self care practices to inc his life satisfaction. Pt loves to fish. 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   3.  Demonstrate ability to: Increase healthy adjustment to current life circumstances  Progress towards Goals: Estb'd today: Pt will explore purchasing a Cello, he will cont to moderate his drinking (now only drinking beer, but throughout the week). Pt is aware of tobacco cessation-encouraged Pt in all his lifestyle modification.  Interventions: Interventions utilized:  Motivational Interviewing, Behavioral Activation and Supportive Counseling Standardized Assessments completed: Not Needed  Patient and/or Family Response: Pt receptive to conversation w/Clinician today. He wants to cont w/sessions every other week. Pt has inc'd motivation to find things to keep himself busy to promote his mental wellness & moderation of unhealthy habits.  Assessment: Patient currently experiencing reduced Sx of dep due to his own efforts w/lifestyle changes. Pt related Hx of SI/SA in 14-Oct-1999 after the death of his Wife. He attempted to overdose on pills in the home. He is currently neg for SI/SA .  Patient may benefit from cont'd encouragement & support from an objective outside person. Psychoedu about tobacco cessation & ETOH moderation. Pt wants to care for self better to enjoy a full life.   Plan: 1. Follow up with behavioral health clinician on : 2 wks via telehealth 2. Behavioral recommendations: Cont  your current efforts & make ea day a better one 3. Referral(s): [place] (In  Clinic)  I discussed the assessment and treatment plan with the patient and/or parent/guardian. They were provided an opportunity to ask questions and all were answered. They agreed with the plan and demonstrated an understanding of the instructions.   They were advised to call back or seek an in-person evaluation if the symptoms worsen or if the condition fails to improve as anticipated.  Donnetta Hutching, LMFT

## 2020-08-02 ENCOUNTER — Ambulatory Visit: Payer: Medicaid Other | Attending: Internal Medicine | Admitting: Physical Therapy

## 2020-08-03 NOTE — Addendum Note (Signed)
Addended by: Yvonna Alanis E on: 08/03/2020 12:30 PM   Modules accepted: Orders

## 2020-08-05 ENCOUNTER — Other Ambulatory Visit: Payer: Self-pay

## 2020-08-05 ENCOUNTER — Ambulatory Visit: Payer: Medicaid Other | Admitting: Behavioral Health

## 2020-08-05 DIAGNOSIS — F331 Major depressive disorder, recurrent, moderate: Secondary | ICD-10-CM

## 2020-08-05 NOTE — BH Specialist Note (Signed)
Integrated Behavioral Health via Telemedicine Visit  08/05/2020 [name] [id number]  Number of [place] visits: 2/6 Session Start time: 11:30am  Session End time: 12:15pm Total time: 45   Referring Provider: Dr. Johnney Ou, MD Patient/Family location: Pt in private at home [facility] Provider location: [facility] Office All persons participating in visit: Pt & Clinician Types of Service: Individual psychotherapy  I connected with [name] and/or [name] self by Telephone  (Video is Tree surgeon) and verified that I am speaking with the correct person using two identifiers.Discussed confidentiality: Yes   I discussed the limitations of telemedicine and the availability of in person appointments.  Discussed there is a possibility of technology failure and discussed alternative modes of communication if that failure occurs.  I discussed that engaging in this telemedicine visit, they consent to the provision of behavioral healthcare and the services will be billed under their insurance.  Patient and/or legal guardian expressed understanding and consented to Telemedicine visit: Yes   Presenting Concerns: Patient and/or family reports the following symptoms/concerns: Elevated Sx of dep due to recent deaths in the family; Paternal Aunt & Maternal Uncle  Duration of problem: chronic dep worsened due to recent losses of family members; Severity of problem: moderate  Patient and/or Family's Strengths/Protective Factors: Social connections, Social and Emotional competence, Concrete supports in place (healthy food, safe environments, etc.) and Physical Health (exercise, healthy diet, medication compliance, etc.)  Goals Addressed: Patient will: 1.  Reduce symptoms of: depression and assist in grieving process-understanding & acceptance of self  2.  Increase knowledge and/or ability of: stress reduction  3.  Demonstrate ability to: Increase healthy adjustment to  current life circumstances  Progress towards Goals: Ongoing  Interventions: Interventions utilized:  Behavioral Activation and Supportive Counseling Standardized Assessments completed: Not Needed  Patient and/or Family Response: Pt receptive to call today & wants f/u care.  Assessment: Patient currently experiencing elevated dep symptomology due to deep feelings of grief & loss. Pt attempting to moderate drinking, but this is challenging when he hurts so much.   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 This situation feels defeating; not working has disrupted his routine, although he is grateful to be gone from his job & able to receive Disability. Pt is looking forward to getting married when they set a date.   Pt shared his boredom & tendency to over-think issues, esp'ly when he is alone. Discussed ways Pt can combat this by talking to "the Other Mikki [place]" & instructing him to leave [place] alone. Pt uses this skill to cope.  Patient may benefit from cont'd discussion in therapy sessions, laughter, pursuing his love of fishing.   Pt very grateful for session today. He cried this morning over recent death of beloved family members. Discussed his gendered concept of crying & challenged this perspective by asserting the research on crying.   Plan: 1. Follow up with behavioral health clinician on : 2 wks from now; one hour on telehealth 2. Behavioral recommendations: Meet good friend Percell Miller this week, cont to moderate intake of ETOH, reach out to Fiance more-try to trust her enough to be vulnerable when needed, enjoy your [place] & workout! 3. Referral(s): [place] (In Clinic)  I discussed the assessment and treatment plan with the patient and/or parent/guardian. They were provided an opportunity to ask questions and all were answered. They agreed with the plan and demonstrated an understanding of the instructions.   They were advised to call back or seek an  in-person evaluation if the symptoms worsen or if the condition fails to improve as anticipated.  Donnetta Hutching, LMFT

## 2020-08-19 ENCOUNTER — Ambulatory Visit: Payer: Medicaid Other | Admitting: Behavioral Health

## 2020-08-19 DIAGNOSIS — F331 Major depressive disorder, recurrent, moderate: Secondary | ICD-10-CM

## 2020-08-19 NOTE — BH Specialist Note (Signed)
Integrated Behavioral Health via Telemedicine Visit  08/19/2020 [name] [id number]  Number of [place] visits: 3/6 Session Start time: 2:10pm  Session End time: 2:40pm Total time: 30  Referring Provider: Dr. Johnney Ou, MD Patient/Family location: Pt w/his Father at home [facility] Provider location: [facility] Office All persons participating in visit: Pt, Clinician & Pt's Father Types of Service: Family psychotherapy  I connected with [name] and/or [name] father by Telephone  (Video is Tree surgeon) and verified that I am speaking with the correct person using two identifiers.Discussed confidentiality: Yes   I discussed the limitations of telemedicine and the availability of in person appointments.  Discussed there is a possibility of technology failure and discussed alternative modes of communication if that failure occurs.  I discussed that engaging in this telemedicine visit, they consent to the provision of behavioral healthcare and the services will be billed under their insurance.  Patient and/or legal guardian expressed understanding and consented to Telemedicine visit: Yes   Presenting Concerns: Patient and/or family reports the following symptoms/concerns: elevated concern for his beh w/Fiance & GDtr's safety Duration of problem: one day; Severity of problem: mild  Patient and/or Family's Strengths/Protective Factors: Social connections, Social and Emotional competence, Concrete supports in place (healthy food, safe environments, etc.) and Sense of purpose, even during retirement  Goals Addressed: Patient will: 1.  Reduce symptoms of: anxiety and depression  2.  Increase knowledge and/or ability of: healthy habits  3.  Demonstrate ability to: Increase healthy adjustment to current life circumstances  Progress towards Goals: Ongoing  Interventions: Interventions utilized:  Supportive Counseling Standardized Assessments  completed: Not Needed  Patient and/or Family Response: Pt & Parent open to session  Assessment: Patient currently experiencing elevated anxiety over his beh with Fiance last evening.   Patient may benefit from supportive cslg, encouragement, & cont'd reinforcement of Pt goals.  Plan: 1. Follow up with behavioral health clinician on : 3 wks on telehealth for 30 min 2. Behavioral recommendations: try to have discussion w/Fiance 3. Referral(s): [place] (In Clinic)  I discussed the assessment and treatment plan with the patient and/or parent/guardian. They were provided an opportunity to ask questions and all were answered. They agreed with the plan and demonstrated an understanding of the instructions.   They were advised to call back or seek an in-person evaluation if the symptoms worsen or if the condition fails to improve as anticipated.  Donnetta Hutching, LMFT

## 2020-09-03 ENCOUNTER — Telehealth: Payer: Self-pay | Admitting: Student

## 2020-09-03 NOTE — Telephone Encounter (Signed)
Does not look like he has been evaluated for erectile dysfunction by [place] recently He needs to make an appointment if he wants prescription for viagra.

## 2020-09-03 NOTE — Telephone Encounter (Signed)
MED REFILL REQUEST   Requesting 100 pills refill on Viagra at  [place], [place] - [address] [facility]. Phone:  [phone]  Fax:  [phone]

## 2020-09-08 ENCOUNTER — Ambulatory Visit: Payer: Medicaid Other | Admitting: Behavioral Health

## 2020-09-08 ENCOUNTER — Other Ambulatory Visit: Payer: Self-pay

## 2020-09-08 DIAGNOSIS — F331 Major depressive disorder, recurrent, moderate: Secondary | ICD-10-CM

## 2020-09-08 NOTE — BH Specialist Note (Signed)
Integrated Behavioral Health via Telemedicine Visit  09/08/2020 [name] [id number]  Number of [place] visits: 4/6 Session Start time: 1:00pm  Session End time: 1:30pm Total time: 30  Referring Provider: Dr. Morrison Old, MD Patient/Family location: Pt at home in private [facility] Provider location: [facility] Office All persons participating in visit: Pt & Clinician  Types of Service: Individual psychotherapy  I connected with [name] and/or [name]; via  Telephone or Video Enabled Telemedicine Application  (Video is Caregility application) and verified that I am speaking with the correct person using two identifiers. Discussed confidentiality: Yes   I discussed the limitations of telemedicine and the availability of in person appointments.  Discussed there is a possibility of technology failure and discussed alternative modes of communication if that failure occurs.  I discussed that engaging in this telemedicine visit, they consent to the provision of behavioral healthcare and the services will be billed under their insurance.  Patient and/or legal guardian expressed understanding and consented to Telemedicine visit: Yes   Presenting Concerns: Patient and/or family reports the following symptoms/concerns: heightened concerns for beh of Parents Duration of problem: this week; Severity of problem: mild  Patient and/or Family's Strengths/Protective Factors: Social connections, Social and Emotional competence, Concrete supports in place (healthy food, safe environments, etc.) and Physical Health (exercise, healthy diet, medication compliance, etc.)  Goals Addressed: Patient will: 1.  Reduce symptoms of: anxiety and depression  2.  Increase knowledge and/or ability of: coping skills and stress reduction  3.  Demonstrate ability to: Increase healthy adjustment to current life circumstances  Progress towards  Goals: Ongoing  Interventions: Interventions utilized:  Solution-Focused Strategies and Supportive Counseling Standardized Assessments completed: Not Needed  Patient and/or Family Response: Pt receptive to call today openly discussing the beh of his Parents  Assessment: Patient currently experiencing elevated stress due to Mother's beh in public w/others & her Tx of family.   Patient may benefit from cont'd contact w/Clinician regarding family issues.  Plan: 1. Follow up with behavioral health clinician on : 3 wks on telehealth for 30 min 2. Behavioral recommendations: Acceptance of lifespan dvlpmt of family 3. Referral(s): [place] (In Clinic)  I discussed the assessment and treatment plan with the patient and/or parent/guardian. They were provided an opportunity to ask questions and all were answered. They agreed with the plan and demonstrated an understanding of the instructions.   They were advised to call back or seek an in-person evaluation if the symptoms worsen or if the condition fails to improve as anticipated.  Donnetta Hutching, LMFT

## 2020-09-10 ENCOUNTER — Encounter: Payer: Self-pay | Admitting: Student

## 2020-09-10 NOTE — Telephone Encounter (Signed)
Patient has been called with no response.  Mailed letter to the patient .

## 2020-09-26 ENCOUNTER — Other Ambulatory Visit: Payer: Self-pay

## 2020-09-26 ENCOUNTER — Emergency Department (HOSPITAL_COMMUNITY): Payer: Medicaid Other

## 2020-09-26 ENCOUNTER — Inpatient Hospital Stay (HOSPITAL_COMMUNITY)
Admission: EM | Admit: 2020-09-26 | Discharge: 2020-10-02 | DRG: 897 | Disposition: A | Discharge status: Home / Self Care | Payer: Medicaid Other | Attending: Internal Medicine | Admitting: Internal Medicine

## 2020-09-26 ENCOUNTER — Encounter (HOSPITAL_COMMUNITY): Payer: Self-pay | Admitting: Pharmacy Technician

## 2020-09-26 DIAGNOSIS — D6959 Other secondary thrombocytopenia: Secondary | ICD-10-CM | POA: Diagnosis present

## 2020-09-26 DIAGNOSIS — R32 Unspecified urinary incontinence: Secondary | ICD-10-CM | POA: Diagnosis present

## 2020-09-26 DIAGNOSIS — Z20822 Contact with and (suspected) exposure to covid-19: Secondary | ICD-10-CM | POA: Diagnosis present

## 2020-09-26 DIAGNOSIS — K7460 Unspecified cirrhosis of liver: Secondary | ICD-10-CM | POA: Diagnosis present

## 2020-09-26 DIAGNOSIS — W19XXXA Unspecified fall, initial encounter: Secondary | ICD-10-CM

## 2020-09-26 DIAGNOSIS — I471 Supraventricular tachycardia: Secondary | ICD-10-CM | POA: Diagnosis not present

## 2020-09-26 DIAGNOSIS — Z79899 Other long term (current) drug therapy: Secondary | ICD-10-CM

## 2020-09-26 DIAGNOSIS — I10 Essential (primary) hypertension: Secondary | ICD-10-CM | POA: Diagnosis present

## 2020-09-26 DIAGNOSIS — F10939 Alcohol use, unspecified with withdrawal, unspecified: Secondary | ICD-10-CM | POA: Diagnosis present

## 2020-09-26 DIAGNOSIS — Z87898 Personal history of other specified conditions: Secondary | ICD-10-CM

## 2020-09-26 DIAGNOSIS — Z8673 Personal history of transient ischemic attack (TIA), and cerebral infarction without residual deficits: Secondary | ICD-10-CM

## 2020-09-26 DIAGNOSIS — G40909 Epilepsy, unspecified, not intractable, without status epilepticus: Secondary | ICD-10-CM | POA: Diagnosis present

## 2020-09-26 DIAGNOSIS — F17210 Nicotine dependence, cigarettes, uncomplicated: Secondary | ICD-10-CM | POA: Diagnosis present

## 2020-09-26 DIAGNOSIS — F1490 Cocaine use, unspecified, uncomplicated: Secondary | ICD-10-CM | POA: Diagnosis present

## 2020-09-26 DIAGNOSIS — F32A Depression, unspecified: Secondary | ICD-10-CM | POA: Diagnosis present

## 2020-09-26 DIAGNOSIS — D649 Anemia, unspecified: Secondary | ICD-10-CM | POA: Diagnosis present

## 2020-09-26 DIAGNOSIS — M160 Bilateral primary osteoarthritis of hip: Secondary | ICD-10-CM | POA: Diagnosis present

## 2020-09-26 DIAGNOSIS — Z8249 Family history of ischemic heart disease and other diseases of the circulatory system: Secondary | ICD-10-CM

## 2020-09-26 DIAGNOSIS — R55 Syncope and collapse: Secondary | ICD-10-CM | POA: Diagnosis present

## 2020-09-26 DIAGNOSIS — E876 Hypokalemia: Secondary | ICD-10-CM | POA: Diagnosis present

## 2020-09-26 DIAGNOSIS — K7010 Alcoholic hepatitis without ascites: Secondary | ICD-10-CM | POA: Diagnosis present

## 2020-09-26 DIAGNOSIS — F419 Anxiety disorder, unspecified: Secondary | ICD-10-CM | POA: Diagnosis present

## 2020-09-26 DIAGNOSIS — F10239 Alcohol dependence with withdrawal, unspecified: Principal | ICD-10-CM | POA: Diagnosis present

## 2020-09-26 DIAGNOSIS — Y900 Blood alcohol level of less than 20 mg/100 ml: Secondary | ICD-10-CM | POA: Diagnosis present

## 2020-09-26 DIAGNOSIS — M25552 Pain in left hip: Secondary | ICD-10-CM

## 2020-09-26 LAB — TROPONIN I (HIGH SENSITIVITY)
Troponin I (High Sensitivity): 92 ng/L — ABNORMAL HIGH (ref <18)
Troponin I (High Sensitivity): 100 ng/L (ref <18)

## 2020-09-26 LAB — CBC WITH DIFFERENTIAL/PLATELET
Abs Immature Granulocytes: 0.02 10*3/uL (ref 0.00–0.07)
Basophils Absolute: 0 10*3/uL (ref 0.0–0.1)
Basophils Relative: 0 %
Eosinophils Absolute: 0 10*3/uL (ref 0.0–0.5)
Eosinophils Relative: 0 %
HCT: 40.6 % (ref 39.0–52.0)
Hemoglobin: 13.7 g/dL (ref 13.0–17.0)
Immature Granulocytes: 0 %
Lymphocytes Relative: 14 %
Lymphs Abs: 0.6 10*3/uL — ABNORMAL LOW (ref 0.7–4.0)
MCH: 34.8 pg — ABNORMAL HIGH (ref 26.0–34.0)
MCHC: 33.7 g/dL (ref 30.0–36.0)
MCV: 103 fL — ABNORMAL HIGH (ref 80.0–100.0)
Monocytes Absolute: 0.7 10*3/uL (ref 0.1–1.0)
Monocytes Relative: 15 %
Neutro Abs: 3.1 10*3/uL (ref 1.7–7.7)
Neutrophils Relative %: 71 %
Platelets: 54 10*3/uL — ABNORMAL LOW (ref 150–400)
RBC: 3.94 MIL/uL — ABNORMAL LOW (ref 4.22–5.81)
RDW: 13.3 % (ref 11.5–15.5)
WBC: 4.5 10*3/uL (ref 4.0–10.5)
nRBC: 0 % (ref 0.0–0.2)

## 2020-09-26 LAB — COMPREHENSIVE METABOLIC PANEL
ALT: 59 U/L — ABNORMAL HIGH (ref 0–44)
AST: 150 U/L — ABNORMAL HIGH (ref 15–41)
Albumin: 3.5 g/dL (ref 3.5–5.0)
Alkaline Phosphatase: 130 U/L — ABNORMAL HIGH (ref 38–126)
Anion gap: 10 (ref 5–15)
BUN: 5 mg/dL — ABNORMAL LOW (ref 6–20)
CO2: 26 mmol/L (ref 22–32)
Calcium: 9.1 mg/dL (ref 8.9–10.3)
Chloride: 99 mmol/L (ref 98–111)
Creatinine, Ser: 0.73 mg/dL (ref 0.61–1.24)
GFR, Estimated: >60 mL/min (ref >60)
Glucose, Bld: 152 mg/dL — ABNORMAL HIGH (ref 70–99)
Potassium: 3.7 mmol/L (ref 3.5–5.1)
Sodium: 135 mmol/L (ref 135–145)
Total Bilirubin: 2 mg/dL — ABNORMAL HIGH (ref 0.3–1.2)
Total Protein: 7.1 g/dL (ref 6.5–8.1)

## 2020-09-26 LAB — RESP PANEL BY RT-PCR (FLU A&B, COVID) ARPGX2
Influenza A by PCR: NEGATIVE
Influenza B by PCR: NEGATIVE
SARS Coronavirus 2 by RT PCR: NEGATIVE

## 2020-09-26 LAB — ETHANOL: Alcohol, Ethyl (B): <10 mg/dL (ref <10)

## 2020-09-26 LAB — CBG MONITORING, ED: Glucose-Capillary: 131 mg/dL — ABNORMAL HIGH (ref 70–99)

## 2020-09-26 LAB — BRAIN NATRIURETIC PEPTIDE: B Natriuretic Peptide: 73 pg/mL (ref 0.0–100.0)

## 2020-09-26 MED ORDER — ALBUTEROL SULFATE HFA 108 (90 BASE) MCG/ACT IN AERS
2.0000 | INHALATION_SPRAY | Freq: Once | RESPIRATORY_TRACT | Status: DC
  Filled 2020-09-26: qty 6.7

## 2020-09-26 MED ORDER — FENTANYL CITRATE (PF) 100 MCG/2ML IJ SOLN
50.0000 ug | Freq: Once | INTRAMUSCULAR | Status: DC
Start: 2020-09-26 — End: 2020-09-26

## 2020-09-26 MED ORDER — DICLOFENAC SODIUM 1 % EX GEL: 2.0000 g | Freq: Four times a day (QID) | CUTANEOUS | Status: DC | PRN

## 2020-09-26 MED ORDER — IBUPROFEN 600 MG PO TABS
600.0000 mg | ORAL_TABLET | Freq: Every day | ORAL | Status: DC | PRN
  Administered 2020-09-27 – 2020-10-01 (×2): 600 mg via ORAL
  Filled 2020-09-26 (×2): qty 1

## 2020-09-26 MED ORDER — FOLIC ACID 1 MG PO TABS
1.0000 mg | ORAL_TABLET | Freq: Every day | ORAL | Status: DC
  Administered 2020-09-26 – 2020-10-02 (×7): 1 mg via ORAL
  Filled 2020-09-26 (×7): qty 1

## 2020-09-26 MED ORDER — ADULT MULTIVITAMIN W/MINERALS CH
1.0000 | ORAL_TABLET | Freq: Every day | ORAL | Status: DC
  Administered 2020-09-26 – 2020-10-02 (×7): 1 via ORAL
  Filled 2020-09-26 (×7): qty 1

## 2020-09-26 MED ORDER — LORAZEPAM 2 MG/ML IJ SOLN
0.0000 mg | Freq: Two times a day (BID) | INTRAMUSCULAR | Status: DC
  Administered 2020-09-29: 2 mg via INTRAVENOUS
  Filled 2020-09-26: qty 1

## 2020-09-26 MED ORDER — DIVALPROEX SODIUM 250 MG PO DR TAB
250.0000 mg | DELAYED_RELEASE_TABLET | Freq: Two times a day (BID) | ORAL | Status: DC
  Administered 2020-09-26 – 2020-10-02 (×11): 250 mg via ORAL
  Filled 2020-09-26 (×13): qty 1

## 2020-09-26 MED ORDER — ACETAMINOPHEN 650 MG RE SUPP: 650.0000 mg | Freq: Four times a day (QID) | RECTAL | Status: DC | PRN

## 2020-09-26 MED ORDER — NICOTINE 14 MG/24HR TD PT24
14.0000 mg | MEDICATED_PATCH | Freq: Every day | TRANSDERMAL | Status: DC
  Administered 2020-09-26 – 2020-10-02 (×7): 14 mg via TRANSDERMAL
  Filled 2020-09-26 (×7): qty 1

## 2020-09-26 MED ORDER — THIAMINE HCL 100 MG/ML IJ SOLN
100.0000 mg | Freq: Every day | INTRAMUSCULAR | Status: DC
  Administered 2020-09-26 – 2020-09-30 (×2): 100 mg via INTRAVENOUS
  Filled 2020-09-26 (×3): qty 2

## 2020-09-26 MED ORDER — LORAZEPAM 2 MG/ML IJ SOLN
1.0000 mg | Freq: Once | INTRAMUSCULAR | Status: AC
  Administered 2020-09-26: 1 mg via INTRAVENOUS
  Filled 2020-09-26: qty 1

## 2020-09-26 MED ORDER — LORAZEPAM 1 MG PO TABS
0.0000 mg | ORAL_TABLET | Freq: Four times a day (QID) | ORAL | Status: DC
  Administered 2020-09-27 – 2020-09-28 (×2): 1 mg via ORAL
  Filled 2020-09-26: qty 1
  Filled 2020-09-26: qty 4

## 2020-09-26 MED ORDER — LORAZEPAM 1 MG PO TABS: 0.0000 mg | ORAL_TABLET | Freq: Two times a day (BID) | ORAL | Status: DC

## 2020-09-26 MED ORDER — LISINOPRIL 10 MG PO TABS
10.0000 mg | ORAL_TABLET | Freq: Every day | ORAL | Status: DC
  Administered 2020-09-27 – 2020-10-02 (×6): 10 mg via ORAL
  Filled 2020-09-26 (×6): qty 1

## 2020-09-26 MED ORDER — SODIUM CHLORIDE 0.9 % IV BOLUS
1000.0000 mL | Freq: Once | INTRAVENOUS | Status: AC
  Administered 2020-09-26: 1000 mL via INTRAVENOUS

## 2020-09-26 MED ORDER — DULOXETINE HCL 60 MG PO CPEP
60.0000 mg | ORAL_CAPSULE | Freq: Every day | ORAL | Status: DC
  Administered 2020-09-27 – 2020-10-02 (×6): 60 mg via ORAL
  Filled 2020-09-26 (×6): qty 1

## 2020-09-26 MED ORDER — ACETAMINOPHEN 325 MG PO TABS
650.0000 mg | ORAL_TABLET | Freq: Four times a day (QID) | ORAL | Status: DC | PRN
Start: 2020-09-26 — End: 2020-09-26

## 2020-09-26 MED ORDER — CHLORDIAZEPOXIDE HCL 25 MG PO CAPS
25.0000 mg | ORAL_CAPSULE | Freq: Three times a day (TID) | ORAL | Status: DC
  Administered 2020-09-26 – 2020-09-27 (×5): 25 mg via ORAL
  Filled 2020-09-26 (×5): qty 1

## 2020-09-26 MED ORDER — LORAZEPAM 2 MG/ML IJ SOLN
0.0000 mg | Freq: Four times a day (QID) | INTRAMUSCULAR | Status: DC
  Administered 2020-09-28: 1 mg via INTRAVENOUS
  Filled 2020-09-26: qty 1

## 2020-09-26 MED ORDER — THIAMINE HCL 100 MG PO TABS
100.0000 mg | ORAL_TABLET | Freq: Every day | ORAL | Status: DC
  Administered 2020-09-27 – 2020-10-02 (×5): 100 mg via ORAL
  Filled 2020-09-26 (×5): qty 1

## 2020-09-26 MED ORDER — SODIUM CHLORIDE 0.9% FLUSH
3.0000 mL | Freq: Two times a day (BID) | INTRAVENOUS | Status: DC
  Administered 2020-09-27 – 2020-10-02 (×11): 3 mL via INTRAVENOUS

## 2020-09-26 NOTE — ED Notes (Signed)
Writer into room to discharge pt. Pt was staggering in the room and incontinent of bladder and bowels in the floor. Pt was unsteady on feet and this RN was not comfortable with the pt leaving. This RN reported to the MD about pt status. MD into eval. Pt and family member agreeable for the pt to stay. Pt was cleaned up and placed back in the bed. Bed is locked and in its lowest position, connected to the monitor and given call bell. Pt states understanding of using the call bell before attempting to get out of bed by himself.

## 2020-09-26 NOTE — ED Triage Notes (Signed)
Pt bib ems from home after friend found pt knees on the side of the bathtub and head down inside of it. Pt was flailing his arms stating he cant breathe. Friend reports pt drinks etoh, did not hear patient fall. Pt arrives with ccollar in place, alert to voice. Pt only alert to self at this time. Abdomen distended and hard to RUQ. VSS with ems.

## 2020-09-26 NOTE — ED Notes (Signed)
Patient transported to CT 
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
## 2020-09-26 NOTE — ED Provider Notes (Signed)
Signout from Dr. Tamera Punt.  55 year old male here after possible fall.  CAT scan showed possible infarct brain, unclear age.  He is pending an MRI brain.  If not acute can be discharged to follow-up with his PCP. Physical Exam  BP 135/76   Pulse 80   Temp 98 F (36.7 C) (Axillary)   Resp 13   SpO2 94%   Physical Exam  ED Course/Procedures     .Critical Care Performed by: Hayden Rasmussen, MD Authorized by: Hayden Rasmussen, MD   Critical care provider statement:    Critical care time (minutes):  45   Critical care time was exclusive of:  Separately billable procedures and treating other patients   Critical care was necessary to treat or prevent imminent or life-threatening deterioration of the following conditions:  CNS failure or compromise   Critical care was time spent personally by me on the following activities:  Discussions with consultants, evaluation of patient's response to treatment, examination of patient, ordering and performing treatments and interventions, ordering and review of laboratory studies, ordering and review of radiographic studies, pulse oximetry, re-evaluation of patient's condition, obtaining history from patient or surrogate, review of old charts and development of treatment plan with patient or surrogate    MDM  1630 -acute change in condition.  Nurse asked me to evaluate patient.  He is diaphoretic tachypneic tachycardic.  Wheezing on auscultation.  Not answering questions.  Ordered an EKG troponins portable chest x-ray BNP.  Ativan and albuterol inhaler.  1720.  After Ativan patient looks much better.  Mental status is actually a little bit better and he is not diaphoretic and tachycardia has resolved.  Satting 100% on room air.  We will try to get him over for his MRI.  2015 -troponins have gone up from 93-100.  I think this is all related to his episode of diaphoresis and likely withdrawal.  He denies any chest pain and wants to go home.  I think this is  reasonable.  The MRI did not show any acute findings although it was not an optimal study due to motion.  Patient spouse is here and she is ready to take him home  2100 -patient unsteady on his feet.  Was incontinent of stool in his urine.  Still wants to go home but spouse is begging with him to stay in the hospital.  I also concur that the may be the safe measure.  Have ordered him some IV Ativan and thiamine and put him on a CIWA.  Discussed with the internal medicine teaching service who will evaluate the patient for admission.     Hayden Rasmussen, MD 09/27/20 1023

## 2020-09-26 NOTE — ED Notes (Signed)
Pt back from CT

## 2020-09-26 NOTE — ED Notes (Signed)
[name], mother, [phone] would like an update when available
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
## 2020-09-26 NOTE — ED Notes (Signed)
While doing hourly rounding, pt noted to be wheezing, diaphoretic and worsening ams. EDP to the bedside to assess pt.

## 2020-09-27 DIAGNOSIS — R32 Unspecified urinary incontinence: Secondary | ICD-10-CM | POA: Diagnosis present

## 2020-09-27 DIAGNOSIS — R55 Syncope and collapse: Secondary | ICD-10-CM | POA: Diagnosis not present

## 2020-09-27 DIAGNOSIS — D649 Anemia, unspecified: Secondary | ICD-10-CM | POA: Diagnosis present

## 2020-09-27 DIAGNOSIS — F32A Depression, unspecified: Secondary | ICD-10-CM | POA: Diagnosis present

## 2020-09-27 DIAGNOSIS — F17210 Nicotine dependence, cigarettes, uncomplicated: Secondary | ICD-10-CM | POA: Diagnosis present

## 2020-09-27 DIAGNOSIS — E876 Hypokalemia: Secondary | ICD-10-CM | POA: Diagnosis present

## 2020-09-27 DIAGNOSIS — D696 Thrombocytopenia, unspecified: Secondary | ICD-10-CM | POA: Diagnosis not present

## 2020-09-27 DIAGNOSIS — M160 Bilateral primary osteoarthritis of hip: Secondary | ICD-10-CM | POA: Diagnosis present

## 2020-09-27 DIAGNOSIS — F10239 Alcohol dependence with withdrawal, unspecified: Secondary | ICD-10-CM | POA: Diagnosis present

## 2020-09-27 DIAGNOSIS — D6959 Other secondary thrombocytopenia: Secondary | ICD-10-CM | POA: Diagnosis present

## 2020-09-27 DIAGNOSIS — Z8673 Personal history of transient ischemic attack (TIA), and cerebral infarction without residual deficits: Secondary | ICD-10-CM | POA: Diagnosis not present

## 2020-09-27 DIAGNOSIS — Z20822 Contact with and (suspected) exposure to covid-19: Secondary | ICD-10-CM | POA: Diagnosis present

## 2020-09-27 DIAGNOSIS — Z79899 Other long term (current) drug therapy: Secondary | ICD-10-CM | POA: Diagnosis not present

## 2020-09-27 DIAGNOSIS — Z8249 Family history of ischemic heart disease and other diseases of the circulatory system: Secondary | ICD-10-CM | POA: Diagnosis not present

## 2020-09-27 DIAGNOSIS — I471 Supraventricular tachycardia: Secondary | ICD-10-CM

## 2020-09-27 DIAGNOSIS — R945 Abnormal results of liver function studies: Secondary | ICD-10-CM | POA: Diagnosis not present

## 2020-09-27 DIAGNOSIS — I10 Essential (primary) hypertension: Secondary | ICD-10-CM | POA: Diagnosis present

## 2020-09-27 DIAGNOSIS — K7010 Alcoholic hepatitis without ascites: Secondary | ICD-10-CM | POA: Diagnosis present

## 2020-09-27 DIAGNOSIS — R400 Somnolence: Secondary | ICD-10-CM | POA: Diagnosis not present

## 2020-09-27 DIAGNOSIS — K7460 Unspecified cirrhosis of liver: Secondary | ICD-10-CM | POA: Diagnosis present

## 2020-09-27 DIAGNOSIS — F10939 Alcohol use, unspecified with withdrawal, unspecified: Secondary | ICD-10-CM | POA: Diagnosis not present

## 2020-09-27 DIAGNOSIS — G40909 Epilepsy, unspecified, not intractable, without status epilepticus: Secondary | ICD-10-CM | POA: Diagnosis present

## 2020-09-27 DIAGNOSIS — Y900 Blood alcohol level of less than 20 mg/100 ml: Secondary | ICD-10-CM | POA: Diagnosis present

## 2020-09-27 DIAGNOSIS — F1490 Cocaine use, unspecified, uncomplicated: Secondary | ICD-10-CM | POA: Diagnosis present

## 2020-09-27 DIAGNOSIS — F419 Anxiety disorder, unspecified: Secondary | ICD-10-CM | POA: Diagnosis present

## 2020-09-27 LAB — LIPID PANEL
Cholesterol: 187 mg/dL (ref 0–200)
HDL: 36 mg/dL — ABNORMAL LOW (ref >40)
LDL Cholesterol: 125 mg/dL — ABNORMAL HIGH (ref 0–99)
Total CHOL/HDL Ratio: 5.2 RATIO
Triglycerides: 131 mg/dL (ref <150)
VLDL: 26 mg/dL (ref 0–40)

## 2020-09-27 LAB — COMPREHENSIVE METABOLIC PANEL
ALT: 54 U/L — ABNORMAL HIGH (ref 0–44)
AST: 126 U/L — ABNORMAL HIGH (ref 15–41)
Albumin: 3.4 g/dL — ABNORMAL LOW (ref 3.5–5.0)
Alkaline Phosphatase: 122 U/L (ref 38–126)
Anion gap: 9 (ref 5–15)
BUN: <5 mg/dL — ABNORMAL LOW (ref 6–20)
CO2: 26 mmol/L (ref 22–32)
Calcium: 9.3 mg/dL (ref 8.9–10.3)
Chloride: 101 mmol/L (ref 98–111)
Creatinine, Ser: 0.83 mg/dL (ref 0.61–1.24)
GFR, Estimated: >60 mL/min (ref >60)
Glucose, Bld: 166 mg/dL — ABNORMAL HIGH (ref 70–99)
Potassium: 3.3 mmol/L — ABNORMAL LOW (ref 3.5–5.1)
Sodium: 136 mmol/L (ref 135–145)
Total Bilirubin: 1.9 mg/dL — ABNORMAL HIGH (ref 0.3–1.2)
Total Protein: 7.1 g/dL (ref 6.5–8.1)

## 2020-09-27 LAB — TROPONIN I (HIGH SENSITIVITY)
Troponin I (High Sensitivity): 99 ng/L — ABNORMAL HIGH (ref <18)
Troponin I (High Sensitivity): 96 ng/L — ABNORMAL HIGH (ref <18)

## 2020-09-27 LAB — BASIC METABOLIC PANEL
Anion gap: 9 (ref 5–15)
BUN: <5 mg/dL — ABNORMAL LOW (ref 6–20)
CO2: 25 mmol/L (ref 22–32)
Calcium: 9.2 mg/dL (ref 8.9–10.3)
Chloride: 103 mmol/L (ref 98–111)
Creatinine, Ser: 0.72 mg/dL (ref 0.61–1.24)
GFR, Estimated: >60 mL/min (ref >60)
Glucose, Bld: 99 mg/dL (ref 70–99)
Potassium: 3.3 mmol/L — ABNORMAL LOW (ref 3.5–5.1)
Sodium: 137 mmol/L (ref 135–145)

## 2020-09-27 LAB — IRON AND TIBC
Iron: 61 ug/dL (ref 45–182)
Saturation Ratios: 16 % — ABNORMAL LOW (ref 17.9–39.5)
TIBC: 386 ug/dL (ref 250–450)
UIBC: 325 ug/dL

## 2020-09-27 LAB — TSH: TSH: 1.536 u[IU]/mL (ref 0.350–4.500)

## 2020-09-27 LAB — FOLATE: Folate: 27.6 ng/mL (ref >5.9)

## 2020-09-27 LAB — CBC
HCT: 39.1 % (ref 39.0–52.0)
Hemoglobin: 13.3 g/dL (ref 13.0–17.0)
MCH: 34.5 pg — ABNORMAL HIGH (ref 26.0–34.0)
MCHC: 34 g/dL (ref 30.0–36.0)
MCV: 101.6 fL — ABNORMAL HIGH (ref 80.0–100.0)
Platelets: 56 10*3/uL — ABNORMAL LOW (ref 150–400)
RBC: 3.85 MIL/uL — ABNORMAL LOW (ref 4.22–5.81)
RDW: 13.4 % (ref 11.5–15.5)
WBC: 8 10*3/uL (ref 4.0–10.5)
nRBC: 0 % (ref 0.0–0.2)

## 2020-09-27 LAB — PROTIME-INR
INR: 1.2 (ref 0.8–1.2)
Prothrombin Time: 14.3 seconds (ref 11.4–15.2)

## 2020-09-27 LAB — VITAMIN B12: Vitamin B-12: 1341 pg/mL — ABNORMAL HIGH (ref 180–914)

## 2020-09-27 LAB — APTT: aPTT: 30 seconds (ref 24–36)

## 2020-09-27 LAB — FERRITIN: Ferritin: 1356 ng/mL — ABNORMAL HIGH (ref 24–336)

## 2020-09-27 LAB — RETICULOCYTES
Immature Retic Fract: 21.5 % — ABNORMAL HIGH (ref 2.3–15.9)
RBC.: 3.84 MIL/uL — ABNORMAL LOW (ref 4.22–5.81)
Retic Count, Absolute: 71 10*3/uL (ref 19.0–186.0)
Retic Ct Pct: 1.9 % (ref 0.4–3.1)

## 2020-09-27 LAB — HIV ANTIBODY (ROUTINE TESTING W REFLEX): HIV Screen 4th Generation wRfx: NONREACTIVE

## 2020-09-27 LAB — PATHOLOGIST SMEAR REVIEW

## 2020-09-27 LAB — HEMOGLOBIN A1C
Hgb A1c MFr Bld: 5.3 % (ref 4.8–5.6)
Mean Plasma Glucose: 105.41 mg/dL

## 2020-09-27 LAB — MAGNESIUM: Magnesium: 2.2 mg/dL (ref 1.7–2.4)

## 2020-09-27 MED ORDER — ADENOSINE 6 MG/2ML IV SOLN
INTRAVENOUS | Status: AC
  Administered 2020-09-27: 6 mg
  Filled 2020-09-27: qty 2

## 2020-09-27 MED ORDER — POTASSIUM CHLORIDE 10 MEQ/100ML IV SOLN
10.0000 meq | INTRAVENOUS | Status: AC
  Administered 2020-09-27 – 2020-09-28 (×4): 10 meq via INTRAVENOUS
  Filled 2020-09-27 (×4): qty 100

## 2020-09-27 MED ORDER — LACTATED RINGERS IV BOLUS
1000.0000 mL | Freq: Once | INTRAVENOUS | Status: AC
  Administered 2020-09-27: 1000 mL via INTRAVENOUS

## 2020-09-27 MED ORDER — LACTATED RINGERS IV SOLN: INTRAVENOUS | Status: DC

## 2020-09-27 MED ORDER — POTASSIUM CHLORIDE 20 MEQ PO PACK
40.0000 meq | PACK | Freq: Two times a day (BID) | ORAL | Status: AC
  Administered 2020-09-27 (×2): 40 meq via ORAL
  Filled 2020-09-27 (×2): qty 2

## 2020-09-27 MED ORDER — ADENOSINE 6 MG/2ML IV SOLN: 12.0000 mg | Freq: Once | INTRAVENOUS | Status: AC

## 2020-09-27 MED ORDER — HEPARIN SODIUM (PORCINE) 5000 UNIT/ML IJ SOLN
5000.0000 [IU] | Freq: Three times a day (TID) | INTRAMUSCULAR | Status: DC
  Administered 2020-09-27 – 2020-10-02 (×14): 5000 [IU] via SUBCUTANEOUS
  Filled 2020-09-27 (×14): qty 1

## 2020-09-27 MED ORDER — ADENOSINE 6 MG/2ML IV SOLN: 6.0000 mg | Freq: Once | INTRAVENOUS | Status: AC

## 2020-09-27 NOTE — Progress Notes (Signed)
New patient from ED admitted to [facility] 2W21.

## 2020-09-27 NOTE — TOC Initial Note (Signed)
Transition of Care [facility]) - Initial/Assessment Note    Patient Details  Name: [name] MRN: [id number] Date of Birth: 1965/11/07  Transition of Care [facility]) CM/SW Contact:    Angelita Ingles, RN Phone Number: [phone]  09/27/2020, 1:48 PM  Clinical Narrative:                 [facility] consulted for CAGE aid and resources for Alcohol abuse. Patient states he is from home where he functions independently. Patient has no DME or needs prior to admission. CAGE aid complete and list of resources provided for the patient. Patient has no other needs at this time. TOC will continue to follow.   Expected Discharge Plan: Home/Self Care Barriers to Discharge: Continued Medical Work up   Patient Goals and CMS Choice Patient states their goals for this hospitalization and ongoing recovery are:: Just wants to get better   Choice offered to / list presented to : NA  Expected Discharge Plan and Services Expected Discharge Plan: Home/Self Care In-house Referral: NA Discharge Planning Services: CM Consult Post Acute Care Choice: NA Living arrangements for the past 2 months: Single Family Home                 DME Arranged: N/A DME Agency: NA       HH Arranged: NA HH Agency: NA        Prior Living Arrangements/Services Living arrangements for the past 2 months: Single Family Home Lives with:: Self Patient language and need for interpreter reviewed:: Yes Do you feel safe going back to the place where you live?: Yes      Need for Family Participation in Patient Care: Yes (Comment) Care giver support system in place?: Yes (comment) Current home services:  (none) Criminal Activity/Legal Involvement Pertinent to Current Situation/Hospitalization: No - Comment as needed  Activities of Daily Living      Permission Sought/Granted   Permission granted to share information with : No              Emotional Assessment Appearance:: Appears stated age Attitude/Demeanor/Rapport:  Gracious Affect (typically observed): Quiet Orientation: : Oriented to Self,Oriented to Place,Oriented to  Time,Oriented to Situation Alcohol / Substance Use: Alcohol Use Psych Involvement: No (comment)  Admission diagnosis:  Alcohol withdrawal ([place]) [F10.239] Left hip pain [M25.552] Fall, initial encounter [W19.XXXA] Alcohol withdrawal syndrome with complication ([place]) [O11.572] Syncope, unspecified syncope type [R55] Patient Active Problem List   Diagnosis Date Noted  . Alcohol withdrawal ([place]) 09/26/2020  . Vitamin D deficiency 07/06/2020  . Right hip pain 07/02/2020  . Alcohol abuse 02/13/2020  . Thrombocytopenia ([place]) 02/13/2020  . Elevated serum GGT level 11/06/2019  . Paresthesia 05/15/2019  . Pain in both lower extremities 05/15/2019  . Functional incontinence 04/14/2019  . Gait abnormality 04/14/2019  . Leg cramps 04/14/2019  . 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 Anemia 03/14/2018  . Left leg pain 01/21/2017  . Essential hypertension 01/21/2017  . MDD (major depressive disorder) 03/25/2010  . ERECTILE DYSFUNCTION 05/14/2008  . GERD 04/13/2008  . TOBACCO ABUSE 03/11/2008   PCP:  [name], MD Pharmacy:   [facility] [address] [place] [address] Phone: [phone] Fax: [phone]     Social Determinants of Health (SDOH) Interventions    Readmission Risk Interventions No flowsheet data found.
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
## 2020-09-27 NOTE — Progress Notes (Signed)
PT Cancellation Note  Patient Details Name: [name] MRN: [id number] DOB: [birth year]   Cancelled Treatment:    Reason Eval/Treat Not Completed: Medical issues which prohibited therapy  Pt tachycardic with HR at 160 at rest. RN hold for 5-6 hours. PT will follow for PT eval   Arby Barrette, PT Pager [phone]  Rexanne Mano 09/27/2020, 9:53 AM

## 2020-09-27 NOTE — Plan of Care (Signed)

## 2020-09-27 NOTE — Progress Notes (Signed)
OT Cancellation Note  Patient Details Name: [name] MRN: [id number] DOB: [birth year]   Cancelled Treatment:    Reason Eval/Treat Not Completed: Medical issues which prohibited therapy (Pt tachycardic with HR at 160 at rest. RN hold for 5-6 hours. OT to continue to follow for OT eval.)   Jefferey Pica, OTR/L Acute Rehabilitation Services Pager: [phone] Office: [phone]   [name] C 09/27/2020, 9:10 AM

## 2020-09-27 NOTE — Hospital Course (Addendum)
Follow up: Repeat CMP to make sure transaminase returns to normal Consider starting naltrexone if a candidate  Holter monitor and outpatient cardiology  Possible GI for cirrhosis/varices  Will need statin therapy at discharge or follow up    Home meds prior to admission: Depakote - 250 mg BID Lisinopril - 10 mg Duloxetine 60 mg  Flexiril 5 mg (unknown if taking) Gabapentin 300 mg (unknown if taking)

## 2020-09-27 NOTE — Evaluation (Signed)
Physical Therapy Evaluation Patient Details Name: [name] MRN: [id number] DOB: May 14, 1966 Today's Date: 09/27/2020   History of Present Illness  55 year old male admitted 09/26/20 with PMHx of alcohol use disorder,  hypertension, depression  presenting with altered mental status. He notes that the day prior he fell into the bathtub but is unable to recall the events surrounding this.  Clinical Impression   Pt admitted with above diagnosis. Patient incredibly impulsive and with h/o ataxic gait with >10 falls per month (per patient). His evaluation was somewhat limited by his incontinence of loose stool. He had multiple near falls as trying to get to the bathroom and as exiting the bathroom and trying to bath in standing at sink.  Pt currently with functional limitations due to the deficits listed below (see PT Problem List). Pt may benefit from skilled PT to increase their independence and safety with mobility to allow discharge to the venue listed below.  Patient does not have 24 hour care at home, which would be ideal. However even with 24/7 care his impulsivity would make it difficult to keep him safe.      Follow Up Recommendations Home health PT    Equipment Recommendations  None recommended by PT    Recommendations for Other Services OT consult     Precautions / Restrictions Precautions Precautions: Fall Precaution Comments: falls more than 10x per month      Mobility  Bed Mobility Overal bed mobility: Needs Assistance Bed Mobility: Supine to Sit     Supine to sit: Supervision     General bed mobility comments: supervision for multiple lines    Transfers Overall transfer level: Needs assistance Equipment used: 1 person hand held assist;None Transfers: Sit to/from Stand Sit to Stand: Min assist         General transfer comment: posterior imbalance multiple times during transfers  Ambulation/Gait Ambulation/Gait assistance: Min assist;Mod assist Gait  Distance (Feet): 15 Feet (x2) Assistive device: IV Pole Gait Pattern/deviations: Step-through pattern;Wide base of support;Staggering right;Staggering left;Leaning posteriorly     General Gait Details: pt weaving all over the place with imbalance and impulsivity related to incontinence of bowels; even after finished in bathroom, he impulsively walks with staggering gait and near falls  Stairs            Wheelchair Mobility    Modified Rankin (Stroke Patients Only)       Balance Overall balance assessment: Needs assistance Sitting-balance support: No upper extremity supported;Feet supported Sitting balance-Leahy Scale: Good     Standing balance support: Single extremity supported Standing balance-Leahy Scale: Poor                               Pertinent Vitals/Pain Pain Assessment: No/denies pain    Home Living Family/patient expects to be discharged to:: Private residence Living Arrangements: Spouse/significant other;Children Available Help at Discharge: Family;Available PRN/intermittently Type of Home: House Home Access: Stairs to enter Entrance Stairs-Rails: Can reach both Entrance Stairs-Number of Steps: 5 Home Layout: One level Home Equipment: [place] - 4 wheels;Cane - single point Additional Comments: did not inquire about any bathroom equipment    Prior Function Level of Independence: Needs assistance   Gait / Transfers Assistance Needed: walks with/without cane inside home and with cane outside; most falls occur outside due to uneven terrain; discussed leaving outdoor chores to other family members (pt tries to roll garbage cans out)  Hand Dominance   Dominant Hand: Right    Extremity/Trunk Assessment   Upper Extremity Assessment Upper Extremity Assessment: Defer to OT evaluation    Lower Extremity Assessment Lower Extremity Assessment: RLE deficits/detail;LLE deficits/detail RLE Sensation: history of peripheral  neuropathy RLE Coordination: decreased gross motor;decreased fine motor LLE Sensation: history of peripheral neuropathy LLE Coordination: decreased fine motor;decreased gross motor       Communication   Communication: No difficulties  Cognition Arousal/Alertness: Awake/alert Behavior During Therapy: Impulsive Overall Cognitive Status: No family/caregiver present to determine baseline cognitive functioning                                 General Comments: very impulsive pre-going to bathroom and post-; during cleaning up from incontinent/diarrhea he frequently did not follow instructions on staying seated to do his lower legs and feet.      General Comments General comments (skin integrity, edema, etc.): HR at rest 97; max HR 126 bpm    Exercises     Assessment/Plan    PT Assessment Patient needs continued PT services  PT Problem List Decreased balance;Decreased mobility;Decreased coordination;Decreased cognition;Decreased knowledge of use of DME;Decreased safety awareness;Decreased knowledge of precautions;Cardiopulmonary status limiting activity;Impaired sensation       PT Treatment Interventions DME instruction;Gait training;Stair training;Functional mobility training;Therapeutic activities;Therapeutic exercise;Balance training;Neuromuscular re-education;Cognitive remediation;Patient/family education    PT Goals (Current goals can be found in the Care Plan section)  Acute Rehab PT Goals Patient Stated Goal: agrees he wants to reduce his falls PT Goal Formulation: With patient Time For Goal Achievement: 10/11/20 Potential to Achieve Goals: Fair    Frequency Min 3X/week   Barriers to discharge Decreased caregiver support pt home alone days    Co-evaluation               AM-PAC PT "6 Clicks" Mobility  Outcome Measure Help needed turning from your back to your side while in a flat bed without using bedrails?: None Help needed moving from lying on  your back to sitting on the side of a flat bed without using bedrails?: None Help needed moving to and from a bed to a chair (including a wheelchair)?: A Lot Help needed standing up from a chair using your arms (e.g., wheelchair or bedside chair)?: A Lot Help needed to walk in hospital room?: A Lot Help needed climbing 3-5 steps with a railing? : A Lot 6 Click Score: 16    End of Session   Activity Tolerance: Treatment limited secondary to medical complications (Comment) Patient left: in chair;with call bell/phone within reach;with chair alarm set Nurse Communication:  (RN off the unit; NT off the unit) PT Visit Diagnosis: Ataxic gait (R26.0);History of falling (Z91.81)    Time: 0539-7673 PT Time Calculation (min) (ACUTE ONLY): 49 min   Charges:   PT Evaluation $PT Eval Moderate Complexity: 1 Mod PT Treatments $Therapeutic Activity: 8-22 mins         Arby Barrette, PT Pager [phone]   Rexanne Mano 09/27/2020, 3:05 PM

## 2020-09-27 NOTE — Progress Notes (Signed)
HD#0 Subjective:  Overnight Events: Patient admitted overnight  [name] is resting in bed comfortably.  He states he feels okay today.  States lasting he remembers was walking to the bathroom and the next thing he knows he had fallen into the bathtub.  His last drink was 2 to 3 days ago.  He also admitted that Friday night/Saturday morning he used cocaine.  He notes that he has not felt well since Saturday.  Overall though denies any chest pain, palpitations, shortness of breath, abdominal pain.  Denies ever going through withdrawals before.  Denies shakes at this time.  Objective:  Vital signs in last 24 hours: Vitals:   09/27/20 0851 09/27/20 0853 09/27/20 0900 09/27/20 1530  BP: (!) 135/98  130/85 120/87  Pulse: (!) 104 100 97 93  Resp:  (!) 21 20 (!) 21  Temp:      TempSrc:      SpO2: 99% 100% 99% 100%  Weight:       Supplemental O2: Room Air SpO2: 100 %   Physical Exam:  Constitutional: Awake, alert, no acute distress HENT: normocephalic atraumatic Eyes: conjunctiva non-erythematous Neck: supple Cardiovascular: regular rate and rhythm, no m/r/g Pulmonary/Chest: normal work of breathing on room air Abdominal: soft, non-tender, active bowel sounds MSK: normal bulk and tone Neurological: alert & oriented x 3.  No tremors. Skin: warm and dry Psych: Normal mood  Filed Weights   09/27/20 0229  Weight: 64.1 kg    No intake or output data in the 24 hours ending 09/27/20 1738 Net IO Since Admission: No IO data has been entered for this period [09/27/20 1738]  Pertinent Labs: CBC Latest Ref Rng & Units 09/27/2020 09/26/2020 05/13/2020  WBC 4.0 - 10.5 K/uL 8.0 4.5 3.5(L)  Hemoglobin 13.0 - 17.0 g/dL 13.3 13.7 15.1  Hematocrit 39.0 - 52.0 % 39.1 40.6 45.8  Platelets 150 - 400 K/uL 56(L) 54(L) 87(L)    CMP Latest Ref Rng & Units 09/27/2020 09/27/2020 09/26/2020  Glucose 70 - 99 mg/dL 99 166(H) 152(H)  BUN 6 - 20 mg/dL <5(L) <5(L) 5(L)  Creatinine 0.61 - 1.24 mg/dL  0.72 0.83 0.73  Sodium 135 - 145 mmol/L 137 136 135  Potassium 3.5 - 5.1 mmol/L 3.3(L) 3.3(L) 3.7  Chloride 98 - 111 mmol/L 103 101 99  CO2 22 - 32 mmol/L 25 26 26   Calcium 8.9 - 10.3 mg/dL 9.2 9.3 9.1  Total Protein 6.5 - 8.1 g/dL - 7.1 7.1  Total Bilirubin 0.3 - 1.2 mg/dL - 1.9(H) 2.0(H)  Alkaline Phos 38 - 126 U/L - 122 130(H)  AST 15 - 41 U/L - 126(H) 150(H)  ALT 0 - 44 U/L - 54(H) 59(H)    Imaging: MR BRAIN WO CONTRAST  Result Date: 09/26/2020 CLINICAL DATA:  Altered mental status, history of alcohol abuse EXAM: MRI HEAD WITHOUT CONTRAST TECHNIQUE: Multiplanar, multiecho pulse sequences of the brain and surrounding structures were obtained without intravenous contrast. COMPARISON:  None. FINDINGS: Motion degraded DWI, sagittal T1, and axial T2 sequences obtained. Patient could not tolerate remainder of the study. No definite reduced diffusion. No mass effect. Normal marrow signal is preserved. Major vessel flow voids at the skull base are preserved. 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 Chronic bilateral thalamic infarcts. IMPRESSION: Motion degraded acquired sequences. Patient could not tolerate remainder of the study. No acute infarction identified. Electronically Signed   By: Macy Mis M.D.   On: 09/26/2020 18:59    Assessment/Plan:  Active Problems:   Alcohol withdrawal ([place])   Syncope   Patient Summary: [name] is a 55 y.o. with a pertinent PMH of alcohol use disorder,  hypertension, depression  presenting with altered mental status who presented with post fall and syncopal episode and admitted for further evaluation of syncopal episode and monitoring for alcohol withdrawal.    Acute alcohol withdrawal:  Patient has no tremors and is oriented x3.  He has no overt signs of alcohol withdrawal at this time.  -We will continue with CIWA protocol with Ativan  -Continue with Librium 25 mg 3 times daily  -We will continue multivitamin  thiamine for now  -[facility] consulted for alcohol cessation.  Follow-up appreciated  -Patient also noted to have mild acute alcoholic hepatitis with AST of 150 and ALT of 59.  His LFTs are mildly improved today.  We will continue to monitor closely.  -Daily CMP   SVT:  This morning patient's heart rate found to be in the 150s with a narrow complex tachycardia.  Difficult to differentiate if P waves present, sinus tachycardia versus SVT.  Patient given 6 mg adenosine converted to normal sinus rhythm.  IMTS as well as rapid response team at bedside.  Patient tolerated this procedure well.  Rates improved to 90s to 100s.  Suspect etiology secondary to alcohol withdrawals versus cocaine use.  This may be why patient had syncopal episode prior to arrival. -Continue to monitor on telemetry -Patient will need Holter monitor at discharge.  Hypokalemia Patient with low potassium of 3.3, repeat BMP this afternoon showed potassium of 3.3. -Continue 40 mEq twice daily -Give IV 10 mEq every hour for 4 hours of potassium chloride  Anemia Thrombocytopenia   Hemoglobin of 13.3 with MCV of one 1.6.  Vitamin B12 of 1300, folate 27.6.  Will order reticulocyte count as well as iron studies and ferritin.  Plates of 56.  Patient continues to be thrombocytopenic.  Suspect secondary to liver disease but will continue work-up.  PTT, PT/INR normal. -Reticulocyte count, ferritin, iron studies pending -Smear pending  Subacute/remote thalamic infarct Lipid panel with LDL 125, total cholesterol 187.  Hemoglobin A1c of 5.3. Patient will need to be started on high intensity statin. -Plan to start atorvastatin at discharge  Diet: Heart Healthy IVF: LR,100cc/hr VTE: Heparin consult per pharmacy Code: Full PT/OT recs: Pending  Dispo: Anticipated discharge to Home in 1 to 2 days pending further work-up and evaluation.   Sanjuana Letters DO Internal Medicine Resident PGY-1 Pager [phone] Please contact the on call  pager after 5 pm and on weekends at [phone].

## 2020-09-28 ENCOUNTER — Inpatient Hospital Stay (HOSPITAL_COMMUNITY): Payer: Medicaid Other

## 2020-09-28 DIAGNOSIS — Z7289 Other problems related to lifestyle: Secondary | ICD-10-CM

## 2020-09-28 DIAGNOSIS — W19XXXA Unspecified fall, initial encounter: Secondary | ICD-10-CM

## 2020-09-28 DIAGNOSIS — R945 Abnormal results of liver function studies: Secondary | ICD-10-CM

## 2020-09-28 DIAGNOSIS — D638 Anemia in other chronic diseases classified elsewhere: Secondary | ICD-10-CM

## 2020-09-28 DIAGNOSIS — I6389 Other cerebral infarction: Secondary | ICD-10-CM

## 2020-09-28 LAB — COMPREHENSIVE METABOLIC PANEL
ALT: 46 U/L — ABNORMAL HIGH (ref 0–44)
AST: 97 U/L — ABNORMAL HIGH (ref 15–41)
Albumin: 2.9 g/dL — ABNORMAL LOW (ref 3.5–5.0)
Alkaline Phosphatase: 118 U/L (ref 38–126)
Anion gap: 6 (ref 5–15)
BUN: <5 mg/dL — ABNORMAL LOW (ref 6–20)
CO2: 26 mmol/L (ref 22–32)
Calcium: 8.8 mg/dL — ABNORMAL LOW (ref 8.9–10.3)
Chloride: 102 mmol/L (ref 98–111)
Creatinine, Ser: 0.59 mg/dL — ABNORMAL LOW (ref 0.61–1.24)
GFR, Estimated: >60 mL/min (ref >60)
Glucose, Bld: 93 mg/dL (ref 70–99)
Potassium: 5 mmol/L (ref 3.5–5.1)
Sodium: 134 mmol/L — ABNORMAL LOW (ref 135–145)
Total Bilirubin: 1.8 mg/dL — ABNORMAL HIGH (ref 0.3–1.2)
Total Protein: 6.2 g/dL — ABNORMAL LOW (ref 6.5–8.1)

## 2020-09-28 LAB — CBC
HCT: 36.5 % — ABNORMAL LOW (ref 39.0–52.0)
Hemoglobin: 12.5 g/dL — ABNORMAL LOW (ref 13.0–17.0)
MCH: 34.9 pg — ABNORMAL HIGH (ref 26.0–34.0)
MCHC: 34.2 g/dL (ref 30.0–36.0)
MCV: 102 fL — ABNORMAL HIGH (ref 80.0–100.0)
Platelets: 61 10*3/uL — ABNORMAL LOW (ref 150–400)
RBC: 3.58 MIL/uL — ABNORMAL LOW (ref 4.22–5.81)
RDW: 13.3 % (ref 11.5–15.5)
WBC: 7.1 10*3/uL (ref 4.0–10.5)
nRBC: 0 % (ref 0.0–0.2)

## 2020-09-28 LAB — PROTIME-INR
INR: 1.1 (ref 0.8–1.2)
Prothrombin Time: 14.2 seconds (ref 11.4–15.2)

## 2020-09-28 LAB — GLUCOSE, CAPILLARY: Glucose-Capillary: 104 mg/dL — ABNORMAL HIGH (ref 70–99)

## 2020-09-28 MED ORDER — CHLORDIAZEPOXIDE HCL 25 MG PO CAPS
25.0000 mg | ORAL_CAPSULE | Freq: Two times a day (BID) | ORAL | Status: DC
Start: 2020-09-28 — End: 2020-09-29
  Administered 2020-09-28: 25 mg via ORAL
  Filled 2020-09-28: qty 1

## 2020-09-28 MED ORDER — CHLORDIAZEPOXIDE HCL 25 MG PO CAPS
25.0000 mg | ORAL_CAPSULE | Freq: Three times a day (TID) | ORAL | Status: DC
  Administered 2020-09-28: 25 mg via ORAL
  Filled 2020-09-28: qty 1

## 2020-09-28 MED ORDER — LORAZEPAM 2 MG/ML IJ SOLN
0.0000 mg | INTRAMUSCULAR | Status: DC
  Administered 2020-09-28 – 2020-09-29 (×3): 2 mg via INTRAVENOUS
  Filled 2020-09-28 (×4): qty 1

## 2020-09-28 MED ORDER — LORAZEPAM 1 MG PO TABS: 0.0000 mg | ORAL_TABLET | ORAL | Status: DC

## 2020-09-28 NOTE — Progress Notes (Signed)
HD#1 Subjective:  Overnight Events: Patient admitted overnight  [name] denies any palpitations or tremors overnight. He was told his heart was racing again around 8pm but he states he was relaxing watching TV at the time. He has an appetite, eating breakfast. He denies any easy bruising. He has been urinating well.   Objective:  Vital signs in last 24 hours: Vitals:   09/27/20 0900 09/27/20 1530 09/27/20 2000 09/28/20 0000  BP: 130/85 120/87 131/87 125/88  Pulse: 97 93 93 91  Resp: 20 (!) 21  (!) 21  Temp:      TempSrc:      SpO2: 99% 100%  100%  Weight:       Supplemental O2: Room Air SpO2: 100 %   Physical Exam:  Constitutional: Awake, alert, no acute distress HENT: normocephalic atraumatic Eyes: conjunctiva non-erythematous Neck: supple Cardiovascular: regular rate and rhythm, no m/r/g Pulmonary/Chest: normal work of breathing on room air Abdominal: soft, non-tender, active bowel sounds MSK: normal bulk and tone Neurological: alert & oriented x 3.  No tremors. Skin: warm and dry Psych: Normal mood  Filed Weights   09/27/20 0229  Weight: 64.1 kg     Intake/Output Summary (Last 24 hours) at 09/28/2020 0701 Last data filed at 09/28/2020 0600 Gross per 24 hour  Intake 987.71 ml  Output --  Net 987.71 ml   Net IO Since Admission: 987.71 mL [09/28/20 0701]  Pertinent Labs: CBC Latest Ref Rng & Units 09/28/2020 09/27/2020 09/26/2020  WBC 4.0 - 10.5 K/uL 7.1 8.0 4.5  Hemoglobin 13.0 - 17.0 g/dL 12.5(L) 13.3 13.7  Hematocrit 39.0 - 52.0 % 36.5(L) 39.1 40.6  Platelets 150 - 400 K/uL 61(L) 56(L) 54(L)    CMP Latest Ref Rng & Units 09/28/2020 09/27/2020 09/27/2020  Glucose 70 - 99 mg/dL 93 99 166(H)  BUN 6 - 20 mg/dL <5(L) <5(L) <5(L)  Creatinine 0.61 - 1.24 mg/dL 0.59(L) 0.72 0.83  Sodium 135 - 145 mmol/L 134(L) 137 136  Potassium 3.5 - 5.1 mmol/L 5.0 3.3(L) 3.3(L)  Chloride 98 - 111 mmol/L 102 103 101  CO2 22 - 32 mmol/L 26 25 26   Calcium 8.9 - 10.3 mg/dL  8.8(L) 9.2 9.3  Total Protein 6.5 - 8.1 g/dL 6.2(L) - 7.1  Total Bilirubin 0.3 - 1.2 mg/dL 1.8(H) - 1.9(H)  Alkaline Phos 38 - 126 U/L 118 - 122  AST 15 - 41 U/L 97(H) - 126(H)  ALT 0 - 44 U/L 46(H) - 54(H)    Imaging: No results found.  Assessment/Plan:   Active Problems:   Alcohol withdrawal ([place])   Syncope   Patient Summary: [name] is a 55 y.o. with a pertinent PMH of alcohol use disorder,  hypertension, depression  presenting with altered mental status who presented with post fall and syncopal episode and admitted for further evaluation of syncopal episode and monitoring for alcohol withdrawal.    Acute alcohol withdrawal:  Patient scored 6 on CIWA overnight.  States he feels well overall, without tremors night sweats, vomiting or nausea.  Patient is eating well.  We will taper off of Librium.  Discussed with patient alcohol cessation, he states he will think about it. -Continue with Librium 25 mg BID  -We will continue multivitamin thiamine for now  -[facility] consulted for alcohol cessation.  Follow-up appreciated  -Daily CMP   Sinus tachycardia Patient's heart rate 90-100s.  Overall doing well, no other episodes of arrhythmia since administration of adenosine yesterday.  Patient denies chest pain, shortness  of breath, palpitations. -Continue to monitor on telemetry -Patient will need Holter monitor at discharge.  Elevated liver enzymes Patient also noted to have mild acute alcoholic hepatitis with AST of 150 and ALT of 59.  His LFTs are mildly improved today.  We will continue to monitor closely.   Madrey's discrimination function, 7.8 points.  Good prognosis overall. -Right upper quadrant ultrasound pending  Hypokalemia Resolved to K of 5 -Daily monitoring  Anemia Thrombocytopenia   Hemoglobin of 12.5, MCV 102.  B12 elevated, will continue folate treatment.  Ferritin quite elevated at 1356.  Absolute reticulocyte count of 1.7, evidence of hypoproliferation.   Suspect anemia of chronic disease in setting of alcohol use.  We will continue to monitor and trend. -Daily CBC  Subacute/remote thalamic infarct Lipid panel with LDL 125, total cholesterol 187.  Hemoglobin A1c of 5.3. Patient will need to be started on high intensity statin. -Plan to start atorvastatin at discharge  Diet: Heart Healthy IVF: LR,100cc/hr VTE: Heparin consult per pharmacy Code: Full PT/OT recs: Pending  Dispo: Anticipated discharge to Home in 1 to 2 days pending further work-up and evaluation.   Sanjuana Letters DO Internal Medicine Resident PGY-1 Pager [phone] Please contact the on call pager after 5 pm and on weekends at [phone].

## 2020-09-28 NOTE — Progress Notes (Signed)
Evening Update:  Paged by RN regarding patient being found sitting on the floor next to medication cart. Nursing assessment negative and patient denied any pain at the time. Patient assessed at bedside. He is resting comfortably in bed with ambient lighting in room. He is somnolent but easily arousable to voice. Patient reports that he was eating earlier and got up to go to the sink to wash his hands. On returning back from the sink, his left leg gave out on him. He reports landing on his lower back and is endorsing some lower back pain; however, denies head trauma. He denies any lightheadedness/dizziness, chest pain, shortness of breath, or palpitations prior to falling. No current symptoms except for lower back pain.   Physical Exam:  Blood pressure (!) 147/103, pulse (!) 104, temperature 98.7 F (37.1 C), temperature source Oral, resp. rate 20, weight 64.1 kg, SpO2 96 % on RA Constitutional: Somnolent, Appears well-developed and well-nourished. No distress.  HENT: Normocephalic and atraumatic, conjunctiva normal, moist mucous membranes Cardiovascular: Normal rate, regular rhythm, S1 and S2 present, no murmurs, rubs, gallops.  Distal pulses intact Respiratory: No respiratory distress, Effort is normal.  Lungs are clear to auscultation bilaterally. GI: Nondistended, soft, nontender to palpation, active bowel sounds Musculoskeletal: Normal bulk and tone.  No peripheral edema noted. Neurological: Somnolent on exam but arousable to voice; Alert and oriented to self, location and situation. Reports year as 2023, and date as March 30.  Speech is significantly slurred on exam. Pupils sluggishly reactive to light. Extraocular eye movements grossly intact although unable to completely assess and patient is intermittently following commands and unable to keep eyes open. Notes decreased sensation to light touch on left side of face (unable to specify if this is change from his baseline). Remainder of cranial  nerve exam wnl. Strength 5/5 in bilateral upper extremities; 4/5 in RLE and 1/5 in LLE. No tremors noted on exam.  Skin: Warm and dry.  No rash, erythema, lesions noted.   Assessment/Plan: Patient with acute change in status today. He is somnolent on examination, barely able to keep his eyes open on exam and with significantly slurred speech. Also noted to have significant LLE weakness, although he notes that he generally has LLE weakness but is unable to quantify if this is worse. He has been on CIWA w/ Ativan although last dose of Ativan was at 18:22. He does not appear to be in acute withdrawal at this time.  - Given his thrombocytopenia and high risk for bleed, will obtain CT Head wo Contrast  - Continue CIWA w/Ativan at this time  - Continue to monitor

## 2020-09-28 NOTE — Plan of Care (Signed)

## 2020-09-28 NOTE — Progress Notes (Signed)
This nurse witnessed pt fall. Bed alarm was ringing, yellow nonskid socks are on, and 3/4 bed rails were up. Upon entering room pt was up ambulating by the computer. I asked pt to be still and stop while I applied my gloves. Pt is covered in feces/urine. Before I could fully enter room pt stumbled and fell on his bottom. I then briefly assessed him and helped him up to the bed. By the time I finished getting him back to bed his nurse Tonette Bihari was at the room. Nurse mananger, [facility], and on call doctor are aware.

## 2020-09-28 NOTE — Progress Notes (Signed)
OT Cancellation Note  Patient Details Name: [name] MRN: [id number] DOB: Jun 15, 1966   Cancelled Treatment:    Reason Eval/Treat Not Completed: Patient at procedure or test/ unavailable ([place] of abdomen. OT to continue to follow.)   Jefferey Pica, OTR/L Acute Rehabilitation Services Pager: [phone] Office: [place] [facility] 09/28/2020, 3:39 PM

## 2020-09-28 NOTE — Progress Notes (Signed)
Paged by RN that patient was wanting to leave AMA.  Went to bedside, patient's significant in room as well.  Patient states he would like to go home, he is uncomfortable in the hospital bed does not like him and use a bedpan.  I discussed with him that it would be quite dangerous for him to leave the hospital today.  We discussed that with his alcohol use, he is at high risk for active withdrawals.  This may lead to seizures as well as death.  Patient does have a history of seizure disorder.  We also discussed that with him low platelets and his difficulty ambulating per OT today, patient follow-up with he could have massive bleed.  Patient acknowledges understanding of these risks, I discussed with him that I do believe he will need to be admitted for another day or so but that we will have to take it 1 day at a time.  He is also at risk for going back into an arrhythmia after having a CT day of his admission.  Patient is agreeable to staying at this time.

## 2020-09-28 NOTE — Progress Notes (Incomplete)
2110 hrs: Came to pt's room. RN in room and states pt found sitting on floor next to medication cart. Pt cleaned up and returned to bed. Assessment negative, VSS. Pt denies pain or other symptoms at this time. Pt still wearing non-skid socks, bed alarm on, bed in low position, ambient light in room on. Msg sent to resident service, Resident returned call and states that she will be up to check pt in a few moments. Pt not due for ativan yet. CIWA 11-13 at this time. Pt confused to his whereabouts. He is unsure why he was out of bed. CN aware of situation. Will monitor pt closely.   2120 hrs: Resident in room evaluating pt.

## 2020-09-29 ENCOUNTER — Encounter: Payer: Medicaid Other | Admitting: Student

## 2020-09-29 DIAGNOSIS — R932 Abnormal findings on diagnostic imaging of liver and biliary tract: Secondary | ICD-10-CM

## 2020-09-29 DIAGNOSIS — R400 Somnolence: Secondary | ICD-10-CM

## 2020-09-29 LAB — CBC
HCT: 38.3 % — ABNORMAL LOW (ref 39.0–52.0)
Hemoglobin: 12.7 g/dL — ABNORMAL LOW (ref 13.0–17.0)
MCH: 34.6 pg — ABNORMAL HIGH (ref 26.0–34.0)
MCHC: 33.2 g/dL (ref 30.0–36.0)
MCV: 104.4 fL — ABNORMAL HIGH (ref 80.0–100.0)
Platelets: 67 10*3/uL — ABNORMAL LOW (ref 150–400)
RBC: 3.67 MIL/uL — ABNORMAL LOW (ref 4.22–5.81)
RDW: 13.4 % (ref 11.5–15.5)
WBC: 7.6 10*3/uL (ref 4.0–10.5)
nRBC: 0 % (ref 0.0–0.2)

## 2020-09-29 LAB — COMPREHENSIVE METABOLIC PANEL
ALT: 39 U/L (ref 0–44)
AST: 70 U/L — ABNORMAL HIGH (ref 15–41)
Albumin: 3 g/dL — ABNORMAL LOW (ref 3.5–5.0)
Alkaline Phosphatase: 118 U/L (ref 38–126)
Anion gap: 8 (ref 5–15)
BUN: <5 mg/dL — ABNORMAL LOW (ref 6–20)
CO2: 26 mmol/L (ref 22–32)
Calcium: 9.1 mg/dL (ref 8.9–10.3)
Chloride: 99 mmol/L (ref 98–111)
Creatinine, Ser: 0.61 mg/dL (ref 0.61–1.24)
GFR, Estimated: >60 mL/min (ref >60)
Glucose, Bld: 101 mg/dL — ABNORMAL HIGH (ref 70–99)
Potassium: 3.7 mmol/L (ref 3.5–5.1)
Sodium: 133 mmol/L — ABNORMAL LOW (ref 135–145)
Total Bilirubin: 1.5 mg/dL — ABNORMAL HIGH (ref 0.3–1.2)
Total Protein: 6.4 g/dL — ABNORMAL LOW (ref 6.5–8.1)

## 2020-09-29 LAB — GLUCOSE, CAPILLARY: Glucose-Capillary: 104 mg/dL — ABNORMAL HIGH (ref 70–99)

## 2020-09-29 LAB — MAGNESIUM: Magnesium: 1.9 mg/dL (ref 1.7–2.4)

## 2020-09-29 LAB — PHOSPHORUS: Phosphorus: 4.1 mg/dL (ref 2.5–4.6)

## 2020-09-29 MED ORDER — CHLORDIAZEPOXIDE HCL 25 MG PO CAPS
25.0000 mg | ORAL_CAPSULE | Freq: Every day | ORAL | Status: DC
  Administered 2020-09-29 – 2020-10-01 (×3): 25 mg via ORAL
  Filled 2020-09-29 (×3): qty 1

## 2020-09-29 MED ORDER — LORAZEPAM 2 MG/ML IJ SOLN
1.0000 mg | INTRAMUSCULAR | Status: DC | PRN
  Administered 2020-09-29: 3 mg via INTRAVENOUS
  Administered 2020-09-29: 2 mg via INTRAVENOUS
  Administered 2020-09-29: 3 mg via INTRAVENOUS
  Administered 2020-09-29 (×2): 2 mg via INTRAVENOUS
  Administered 2020-09-30: 3 mg via INTRAVENOUS
  Filled 2020-09-29: qty 2
  Filled 2020-09-29: qty 1
  Filled 2020-09-29 (×2): qty 2
  Filled 2020-09-29: qty 1

## 2020-09-29 MED ORDER — LORAZEPAM 1 MG PO TABS: 1.0000 mg | ORAL_TABLET | ORAL | Status: DC | PRN

## 2020-09-29 MED ORDER — POTASSIUM CHLORIDE 20 MEQ PO PACK
20.0000 meq | PACK | Freq: Two times a day (BID) | ORAL | Status: DC
  Administered 2020-09-29 – 2020-10-02 (×6): 20 meq via ORAL
  Filled 2020-09-29 (×6): qty 1

## 2020-09-29 NOTE — Evaluation (Signed)
Occupational Therapy Evaluation Patient Details Name: [name] MRN: [id number] DOB: 1966-02-02 Today's Date: 09/29/2020    History of Present Illness 55 year old male admitted 09/26/20 with PMHx of alcohol use disorder,  hypertension, depression  presenting with altered mental status. He notes that the day prior he fell into the bathtub but is unable to recall the events surrounding this. 3/29 fell in hospital (up walking by himself in room); 3/29 head CT negative for acute changes   Clinical Impression   Pt PTA: Pt living with significant other and was nearly independent, but multiple falls per month. Pt currently, limited by decreased cognition, decreased ability to care for self, decreased activity tolerance. Pt perseveration looking for phone and wanting a cigarrete. Pt following commands with cues today. Pt unable to state that he was starting to get fatigued. Pt set-upA to totalA for ADL and minA to modA +2 for mobility as pt with posterior lean and R/L bias at times. Pt would benefit from continued OT skilled services. OT following acutely.     Follow Up Recommendations  Home health OT    Equipment Recommendations  None recommended by OT    Recommendations for Other Services       Precautions / Restrictions Precautions Precautions: Fall Precaution Comments: falls more than 10x per month Restrictions Weight Bearing Restrictions: No      Mobility Bed Mobility Overal bed mobility: Needs Assistance Bed Mobility: Supine to Sit     Supine to sit: Supervision     General bed mobility comments: supervision for multiple lines    Transfers Overall transfer level: Needs assistance Equipment used: 2 person hand held assist Transfers: Sit to/from Stand Sit to Stand: Min assist;+2 safety/equipment         General transfer comment: posterior imbalance multiple times and R sided lean during transfers    Balance Overall balance assessment: Needs  assistance Sitting-balance support: No upper extremity supported;Feet supported Sitting balance-Leahy Scale: Fair     Standing balance support: Single extremity supported Standing balance-Leahy Scale: Poor                             ADL either performed or assessed with clinical judgement   ADL Overall ADL's : Needs assistance/impaired Eating/Feeding: Set up;Sitting   Grooming: Minimal assistance;Standing Grooming Details (indicate cue type and reason): minA for stability and opening toothpaste Upper Body Bathing: Minimal assistance;Sitting   Lower Body Bathing: Moderate assistance;Cueing for safety;Sit to/from stand   Upper Body Dressing : Minimal assistance;Sitting   Lower Body Dressing: Moderate assistance;Cueing for safety;Sitting/lateral leans;Sit to/from stand   Toilet Transfer: Minimal assistance;+2 for physical assistance;+2 for safety/equipment;Ambulation Toilet Transfer Details (indicate cue type and reason): hand held assist Toileting- Clothing Manipulation and Hygiene: Total assistance Toileting - Clothing Manipulation Details (indicate cue type and reason): not cognitively aware of continence     Functional mobility during ADLs: Minimal assistance;+2 for physical assistance;+2 for safety/equipment;Moderate assistance;Cueing for safety;Cueing for sequencing General ADL Comments: Pt limited by decreased cognition, decreased ability to care for self, decreased activity tolerance. Pt perseveration looking for phone and wanting a cigarrette. Pt following commands with cues today. Pt unable to state that he was starting to get fatigued.     Vision Baseline Vision/History: No visual deficits Patient Visual Report: No change from baseline Vision Assessment?: No apparent visual deficits     Perception     Praxis      Pertinent Vitals/Pain Pain Assessment:  Faces Faces Pain Scale: No hurt Pain Intervention(s): Monitored during session     Hand  Dominance Right   Extremity/Trunk Assessment Upper Extremity Assessment Upper Extremity Assessment: Generalized weakness   Lower Extremity Assessment Lower Extremity Assessment: Generalized weakness RLE Sensation: history of peripheral neuropathy RLE Coordination: decreased gross motor;decreased fine motor LLE Sensation: history of peripheral neuropathy LLE Coordination: decreased fine motor;decreased gross motor   Cervical / Trunk Assessment Cervical / Trunk Assessment: Normal   Communication Communication Communication: No difficulties   Cognition Arousal/Alertness: Lethargic;Suspect due to medications Behavior During Therapy: Impulsive (less due to lethargy) Overall Cognitive Status: No family/caregiver present to determine baseline cognitive functioning                                 General Comments: had ativan due to withdrawal; asking for cigarette repeatedly   General Comments       Exercises     Shoulder Instructions      Home Living Family/patient expects to be discharged to:: Private residence Living Arrangements: Spouse/significant other;Children Available Help at Discharge: Family;Available PRN/intermittently Type of Home: House Home Access: Stairs to enter [employer] of Steps: 5 Entrance Stairs-Rails: Can reach both Home Layout: One level               Home Equipment: Pung - 4 wheels;Cane - single point   Additional Comments: did not inquire about any bathroom equipment      Prior Functioning/Environment Level of Independence: Needs assistance  Gait / Transfers Assistance Needed: walks with/without cane inside home and with cane outside; most falls occur outside due to uneven terrain; discussed leaving outdoor chores to other family members (pt tries to roll garbage cans out) ADL's / [place] Needed: per chart, pt was independent, but his hobby was drinking.            OT Problem List: Decreased  activity tolerance;Impaired balance (sitting and/or standing)      OT Treatment/Interventions: Self-care/ADL training;Therapeutic exercise;Energy conservation;DME and/or AE instruction;Therapeutic activities;Cognitive remediation/compensation;Patient/family education;Balance training    OT Goals(Current goals can be found in the care plan section) Acute Rehab OT Goals Patient Stated Goal: agrees he wants to reduce his falls OT Goal Formulation: With patient Time For Goal Achievement: 10/13/20 Potential to Achieve Goals: Good ADL Goals Pt Will Perform Grooming: with supervision;standing Pt Will Perform Lower Body Dressing: with supervision;sit to/from stand;sitting/lateral leans Pt Will Transfer to Toilet: with min guard assist;ambulating Pt Will Perform Toileting - Clothing Manipulation and hygiene: with supervision;sit to/from stand Additional ADL Goal #1: Pt will follow (3) multi-step commands with minimal cues to attend to task in a non distracting environment.  OT Frequency: Min 2X/week   Barriers to D/C:            Co-evaluation PT/OT/SLP Co-Evaluation/Treatment: Yes Reason for Co-Treatment: For patient/therapist safety;To address functional/ADL transfers;Complexity of the patient's impairments (multi-system involvement)   OT goals addressed during session: ADL's and self-care      AM-PAC OT "6 Clicks" Daily Activity     Outcome Measure Help from another person eating meals?: None Help from another person taking care of personal grooming?: A Little Help from another person toileting, which includes using toliet, bedpan, or urinal?: Total Help from another person bathing (including washing, rinsing, drying)?: A Lot Help from another person to put on and taking off regular upper body clothing?: A Little Help from another person to put on and taking off  regular lower body clothing?: A Lot 6 Click Score: 15   End of Session Equipment Utilized During Treatment: Gait  belt Nurse Communication: Mobility status  Activity Tolerance: Patient tolerated treatment well Patient left: in bed;with call bell/phone within reach;with bed alarm set  OT Visit Diagnosis: Muscle weakness (generalized) (M62.81);Other symptoms and signs involving cognitive function                Time: 4327-6147 OT Time Calculation (min): 24 min Charges:  OT General Charges $OT Visit: 1 Visit OT Evaluation $OT Eval Moderate Complexity: 1 Mod  [name], OTR/L Acute Rehabilitation Services Pager: [phone] Office: [phone]   [name] C 09/29/2020, 2:50 PM

## 2020-09-29 NOTE — Progress Notes (Signed)
Performed bladder scan, patient had 103 ml of urine in bladder.  Provider has been notified.  Will continue to monitor.

## 2020-09-29 NOTE — Progress Notes (Signed)
HD#2 Subjective:  Overnight Events: Patient fell out of bed, after standing and walking.  CT negative for acute process  Mr. Hickel resting in bed comfortably, nurse states that patient has been agitated the past 24 hours is required increasing amounts of Ativan.  He is currently lying in bed wearing mitts, in and out of sleep.  Patient denies any pain at this time, denies shortness breath or chest pain.  He has no new concerns this time.  We discussed with him that we will continue to monitor his potential for alcohol withdrawal as well as arrhythmia that was found during his admission.  Objective:  Vital signs in last 24 hours: Vitals:   09/29/20 0300 09/29/20 0339 09/29/20 0400 09/29/20 0500  BP: 129/83 124/81 (!) 143/115 124/82  Pulse: (!) 102 (!) 105 (!) 26 (!) 106  Resp: (!) 21 20 18    Temp:  97.8 F (36.6 C)    TempSrc:  Oral    SpO2: 95% 96% 91%   Weight:       Supplemental O2: Room Air SpO2: 91 %   Physical Exam:  Constitutional: Lethargic HENT: normocephalic atraumatic Neck: supple Cardiovascular: Tachycardic, regular rhythm.  No murmurs gallops rubs appreciated Pulmonary/Chest: normal work of breathing on room air Abdominal: soft, non-tender, active bowel sounds MSK: normal bulk and tone.  1-2+ lower extremity edema Neurological: Somnolent, but easily arousable Skin: warm and dry  Filed Weights   09/27/20 0229  Weight: 64.1 kg    No intake or output data in the 24 hours ending 09/29/20 0620 Net IO Since Admission: 987.71 mL [09/29/20 0620]  Pertinent Labs: CBC Latest Ref Rng & Units 09/29/2020 09/28/2020 09/27/2020  WBC 4.0 - 10.5 K/uL 7.6 7.1 8.0  Hemoglobin 13.0 - 17.0 g/dL 12.7(L) 12.5(L) 13.3  Hematocrit 39.0 - 52.0 % 38.3(L) 36.5(L) 39.1  Platelets 150 - 400 K/uL 67(L) 61(L) 56(L)    CMP Latest Ref Rng & Units 09/29/2020 09/28/2020 09/27/2020  Glucose 70 - 99 mg/dL 101(H) 93 99  BUN 6 - 20 mg/dL <5(L) <5(L) <5(L)  Creatinine 0.61 - 1.24 mg/dL 0.61  0.59(L) 0.72  Sodium 135 - 145 mmol/L 133(L) 134(L) 137  Potassium 3.5 - 5.1 mmol/L 3.7 5.0 3.3(L)  Chloride 98 - 111 mmol/L 99 102 103  CO2 22 - 32 mmol/L 26 26 25   Calcium 8.9 - 10.3 mg/dL 9.1 8.8(L) 9.2  Total Protein 6.5 - 8.1 g/dL 6.4(L) 6.2(L) -  Total Bilirubin 0.3 - 1.2 mg/dL 1.5(H) 1.8(H) -  Alkaline Phos 38 - 126 U/L 118 118 -  AST 15 - 41 U/L 70(H) 97(H) -  ALT 0 - 44 U/L 39 46(H) -    Imaging: CT HEAD WO CONTRAST  Result Date: 09/28/2020 CLINICAL DATA:  Recent left leg weakness and fall, initial encounter EXAM: CT HEAD WITHOUT CONTRAST TECHNIQUE: Contiguous axial images were obtained from the base of the skull through the vertex without intravenous contrast. COMPARISON:  09/26/2020 FINDINGS: Brain: Areas of prior ischemia in the left thalamus and deep white matter on the right are again noted and stable. No acute hemorrhage, acute infarction or space-occupying mass lesion are seen. Vascular: No hyperdense vessel or unexpected calcification. Skull: Normal. Negative for fracture or focal lesion. Sinuses/Orbits: No acute finding. Other: None. IMPRESSION: Chronic ischemic changes are noted stable from the recent exam. No acute abnormality noted. Electronically Signed   By: Inez Catalina M.D.   On: 09/28/2020 22:58   US Abdomen Limited RUQ (LIVER/GB)  Result Date:  09/28/2020 CLINICAL DATA:  Cirrhosis EXAM: ULTRASOUND ABDOMEN LIMITED RIGHT UPPER QUADRANT COMPARISON:  Ultrasound 03/03/2020 FINDINGS: Gallbladder: No gallstones. Slight increased wall thickness at 4.1 mm. No sonographic Murphy sign noted by sonographer. Common bile duct: Diameter: 4 mm Liver: Slightly echogenic. Coarse heterogeneous and nodular with enlarged left lobe. No focal hepatic abnormality. Portal vein is patent on color Doppler imaging with normal direction of blood flow towards the liver. Other: None. IMPRESSION: 1. Negative for gallstones. Slight gallbladder wall thickening which is nonspecific; no other sonographic  features to suggest acute gallbladder disease 2. Coarse echogenic nodular liver, corresponding to history of cirrhosis. Electronically Signed   By: Donavan Foil M.D.   On: 09/28/2020 16:52    Assessment/Plan:   Active Problems:   Alcohol withdrawal ([place])   Syncope   Patient Summary: [name] is a 55 y.o. with a pertinent PMH of alcohol use disorder,  hypertension, depression  presenting with altered mental status who presented with post fall and syncopal episode and admitted for further evaluation of syncopal episode and monitoring for alcohol withdrawal.    Acute alcohol withdrawal:  Patient scored high on CIWA overnight, scores of 15,17,13.  His high scores were due to agitation, anxiety, tremors.  His symptoms of somnolence and increased agitation I suspect are secondary to alcohol withdrawals.  We will continue to monitor these and continue him on low-dose Librium as well as as needed Ativan. -Continue with Librium 25 mg daily -We will continue multivitamin thiamine for now  -TOC to follow patient for alcohol cessation.  -Daily CMP  Fall Night resident paged by RN patient was found sitting on the floor next medication chart.  He was somnolent, but easily available to voice.  Given patient's thrombocytopenia and history of bleed CT head without contrast was obtained.  Negative for any intracranial process. -Continue to monitor and work with physical therapy/Occupational Therapy   Sinus tachycardia Patient's heart rate 90-100s.  Overall doing well, no other episodes of arrhythmia since administration of adenosine on admission.  Patient denies chest pain, shortness of breath, palpitations. -Continue to monitor on telemetry -Patient will need Holter monitor at discharge.  Elevated liver enzymes Echogenic nodular liver on ultrasound Patient with elevated liver enzymes during her admission.  Undercoverage ultrasound with evidence of coarse, echogenic nodular liver corresponding  with history of cirrhosis.  - Patient follow-up with GI on outpatient basis  Hypokalemia K of 3.7, continue to resupplement daily -Daily monitoring  Anemia-suspect secondary to anemia chronic disease/alcohol use Thrombocytopenia  2/2 alcohol use -Continue supplemental folate treatment -Daily CBC  Subacute/remote thalamic infarct Lipid panel with LDL 125, total cholesterol 187.  Hemoglobin A1c of 5.3. Patient will need to be started on high intensity statin. -Plan to start atorvastatin at discharge  Diet: Heart Healthy IVF: Discontinue IV fluids VTE: Heparin consult per pharmacy Code: Full PT/OT recs: Pending  Dispo: Anticipated discharge to Home in 1 to 2 days pending further work-up and evaluation.   Sanjuana Letters DO Internal Medicine Resident PGY-1 Pager [phone] Please contact the on call pager after 5 pm and on weekends at [phone].

## 2020-09-29 NOTE — Progress Notes (Signed)
Physical Therapy Treatment Patient Details Name: [name] MRN: [id number] DOB: 1965-12-03 Today's Date: 09/29/2020    History of Present Illness 55 year old male admitted 09/26/20 with PMHx of alcohol use disorder,  hypertension, depression  presenting with altered mental status. He notes that the day prior he fell into the bathtub but is unable to recall the events surrounding this. 3/29 fell in hospital (up walking by himself in room); 3/29 head CT negative for acute changes    PT Comments    Per RN, pt more agitated and withdrawing from ETOH. Has been given ativan but still awake and noted to be trying to remove bil hand mitts. Patient agreeable to get OOB and participated in short distance ambulation (out of room deferred due to pt's recent talk of leaving [name]) and ADL with OT. Ambulation balance actually improved, although still had periods of up to mod assist due to posterior lean.      Follow Up Recommendations  Home health PT     Equipment Recommendations  None recommended by PT    Recommendations for Other Services       Precautions / Restrictions Precautions Precautions: Fall Precaution Comments: falls more than 10x per month    Mobility  Bed Mobility Overal bed mobility: Needs Assistance Bed Mobility: Supine to Sit     Supine to sit: Supervision     General bed mobility comments: supervision for multiple lines    Transfers Overall transfer level: Needs assistance Equipment used: 1 person hand held assist;None Transfers: Sit to/from Stand Sit to Stand: Min assist;+2 safety/equipment         General transfer comment: posterior imbalance multiple times during transfers  Ambulation/Gait Ambulation/Gait assistance: Min assist;Mod assist Gait Distance (Feet): 30 Feet Assistive device: 1 person hand held assist;2 person hand held assist Gait Pattern/deviations: Staggering right;Staggering left;Leaning posteriorly;Shuffle     General Gait Details:  less posterior lean while walking; could not follow cues to increase step length or foot clearance   Stairs             Wheelchair Mobility    Modified Rankin (Stroke Patients Only)       Balance Overall balance assessment: Needs assistance Sitting-balance support: No upper extremity supported;Feet supported Sitting balance-Leahy Scale: Fair     Standing balance support: Single extremity supported Standing balance-Leahy Scale: Poor                              Cognition Arousal/Alertness: Lethargic;Suspect due to medications Behavior During Therapy: Impulsive (less due to lethargy) Overall Cognitive Status: No family/caregiver present to determine baseline cognitive functioning                                 General Comments: had ativan due to withdrawal; asking for cigarette repeatedly      Exercises      General Comments        Pertinent Vitals/Pain Pain Assessment: Faces Faces Pain Scale: No hurt    Home Living Family/patient expects to be discharged to:: Private residence Living Arrangements: Spouse/significant other;Children Available Help at Discharge: Family;Available PRN/intermittently Type of Home: House Home Access: Stairs to enter Entrance Stairs-Rails: Can reach both Home Layout: One level Home Equipment: Mar[place] - 4 wheels;Cane - single point Additional Comments: did not inquire about any bathroom equipment    Prior Function Level of Independence: Needs assistance  Gait / Transfers Assistance Needed: walks with/without cane inside home and with cane outside; most falls occur outside due to uneven terrain; discussed leaving outdoor chores to other family members (pt tries to roll garbage cans out)       PT Goals (current goals can now be found in the care plan section) Acute Rehab PT Goals Patient Stated Goal: agrees he wants to reduce his falls Time For Goal Achievement: 10/11/20 Potential to Achieve Goals:  Fair Progress towards PT goals: Progressing toward goals    Frequency    Min 3X/week      PT Plan Current plan remains appropriate    Co-evaluation PT/OT/SLP Co-Evaluation/Treatment: Yes Reason for Co-Treatment: Necessary to address cognition/behavior during functional activity;For patient/therapist safety          AM-PAC PT "6 Clicks" Mobility   Outcome Measure  Help needed turning from your back to your side while in a flat bed without using bedrails?: None Help needed moving from lying on your back to sitting on the side of a flat bed without using bedrails?: None Help needed moving to and from a bed to a chair (including a wheelchair)?: A Lot Help needed standing up from a chair using your arms (e.g., wheelchair or bedside chair)?: A Lot Help needed to walk in hospital room?: A Lot Help needed climbing 3-5 steps with a railing? : A Lot 6 Click Score: 16    End of Session Equipment Utilized During Treatment: Gait belt Activity Tolerance: Treatment limited secondary to medical complications (Comment) (lethargy from meds for withdrawal) Patient left: with call bell/phone within reach;in bed;with bed alarm set;with restraints reapplied (bil mitts) Nurse Communication: Mobility status PT Visit Diagnosis: Ataxic gait (R26.0);History of falling (Z91.81)     Time: 3545-6256 PT Time Calculation (min) (ACUTE ONLY): 26 min  Charges:  $Therapeutic Activity: 8-22 mins                      Arby Barrette, PT Pager [phone]    Rexanne Mano 09/29/2020, 12:28 PM

## 2020-09-30 ENCOUNTER — Ambulatory Visit: Payer: Medicaid Other | Admitting: Behavioral Health

## 2020-09-30 LAB — COMPREHENSIVE METABOLIC PANEL
ALT: 30 U/L (ref 0–44)
AST: 63 U/L — ABNORMAL HIGH (ref 15–41)
Albumin: 2.9 g/dL — ABNORMAL LOW (ref 3.5–5.0)
Alkaline Phosphatase: 109 U/L (ref 38–126)
Anion gap: 15 (ref 5–15)
BUN: 6 mg/dL (ref 6–20)
CO2: 18 mmol/L — ABNORMAL LOW (ref 22–32)
Calcium: 8.9 mg/dL (ref 8.9–10.3)
Chloride: 102 mmol/L (ref 98–111)
Creatinine, Ser: 0.67 mg/dL (ref 0.61–1.24)
GFR, Estimated: >60 mL/min (ref >60)
Glucose, Bld: 97 mg/dL (ref 70–99)
Potassium: 4.5 mmol/L (ref 3.5–5.1)
Sodium: 135 mmol/L (ref 135–145)
Total Bilirubin: 2.4 mg/dL — ABNORMAL HIGH (ref 0.3–1.2)
Total Protein: 6.5 g/dL (ref 6.5–8.1)

## 2020-09-30 LAB — CBC
HCT: 37.4 % — ABNORMAL LOW (ref 39.0–52.0)
Hemoglobin: 12.4 g/dL — ABNORMAL LOW (ref 13.0–17.0)
MCH: 34.5 pg — ABNORMAL HIGH (ref 26.0–34.0)
MCHC: 33.2 g/dL (ref 30.0–36.0)
MCV: 104.2 fL — ABNORMAL HIGH (ref 80.0–100.0)
Platelets: 77 10*3/uL — ABNORMAL LOW (ref 150–400)
RBC: 3.59 MIL/uL — ABNORMAL LOW (ref 4.22–5.81)
RDW: 13.5 % (ref 11.5–15.5)
WBC: 8.1 10*3/uL (ref 4.0–10.5)
nRBC: 0 % (ref 0.0–0.2)

## 2020-09-30 LAB — GLUCOSE, CAPILLARY: Glucose-Capillary: 106 mg/dL — ABNORMAL HIGH (ref 70–99)

## 2020-09-30 LAB — RAPID URINE DRUG SCREEN, HOSP PERFORMED
Amphetamines: NOT DETECTED
Barbiturates: NOT DETECTED
Benzodiazepines: POSITIVE — AB
Cocaine: POSITIVE — AB
Opiates: NOT DETECTED
Tetrahydrocannabinol: NOT DETECTED

## 2020-09-30 MED ORDER — CHLORDIAZEPOXIDE HCL 25 MG PO CAPS: 25.0000 mg | ORAL_CAPSULE | Freq: Once | ORAL | Status: DC

## 2020-09-30 NOTE — Discharge Summary (Addendum)
Name: [name] MRN: [id number] DOB: May 24, 1966 55 y.o. PCP: [name], MD  Date of Admission: 09/26/2020 10:29 AM Date of Discharge: 10/02/2020 Attending Physician: Dr. Angelia Mould  Discharge Diagnosis: Principal Problem:   Alcohol withdrawal [facility]) Active Problems:   Syncope    Discharge Medications: Allergies as of 10/02/2020   No Known Allergies     Medication List    TAKE these medications   atorvastatin 40 MG tablet Commonly known as: LIPITOR Take 1 tablet (40 mg total) by mouth daily. Start taking on: October 03, 2020   cyclobenzaprine 5 MG tablet Commonly known as: FLEXERIL TAKE 1 TABLET (5 MG TOTAL) BY MOUTH AT BEDTIME.   divalproex 250 MG DR tablet Commonly known as: DEPAKOTE Take 1 tablet (250 mg total) by mouth 2 (two) times daily.   DULoxetine 60 MG capsule Commonly known as: CYMBALTA TAKE 1 CAPSULE (60 MG TOTAL) BY MOUTH DAILY.   gabapentin 300 MG capsule Commonly known as: NEURONTIN TAKE 2 CAPSULES (600 MG TOTAL) BY MOUTH 3 (THREE) TIMES DAILY.   ibuprofen 600 MG tablet Commonly known as: ADVIL Take 1 tablet (600 mg total) by mouth daily as needed for mild pain or moderate pain (Leg pain).   ICY HOT EX Apply 1 application topically daily as needed (Knee pain).   lisinopril 10 MG tablet Commonly known as: ZESTRIL TAKE 1 TABLET (10 MG TOTAL) BY MOUTH DAILY.   thiamine 100 MG tablet Take 1 tablet (100 mg total) by mouth daily. Start taking on: October 03, 2020   Vitamin D3 1.25 MG (50000 UT) Caps Take 1 tablet by mouth once a week.       Disposition and follow-up:   [name] was discharged from [facility] in Stable condition.  At the hospital follow up visit please address:  1.  Follow-up:  A. Alcohol withdraw: encourage alcohol cessation    B. Remote thalamic stroke: start Lipitor 40 mg   C. Echogenic nodular liver on [place]: GI follow up and Hep B vaccine   D. Sinus tachycardia: may need Holter  monitor  2.  Labs / imaging needed at time of follow-up: CBC. CMP  3.  Pending labs/ test needing follow-up: NA  4.  Medication Changes  Started: Lipitor 40 mg and Thiamin 100 mg   Stopped:  Changed:  Abx -   End Date:  Follow-up Appointments: Internal [facility] Course by problem list:  Acute alcohol withdrawal Patient with history of severe alcohol use, drinking half a gallon of brandy a day.  Upon admission patient was started on CIWA protocol.  Initially he had no tremors or signs of withdrawal.  However on day 3 of his admission, approximately 72 hours since his last use of alcohol, patient had increasing somnolence as well as some confusion.  He was continued on CIWA protocol and slowly improving.  His Ativan and Librium was ultimately discontinued, which helped with patient's mental status.  Patient is alert, awake and oriented discharge today.  He will go home with home health PT/OT.   Episode of SVT 2/2 cocaine use Patient initially presented to emergency department after having syncopal episode where he found himself in his bathtub.  Presented to the emergency room, he was found to be in SVT with absent P waves.  He was given adenosine and this resolved.  Patient did state that he used cocaine 1 to 2 days prior to admission we believe that this was the event that led to  his SVT.  Urine drug screen came back positive for cocaine.  Patient remains tachycardic during this admission likely due to alcohol withdrawal.  His heart rate improves on discharge.  Cirrhosis Patient with long history of alcohol use, patient states that he is been drinking alcohol since he was in the [employer] many years ago.  During his admission his AST and ALT were elevated he also had cytopenia.  A right upper ultrasound was performed which showed nodularity suspicious for cirrhosis.  He will need follow-up on outpatient basis with GI.  Anemia  Thrombocytopenia Upon initial presentation  patient with anemia and thrombocytopenia.  I do believe that both of these are secondary to his alcohol use, cirrhosis may also be contributing.  These improved during his hospitalization.  He will need follow-up with primary care provider for repeat studies  Subacute/remote thalamic infarcts Imaging with evidence of prior stroke, CT with thalamic infarct new since 2010 with no acute infarct on MRI.  Do believe that this may be contributing to patient's gait instability and incontinence.  We will start him on statin on discharge.   Discharge Subjective: Patient is seen at bedside.  He is more alert, awake and oriented.  Patient is able to answer question appropriately.  States that he would like to go home to see his grandchild.  Patient is living with his fiance who can help him at home.  Discharge Exam:   BP 110/77 (BP Location: Left Arm)   Pulse (!) 101   Temp 97.9 F (36.6 C) (Oral)   Resp 20   Wt 62.9 kg   SpO2 100%   BMI 21.72 kg/m  Constitutional: Appears comfortable in no acute distress HENT: normocephalic atraumatic Eyes: conjunctiva non-erythematous Neck: supple Cardiovascular: regular rate and rhythm Pulmonary/Chest: normal work of breathing on room air Abdominal: soft, non-tender, non-distended MSK: normal bulk and tone Neurological: alert & oriented  Skin: warm and dry   Pertinent Labs, Studies, and Procedures:  CBC Latest Ref Rng & Units 10/02/2020 10/01/2020 09/30/2020  WBC 4.0 - 10.5 K/uL 6.4 8.2 8.1  Hemoglobin 13.0 - 17.0 g/dL 11.9(L) 12.3(L) 12.4(L)  Hematocrit 39.0 - 52.0 % 36.4(L) 37.1(L) 37.4(L)  Platelets 150 - 400 K/uL 134(L) 98(L) 77(L)    CMP Latest Ref Rng & Units 10/02/2020 10/01/2020 09/30/2020  Glucose 70 - 99 mg/dL 119(H) 89 97  BUN 6 - 20 mg/dL 11 8 6   Creatinine 0.61 - 1.24 mg/dL 0.88 0.74 0.67  Sodium 135 - 145 mmol/L 132(L) 134(L) 135  Potassium 3.5 - 5.1 mmol/L 4.0 3.9 4.5  Chloride 98 - 111 mmol/L 97(L) 98 102  CO2 22 - 32 mmol/L 26 27 18(L)   Calcium 8.9 - 10.3 mg/dL 9.0 9.4 8.9  Total Protein 6.5 - 8.1 g/dL - - 6.5  Total Bilirubin 0.3 - 1.2 mg/dL - - 2.4(H)  Alkaline Phos 38 - 126 U/L - - 109  AST 15 - 41 U/L - - 63(H)  ALT 0 - 44 U/L - - 30    DG Lumbar Spine Complete  Result Date: 09/26/2020 CLINICAL DATA:  Fall, back pain EXAM: LUMBAR SPINE - COMPLETE 4+ VIEW COMPARISON:  04/01/2020 FINDINGS: Five lumbar type vertebral segments. Vertebral body heights and alignment are maintained. No fracture identified. Intervertebral disc spaces are relatively preserved. Minimal degenerative endplate changes. Mild lower lumbar facet arthrosis. Scattered abdominal aortic atherosclerosis. IMPRESSION: Negative. Electronically Signed   By: Davina Poke D.O.   On: 09/26/2020 12:19   CT Head Wo Contrast  Result Date: 09/26/2020 CLINICAL DATA:  55 year old male with fall today with head and neck injury and altered mental status. EXAM: CT HEAD WITHOUT CONTRAST CT CERVICAL SPINE WITHOUT CONTRAST TECHNIQUE: Multidetector CT imaging of the head and cervical spine was performed following the standard protocol without intravenous contrast. Multiplanar CT image reconstructions of the cervical spine were also generated. COMPARISON:  11/29/2008 head CT and other studies FINDINGS: CT HEAD FINDINGS Brain: An age indeterminate LEFT thalamic infarct new since 2010 and may be subacute or remote. A remote RIGHT basal ganglia/internal capsule infarct is noted. Mild chronic small-vessel white matter ischemic changes are present. No mass or mass effect, hemorrhage, extra-axial collection, midline shift or hydrocephalus noted. Vascular: Carotid atherosclerotic calcifications are noted. Skull: Normal. Negative for fracture or focal lesion. Sinuses/Orbits: No acute finding. Other: None. CT CERVICAL SPINE FINDINGS Alignment: Normal. Skull base and vertebrae: No acute fracture. No primary bone lesion or focal pathologic process. Soft tissues and spinal canal: No  prevertebral fluid or swelling. No visible canal hematoma. Disc levels: Mild to moderate degenerative disc disease at C5-6 and C6-7 noted. Upper chest: No acute abnormality.  Emphysema changes noted. Other: None IMPRESSION: 1. 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 No evidence of hemorrhage. 2. No static evidence of acute injury to the cervical spine. 3. Mild chronic small-vessel white matter ischemic changes and remote RIGHT basal ganglia/internal capsule infarct. Electronically Signed   By: Margarette Canada M.D.   On: 09/26/2020 13:54   CT Cervical Spine Wo Contrast  Result Date: 09/26/2020 CLINICAL DATA:  55 year old male with fall today with head and neck injury and altered mental status. EXAM: CT HEAD WITHOUT CONTRAST CT CERVICAL SPINE WITHOUT CONTRAST TECHNIQUE: Multidetector CT imaging of the head and cervical spine was performed following the standard protocol without intravenous contrast. Multiplanar CT image reconstructions of the cervical spine were also generated. COMPARISON:  11/29/2008 head CT and other studies FINDINGS: CT HEAD FINDINGS Brain: An age indeterminate LEFT thalamic infarct new since 2010 and may be subacute or remote. A remote RIGHT basal ganglia/internal capsule infarct is noted. Mild chronic small-vessel white matter ischemic changes are present. No mass or mass effect, hemorrhage, extra-axial collection, midline shift or hydrocephalus noted. Vascular: Carotid atherosclerotic calcifications are noted. Skull: Normal. Negative for fracture or focal lesion. Sinuses/Orbits: No acute finding. Other: None. CT CERVICAL SPINE FINDINGS Alignment: Normal. Skull base and vertebrae: No acute fracture. No primary bone lesion or focal pathologic process. Soft tissues and spinal canal: No prevertebral fluid or swelling. No visible canal hematoma. Disc levels: Mild to moderate degenerative disc disease at C5-6 and C6-7 noted. Upper chest: No acute  abnormality.  Emphysema changes noted. Other: None IMPRESSION: 1. 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 No evidence of hemorrhage. 2. No static evidence of acute injury to the cervical spine. 3. Mild chronic small-vessel white matter ischemic changes and remote RIGHT basal ganglia/internal capsule infarct. Electronically Signed   By: Margarette Canada M.D.   On: 09/26/2020 13:54   MR BRAIN WO CONTRAST  Result Date: 09/26/2020 CLINICAL DATA:  Altered mental status, history of alcohol abuse EXAM: MRI HEAD WITHOUT CONTRAST TECHNIQUE: Multiplanar, multiecho pulse sequences of the brain and surrounding structures were obtained without intravenous contrast. COMPARISON:  None. FINDINGS: Motion degraded DWI, sagittal T1, and axial T2 sequences obtained. Patient could not tolerate remainder of the study. No definite reduced diffusion. No mass effect. Normal marrow signal is  preserved. Major vessel flow voids at the skull base are preserved. 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 Chronic bilateral thalamic infarcts. IMPRESSION: Motion degraded acquired sequences. Patient could not tolerate remainder of the study. No acute infarction identified. Electronically Signed   By: Macy Mis M.D.   On: 09/26/2020 18:59   DG Chest Port 1 View  Result Date: 09/26/2020 CLINICAL DATA:  Shortness of breath, alcohol intoxication EXAM: PORTABLE CHEST 1 VIEW COMPARISON:  Portable exam 1656 hours compared to 05/13/2020 FINDINGS: Normal heart size, mediastinal contours, and pulmonary vascularity. Lungs clear. No pulmonary infiltrate, pleural effusion, or pneumothorax. Osseous structures unremarkable. IMPRESSION: No acute abnormalities. Electronically Signed   By: Lavonia Dana M.D.   On: 09/26/2020 17:13   DG Hip Unilat W or Wo Pelvis 2-3 Views Left  Result Date: 09/26/2020 CLINICAL DATA:  Fall, left hip pain EXAM: DG HIP (WITH OR WITHOUT PELVIS) 2-3V LEFT  COMPARISON:  12/24/2017 FINDINGS: No acute fracture. No dislocation. Bilateral hip osteoarthritis with mild joint space loss and acetabular subchondral cystic change. Prior left femoral ORIF without evidence of complication. IMPRESSION: 1. No acute osseous abnormality. 2. Bilateral hip osteoarthritis. Electronically Signed   By: Davina Poke D.O.   On: 09/26/2020 12:21     Discharge Instructions: Discharge Instructions    Call MD for:  difficulty breathing, headache or visual disturbances   Complete by: As directed    Call MD for:  persistant nausea and vomiting   Complete by: As directed    Diet - low sodium heart healthy   Complete by: As directed    Discharge instructions   Complete by: As directed    Mr. Dalzell,  It is a pleasure taking care of you during this admission. You were hospitalized for alcohol withdrawal.  Please work on alcohol cessation.  We will set up a follow-up appointment with the internal medicine clinic.  Please start taking atorvastatin 40 mg for your cholesterol and thiamine 100 mg for alcohol use.  Take care  Dr. Alfonse Spruce   Increase activity slowly   Complete by: As directed       Signed: Gaylan Gerold, DO 10/02/2020, 3:03 PM   Pager: [phone]

## 2020-09-30 NOTE — Progress Notes (Signed)
Physician paged to notify that CIWAA score remains 16 despite 3mg  of ativan given in less than an hour. Patient attempting to get out of bed, he is redirectable but he is still attempting. Vital signs stable.

## 2020-09-30 NOTE — Progress Notes (Signed)
HD#3 Subjective:  Overnight Events: Continued to have increased agitation, CIWA scores elevated requiring ativan and librium.   Patient sleeping comfortably. Somnolent, but arousable. Patient denies being in any pain. Able to stick out his tongue. No acute concerns at this time.    Objective:  Vital signs in last 24 hours: Vitals:   09/29/20 1900 09/30/20 0000 09/30/20 0409 09/30/20 0500  BP: 125/86 (!) 128/96 128/79   Pulse: (!) 117 (!) 115 (!) 110   Resp: 20 (!) 22 (!) 24   Temp: 99.1 F (37.3 C) 98.1 F (36.7 C) 98.2 F (36.8 C)   TempSrc: Oral Oral Oral   SpO2: 98% 98% 98%   Weight:    62.3 kg   Supplemental O2: Room Air SpO2: 98 %   Physical Exam:  Constitutional: Lethargic HENT: normocephalic atraumatic Neck: supple Cardiovascular: Tachycardic, regular rhythm.  No murmurs gallops rubs appreciated Pulmonary/Chest: normal work of breathing on room air Abdominal: soft, non-tender, active bowel sounds MSK: normal bulk and tone. No lower extremity edema pressent Neurological: Somnolent, but arousable Skin: warm and dry  Filed Weights   09/27/20 0229 09/30/20 0500  Weight: 64.1 kg 62.3 kg   Bladder scan yesterday <150 mL   Intake/Output Summary (Last 24 hours) at 09/30/2020 0717 Last data filed at 09/30/2020 0400 Gross per 24 hour  Intake --  Output 750 ml  Net -750 ml   Net IO Since Admission: 237.71 mL [09/30/20 0717]  Pertinent Labs: CBC Latest Ref Rng & Units 09/30/2020 09/29/2020 09/28/2020  WBC 4.0 - 10.5 K/uL 8.1 7.6 7.1  Hemoglobin 13.0 - 17.0 g/dL 12.4(L) 12.7(L) 12.5(L)  Hematocrit 39.0 - 52.0 % 37.4(L) 38.3(L) 36.5(L)  Platelets 150 - 400 K/uL 77(L) 67(L) 61(L)    CMP Latest Ref Rng & Units 09/30/2020 09/29/2020 09/28/2020  Glucose 70 - 99 mg/dL 97 101(H) 93  BUN 6 - 20 mg/dL 6 <5(L) <5(L)  Creatinine 0.61 - 1.24 mg/dL 0.67 0.61 0.59(L)  Sodium 135 - 145 mmol/L 135 133(L) 134(L)  Potassium 3.5 - 5.1 mmol/L 4.5 3.7 5.0  Chloride 98 - 111  mmol/L 102 99 102  CO2 22 - 32 mmol/L 18(L) 26 26  Calcium 8.9 - 10.3 mg/dL 8.9 9.1 8.8(L)  Total Protein 6.5 - 8.1 g/dL 6.5 6.4(L) 6.2(L)  Total Bilirubin 0.3 - 1.2 mg/dL 2.4(H) 1.5(H) 1.8(H)  Alkaline Phos 38 - 126 U/L 109 118 118  AST 15 - 41 U/L 63(H) 70(H) 97(H)  ALT 0 - 44 U/L 30 39 46(H)    Imaging: No results found.  Assessment/Plan:   Active Problems:   Alcohol withdrawal ([place])   Syncope   Patient Summary: [name] is a 55 y.o. with a pertinent PMH of alcohol use disorder,  hypertension, depression  presenting with altered mental status who presented with post fall and syncopal episode and admitted for further evaluation of syncopal episode and monitoring for alcohol withdrawal.    Acute alcohol withdrawal Somulence  Continues to have high CIWA scores, uncertain if patient is withdrawing from other substance. He denied use earlier on admission. While he is outside of the typical withdrawal window, I do believe he is now actively going through EtOH withdrawal. Will continue to monitor for other possible etiologies of increased somnolence/confusion.  - Continue CIWA - Continue with Librium 25 mg daily - Urine UDS pending - We will continue multivitamin thiamine/folate - TOC to follow patient for alcohol cessation.  - Daily CMP  Sinus tachycardia Patient's heart rate 90-100s.  Overall doing well, no other episodes of arrhythmia since administration of adenosine on day of admission. Suspect persistent tachycardia/tachypnea in setting of suspected withdrawal.  -Continue to monitor on telemetry -Patient will need Holter monitor at discharge.  Elevated liver enzymes Echogenic nodular liver on ultrasound Patient with elevated liver enzymes during her admission.  RUQ ultrasound with evidence of coarse, echogenic nodular liver corresponding with history of cirrhosis.  - Patient follow-up with GI on outpatient basis - Will need hep B vaccine on outpatient  basis  Hypokalemia K of 4.5, continue to resupplement daily -Daily monitoring  Anemia-suspect secondary to anemia chronic disease/alcohol use Thrombocytopenia  2/2 alcohol use -Continue supplemental folate treatment -Daily CBC  Subacute/remote thalamic infarct Lipid panel with LDL 125, total cholesterol 187.  Hemoglobin A1c of 5.3. Patient will need to be started on high intensity statin. -Plan to start atorvastatin at discharge  Diet: Heart Healthy IVF: Discontinue IV fluids VTE: Heparin consult per pharmacy Code: Full PT/OT recs: home health PT  Dispo: Anticipated discharge to Home in 1 to 2 days pending further work-up and evaluation.   Sanjuana Letters DO Internal Medicine Resident PGY-1 Pager [phone] Please contact the on call pager after 5 pm and on weekends at [phone].

## 2020-10-01 DIAGNOSIS — F10239 Alcohol dependence with withdrawal, unspecified: Principal | ICD-10-CM

## 2020-10-01 LAB — BASIC METABOLIC PANEL
Anion gap: 9 (ref 5–15)
BUN: 8 mg/dL (ref 6–20)
CO2: 27 mmol/L (ref 22–32)
Calcium: 9.4 mg/dL (ref 8.9–10.3)
Chloride: 98 mmol/L (ref 98–111)
Creatinine, Ser: 0.74 mg/dL (ref 0.61–1.24)
GFR, Estimated: >60 mL/min (ref >60)
Glucose, Bld: 89 mg/dL (ref 70–99)
Potassium: 3.9 mmol/L (ref 3.5–5.1)
Sodium: 134 mmol/L — ABNORMAL LOW (ref 135–145)

## 2020-10-01 LAB — CBC
HCT: 37.1 % — ABNORMAL LOW (ref 39.0–52.0)
Hemoglobin: 12.3 g/dL — ABNORMAL LOW (ref 13.0–17.0)
MCH: 35.1 pg — ABNORMAL HIGH (ref 26.0–34.0)
MCHC: 33.2 g/dL (ref 30.0–36.0)
MCV: 106 fL — ABNORMAL HIGH (ref 80.0–100.0)
Platelets: 98 10*3/uL — ABNORMAL LOW (ref 150–400)
RBC: 3.5 MIL/uL — ABNORMAL LOW (ref 4.22–5.81)
RDW: 13.6 % (ref 11.5–15.5)
WBC: 8.2 10*3/uL (ref 4.0–10.5)
nRBC: 0 % (ref 0.0–0.2)

## 2020-10-01 LAB — PHOSPHORUS: Phosphorus: 3.5 mg/dL (ref 2.5–4.6)

## 2020-10-01 LAB — GLUCOSE, CAPILLARY: Glucose-Capillary: 89 mg/dL (ref 70–99)

## 2020-10-01 LAB — MAGNESIUM: Magnesium: 2.1 mg/dL (ref 1.7–2.4)

## 2020-10-01 NOTE — Progress Notes (Signed)
Physical Therapy Treatment Patient Details Name: [name] MRN: [id number] DOB: 11/09/1965 Today's Date: 10/01/2020    History of Present Illness 55 year old male admitted 09/26/20 with PMHx of alcohol use disorder,  hypertension, depression  presenting with altered mental status. He notes that the day prior he fell into the bathtub but is unable to recall the events surrounding this. 3/29 fell in hospital (up walking by himself in room); 3/29 head CT negative for acute changes    PT Comments    Pt continues to be lethargic requiring increased time to process commands and increased deficits in motor planning/coordination. Pt requiring min-mod A for all functional mobility tasks. Pt with increased posterior lean in standing and demonstrated difficulty with weight shifting due to pain in LLE. Pt will benefit from skilled PT to address deficits in balance, strength, coordination, endurance, gait and safety to maximize independence with functional mobility prior to discharge.     Follow Up Recommendations  Home health PT vs SNF;Supervision for mobility/OOB (pt with poor motor planning and processing with increased risk for falls. Pt will need S/assist when OOB)     Equipment Recommendations  None recommended by PT    Recommendations for Other Services       Precautions / Restrictions Precautions Precautions: Fall Precaution Comments: falls more than 10x per month Restrictions Weight Bearing Restrictions: No    Mobility  Bed Mobility Overal bed mobility: Needs Assistance Bed Mobility: Supine to Sit     Supine to sit: Mod assist     General bed mobility comments: increased time needed and assist needed for LLE and trunk management    Transfers Overall transfer level: Needs assistance Equipment used: 1 person hand held assist Transfers: Sit to/from Stand Sit to Stand: Min assist         General transfer comment: posterior imbalance multiple  times  Ambulation/Gait Ambulation/Gait assistance: Min assist;Mod assist Gait Distance (Feet): 6 Feet Assistive device: 1 person hand held assist       General Gait Details: side stepping to head of bed. attempted marching in place and unable to clear R LE due to pain when weight bearing through L. RN made aware   Stairs             Wheelchair Mobility    Modified Rankin (Stroke Patients Only)       Balance Overall balance assessment: Needs assistance Sitting-balance support: No upper extremity supported;Feet supported Sitting balance-Leahy Scale: Fair     Standing balance support: Single extremity supported Standing balance-Leahy Scale: Poor                              Cognition Arousal/Alertness: Lethargic Behavior During Therapy: Impulsive Overall Cognitive Status: No family/caregiver present to determine baseline cognitive functioning                                        Exercises      General Comments General comments (skin integrity, edema, etc.): RN aware of HR elevation as well as LLE pain      Pertinent Vitals/Pain Pain Assessment: 0-10 Pain Score: 8  Pain Location: LLE, RN notified    Home Living                      Prior Function  PT Goals (current goals can now be found in the care plan section) Acute Rehab PT Goals Patient Stated Goal: agrees he wants to reduce his falls PT Goal Formulation: With patient Time For Goal Achievement: 10/11/20 Potential to Achieve Goals: Fair Progress towards PT goals: Progressing toward goals    Frequency    Min 3X/week      PT Plan Discharge plan needs to be updated    Co-evaluation              AM-PAC PT "6 Clicks" Mobility   Outcome Measure  Help needed turning from your back to your side while in a flat bed without using bedrails?: None Help needed moving from lying on your back to sitting on the side of a flat bed without using  bedrails?: A Lot Help needed moving to and from a bed to a chair (including a wheelchair)?: A Lot Help needed standing up from a chair using your arms (e.g., wheelchair or bedside chair)?: A Little Help needed to walk in hospital room?: A Lot Help needed climbing 3-5 steps with a railing? : A Lot 6 Click Score: 15    End of Session Equipment Utilized During Treatment: Gait belt Activity Tolerance: Patient limited by lethargy Patient left: with call bell/phone within reach;in bed;with bed alarm set Nurse Communication: Mobility status PT Visit Diagnosis: Ataxic gait (R26.0);History of falling (Z91.81)     Time: 8264-1583 PT Time Calculation (min) (ACUTE ONLY): 13 min  Charges:  $Therapeutic Activity: 8-22 mins                     Lyanne Co, DPT Acute Rehabilitation Services [phone]   [place] 10/01/2020, 11:52 AM

## 2020-10-01 NOTE — Progress Notes (Addendum)
HD#4 Subjective:  Overnight Events: None   During evaluation at bedside this morning, patient is drowsy but responsive to voice.  Patient only oriented to self and place.  He remembers where he lives.  He denies any discomfort. Uses cane for ambulation at home. Reports he has felt unsteady when he has tried getting out of bed since being in the hospital.   Objective:  Vital signs in last 24 hours: Vitals:   09/30/20 1200 09/30/20 1929 10/01/20 0051 10/01/20 0317  BP: 134/88 115/78 116/78   Pulse: (!) 102     Resp: (!) 25 (!) 22 (!) 22   Temp: 97.9 F (36.6 C) 98.3 F (36.8 C) 98 F (36.7 C)   TempSrc: Axillary Oral Axillary   SpO2: 99% 99% 95%   Weight:    62.9 kg   Supplemental O2: Room Air SpO2: 95 %   Physical Exam:  Physical Exam Constitutional:      General: He is not in acute distress.    Comments: Somnolent but arousable to verbal stimulation.   HENT:     Head: Normocephalic.  Eyes:     General:        Right eye: No discharge.        Left eye: No discharge.  Cardiovascular:     Rate and Rhythm: Regular rhythm. Tachycardia present.  Pulmonary:     Effort: Pulmonary effort is normal. No respiratory distress.  Abdominal:     General: Bowel sounds are normal.  Musculoskeletal:     Right lower leg: No edema.     Left lower leg: No edema.  Skin:    General: Skin is warm.  Neurological:     Comments: Able to follow command. Strength 5/5 upper bilateral upper extremities     Filed Weights   09/27/20 0229 09/30/20 0500 10/01/20 0317  Weight: 64.1 kg 62.3 kg 62.9 kg    No intake or output data in the 24 hours ending 10/01/20 0540 Net IO Since Admission: 237.71 mL [10/01/20 0540]  Pertinent Labs: CBC Latest Ref Rng & Units 10/01/2020 09/30/2020 09/29/2020  WBC 4.0 - 10.5 K/uL 8.2 8.1 7.6  Hemoglobin 13.0 - 17.0 g/dL 12.3(L) 12.4(L) 12.7(L)  Hematocrit 39.0 - 52.0 % 37.1(L) 37.4(L) 38.3(L)  Platelets 150 - 400 K/uL 98(L) 77(L) 67(L)    CMP Latest Ref  Rng & Units 10/01/2020 09/30/2020 09/29/2020  Glucose 70 - 99 mg/dL 89 97 101(H)  BUN 6 - 20 mg/dL 8 6 <5(L)  Creatinine 0.61 - 1.24 mg/dL 0.74 0.67 0.61  Sodium 135 - 145 mmol/L 134(L) 135 133(L)  Potassium 3.5 - 5.1 mmol/L 3.9 4.5 3.7  Chloride 98 - 111 mmol/L 98 102 99  CO2 22 - 32 mmol/L 27 18(L) 26  Calcium 8.9 - 10.3 mg/dL 9.4 8.9 9.1  Total Protein 6.5 - 8.1 g/dL - 6.5 6.4(L)  Total Bilirubin 0.3 - 1.2 mg/dL - 2.4(H) 1.5(H)  Alkaline Phos 38 - 126 U/L - 109 118  AST 15 - 41 U/L - 63(H) 70(H)  ALT 0 - 44 U/L - 30 39    Imaging: No results found.  Assessment/Plan:   Active Problems:   Alcohol withdrawal ([place])   Syncope   Patient Summary: [name] is a 55 y.o. with a pertinent PMH of alcohol use disorder, hypertension, depression presenting with altered mental status who presented with post fall and syncopal episode and admitted for further evaluation of syncopal episode and monitoring for alcohol withdrawal.   Acute  alcohol withdrawal Somulence His CIWA was 2 yesterday and he required 3 mg of Ativan.  He has not required any Ativan today.  Patient however continued to be somnolent despite only had 3 mg of Ativan and 25 mg of Librium daily.  Patient was able to work with PT today but requiring increasing time to process.  We will continue to monitor at this time and continue PT. UDS came back positive for cocaine - Continue CIWA - Continue with Librium 25 mg daily - Continue multivitamin thiamine/folate - TOC to follow patient for alcohol cessation.   Sinus tachycardia His tachycardia persists likely in the setting of alcohol withdrawal.   Mag and Phos within good range.  Potassium 3.9. -Continue to monitor on telemetry -Patient will need Holter monitor at discharge.  Elevated liver enzymes Echogenic nodular liver on ultrasound Patient with elevated liver enzymes during her admission.  RUQ ultrasound with evidence of coarse, echogenic nodular liver corresponding  with history of cirrhosis.  - Patient follow-up with GI on outpatient basis - Will need hep B vaccine on outpatient basis  Anemia-suspect secondary to anemia chronic disease/alcohol use-stable Thrombocytopenia 2/2 alcohol use -improving -Continue supplemental folate treatment -Daily CBC  Subacute/remote thalamic infarct Lipid panel with LDL 125, total cholesterol 187.  Hemoglobin A1c of 5.3. Patient will need to be started on high intensity statin. -Plan to start atorvastatin at discharge  Diet: Heart Healthy IVF: None VTE: Heparin consult per pharmacy Code: Full PT/OT recs: home health PT  Dispo: Anticipated discharge to Home in 1 to 2 days pending further work-up and evaluation.  Gaylan Gerold, DO 10/01/2020, 5:40 AM Pager: [phone]  Please contact the on call pager after 5 pm and on weekends at [phone].

## 2020-10-01 NOTE — Progress Notes (Addendum)
Occupational Therapy Treatment Patient Details Name: Cainan Trull MRN: [id number] DOB: May 10, 1966 Today's Date: 10/01/2020    History of present illness 55 year old male admitted 09/26/20 with PMHx of alcohol use disorder,  hypertension, depression  presenting with altered mental status. He notes that the day prior he fell into the bathtub but is unable to recall the events surrounding this. 3/29 fell in hospital (up walking by himself in room); 3/29 head CT negative for acute changes   OT comments  Pt in bed upon arrival. Pt sat EOB mod A and required min - min guard A to maintain dynamic sitting balance. Pt making progress with functional goals, however requiring step by sep instructions for each aspect of ADL tasks seated EOB, increased time to process commands and increased deficits in motor planning/coordination. Pt requiring min-mod A for all functional mobility tasks. Session focused on UB bathing, dressing, grooming, oral care and cognition as it relates to ADL tasks. Pt returned to supine with min A at end of session. OT will continue to follow acutely to maximize level of function and safety  Follow Up Recommendations  Home health OT;Supervision/Assistance - 24 hour    Equipment Recommendations  None recommended by OT    Recommendations for Other Services      Precautions / Restrictions Precautions Precautions: Fall Precaution Comments: falls more than 10x per month Restrictions Weight Bearing Restrictions: No       Mobility Bed Mobility Overal bed mobility: Needs Assistance Bed Mobility: Supine to Sit;Sit to Supine     Supine to sit: Mod assist Sit to supine: Min assist   General bed mobility comments: increased time needed and assist needed, mod A to elevate trunk, min A with LEs onto bed    Transfers Overall transfer level: Needs assistance Equipment used: 1 person hand held assist;Rolling [place] (2 wheeled) Transfers: Sit to/from Stand Sit to Stand: Min  assist         General transfer comment: posterior leaning, unsteady    Balance Overall balance assessment: Needs assistance Sitting-balance support: No upper extremity supported;Feet supported Sitting balance-Leahy Scale: Fair Sitting balance - Comments: posterior leaning, pt required verbal and physical cues to correct sitting posture to upright   Standing balance support: Single extremity supported;During functional activity Standing balance-Leahy Scale: Poor                             ADL either performed or assessed with clinical judgement   ADL Overall ADL's : Needs assistance/impaired     Grooming: Min guard;Wash/dry face;Wash/dry hands;Oral care Grooming Details (indicate cue type and reason): min - min guard A for balance and support, mod step by step verbal cues to initiate and complete task Upper Body Bathing: Minimal assistance;Sitting Upper Body Bathing Details (indicate cue type and reason): mod A step by step verbal cues to initiate and complete, increased time required     Upper Body Dressing : Minimal assistance;Sitting Upper Body Dressing Details (indicate cue type and reason): mod A step by step verbal cues to complete, increased time       Toilet Transfer Details (indicate cue type and reason): simulated to chair and back to bed Toileting- Clothing Manipulation and Hygiene: Total assistance;Sit to/from stand         General ADL Comments: Pt limited by decreased cognition, decreased activity tolerance     Vision Baseline Vision/History: No visual deficits Patient Visual Report: No change from baseline  Perception     Praxis      Cognition Arousal/Alertness: Awake/alert Behavior During Therapy: Impulsive Overall Cognitive Status: No family/caregiver present to determine baseline cognitive functioning                                 General Comments: requiring increased time to process commands         Exercises     Shoulder Instructions       General Comments RN aware of HR elevation as well as LLE pain    Pertinent Vitals/ Pain       Pain Assessment: No/denies pain Pain Score: 0-No pain Pain Location: LLE, RN notified Pain Intervention(s): Monitored during session;Repositioned  Home Living                                          Prior Functioning/Environment              Frequency  Min 2X/week        Progress Toward Goals  OT Goals(current goals can now be found in the care plan section)  Progress towards OT goals: Progressing toward goals  Acute Rehab OT Goals Patient Stated Goal: agrees he wants to reduce his falls  Plan Discharge plan remains appropriate    Co-evaluation                 AM-PAC OT "6 Clicks" Daily Activity     Outcome Measure   Help from another person eating meals?: None Help from another person taking care of personal grooming?: A Little Help from another person toileting, which includes using toliet, bedpan, or urinal?: Total Help from another person bathing (including washing, rinsing, drying)?: A Lot Help from another person to put on and taking off regular upper body clothing?: A Little Help from another person to put on and taking off regular lower body clothing?: A Lot 6 Click Score: 15    End of Session Equipment Utilized During Treatment: Gait belt;Rolling [place]  OT Visit Diagnosis: Muscle weakness (generalized) (M62.81);Other symptoms and signs involving cognitive function   Activity Tolerance Patient limited by fatigue   Patient Left in bed;with call bell/phone within reach;with bed alarm set   Nurse Communication Mobility status        Time: 8250-5397 OT Time Calculation (min): 27 min  Charges: OT General Charges $OT Visit: 1 Visit OT Treatments $Self Care/Home Management : 8-22 mins $Therapeutic Activity: 8-22 mins     Britt Bottom 10/01/2020, 1:01 PM

## 2020-10-02 ENCOUNTER — Other Ambulatory Visit (HOSPITAL_COMMUNITY): Payer: Self-pay

## 2020-10-02 ENCOUNTER — Encounter (HOSPITAL_COMMUNITY): Payer: Self-pay | Admitting: Internal Medicine

## 2020-10-02 LAB — BASIC METABOLIC PANEL
Anion gap: 9 (ref 5–15)
BUN: 11 mg/dL (ref 6–20)
CO2: 26 mmol/L (ref 22–32)
Calcium: 9 mg/dL (ref 8.9–10.3)
Chloride: 97 mmol/L — ABNORMAL LOW (ref 98–111)
Creatinine, Ser: 0.88 mg/dL (ref 0.61–1.24)
GFR, Estimated: >60 mL/min (ref >60)
Glucose, Bld: 119 mg/dL — ABNORMAL HIGH (ref 70–99)
Potassium: 4 mmol/L (ref 3.5–5.1)
Sodium: 132 mmol/L — ABNORMAL LOW (ref 135–145)

## 2020-10-02 LAB — CBC
HCT: 36.4 % — ABNORMAL LOW (ref 39.0–52.0)
Hemoglobin: 11.9 g/dL — ABNORMAL LOW (ref 13.0–17.0)
MCH: 34 pg (ref 26.0–34.0)
MCHC: 32.7 g/dL (ref 30.0–36.0)
MCV: 104 fL — ABNORMAL HIGH (ref 80.0–100.0)
Platelets: 134 10*3/uL — ABNORMAL LOW (ref 150–400)
RBC: 3.5 MIL/uL — ABNORMAL LOW (ref 4.22–5.81)
RDW: 13.2 % (ref 11.5–15.5)
WBC: 6.4 10*3/uL (ref 4.0–10.5)
nRBC: 0 % (ref 0.0–0.2)

## 2020-10-02 LAB — GLUCOSE, CAPILLARY: Glucose-Capillary: 104 mg/dL — ABNORMAL HIGH (ref 70–99)

## 2020-10-02 MED ORDER — ATORVASTATIN CALCIUM 40 MG PO TABS
40.0000 mg | ORAL_TABLET | Freq: Every day | ORAL | 2 refills | Status: DC
  Filled 2020-10-02: qty 30, 30d supply, fill #0

## 2020-10-02 MED ORDER — ATORVASTATIN CALCIUM 40 MG PO TABS
40.0000 mg | ORAL_TABLET | Freq: Every day | ORAL | Status: DC
  Administered 2020-10-02: 40 mg via ORAL
  Filled 2020-10-02: qty 1

## 2020-10-02 MED ORDER — THIAMINE HCL 100 MG PO TABS
100.0000 mg | ORAL_TABLET | Freq: Every day | ORAL | 2 refills | Status: DC
  Filled 2020-10-02: qty 30, 30d supply, fill #0

## 2020-10-02 MED ORDER — THIAMINE HCL 100 MG PO TABS: 100.0000 mg | ORAL_TABLET | Freq: Every day | ORAL | 2 refills | Status: DC

## 2020-10-02 MED ORDER — ATORVASTATIN CALCIUM 40 MG PO TABS: 40.0000 mg | ORAL_TABLET | Freq: Every day | ORAL | 2 refills | Status: DC

## 2020-10-02 NOTE — Progress Notes (Signed)
Fiance here with clothes. Tried to help patient get underwear on. Took NT and fiance to hold patient up while RN pulled up pants. Patient unsteady on feet, but stating he is leaving and he will be fine.  MD called to speak to fiance.

## 2020-10-02 NOTE — Progress Notes (Signed)
Went over discharge paperwork with patient. PIV removed and patient dressed. Waiting for ride at this time.

## 2020-10-02 NOTE — Discharge Instructions (Signed)
Alcohol Withdrawal Syndrome When a person who drinks a lot of alcohol stops drinking, he or she may have unpleasant and serious symptoms. These symptoms are called alcohol withdrawal syndrome. This condition may be mild or severe. It can be life-threatening. It can cause:  Shaking that you cannot control (tremor).  Sweating.  Headache.  Feeling fearful, upset, grouchy, or depressed.  Trouble sleeping (insomnia).  Nightmares.  Fast or uneven heartbeats (palpitations).  Alcohol cravings.  Feeling sick to your stomach (nausea).  Throwing up (vomiting).  Being bothered by light and sounds.  Confusion.  Trouble thinking clearly.  Not being hungry (loss of appetite).  Big changes in mood (mood swings). If you have all of the following symptoms at the same time, get help right away:  High blood pressure.  Fast heartbeat.  Trouble breathing.  Seizures.  Seeing, hearing, feeling, smelling, or tasting things that are not there (hallucinations). These symptoms are known as delirium tremens (DTs). They must be treated at the hospital right away. Follow these instructions at home:  Take over-the-counter and prescription medicines only as told by your doctor. This includes vitamins.  Do not drink alcohol.  Do not drive until your doctor says that this is safe for you.  Have someone stay with you or be available in case you need help. This should be someone you trust. This person can help you with your symptoms. He or she can also help you to not drink.  Drink enough fluid to keep your pee (urine) pale yellow.  Think about joining a support group or a treatment program to help you stop drinking.  Keep all follow-up visits as told by your doctor. This is important.   Contact a doctor if:  Your symptoms get worse.  You cannot eat or drink without throwing up.  You have a hard time not drinking alcohol.  You cannot stop drinking alcohol. Get help right away  if:  You have fast or uneven heartbeats.  You have chest pain.  You have trouble breathing.  You have a seizure for the first time.  You see, hear, feel, smell, or taste something that is not there.  You get very confused. Summary  When a person who drinks a lot of alcohol stops drinking, he or she may have serious symptoms. This is called alcohol withdrawal syndrome.  Delirium tremens (DTs) is a group of life-threatening symptoms. You should get help right away if you have these symptoms.  Think about joining an alcohol support group or a treatment program. This information is not intended to replace advice given to you by your health care provider. Make sure you discuss any questions you have with your health care provider. Document Revised: 06/01/2017 Document Reviewed: 02/23/2017 Elsevier Patient Education  2021 [place] Prevention in the Home, Adult Falls can cause injuries and can happen to people of all ages. There are many things you can do to make your home safe and to help prevent falls. Ask for help when making these changes. What actions can I take to prevent falls? General Instructions  Use good lighting in all rooms. Replace any light bulbs that burn out.  Turn on the lights in dark areas. Use night-lights.  Keep items that you use often in easy-to-reach places. Lower the shelves around your home if needed.  Set up your furniture so you have a clear path. Avoid moving your furniture around.  Do not have throw rugs or other things on the  floor that can make you trip.  Avoid walking on wet floors.  If any of your floors are uneven, fix them.  Add color or contrast paint or tape to clearly mark and help you see: ? Grab bars or handrails. ? First and last steps of staircases. ? Where the edge of each step is.  If you use a stepladder: ? Make sure that it is fully opened. Do not climb a closed stepladder. ? Make sure the sides of the stepladder  are locked in place. ? Ask someone to hold the stepladder while you use it.  Know where your pets are when moving through your home. What can I do in the bathroom?  Keep the floor dry. Clean up any water on the floor right away.  Remove soap buildup in the tub or shower.  Use nonskid mats or decals on the floor of the tub or shower.  Attach bath mats securely with double-sided, nonslip rug tape.  If you need to sit down in the shower, use a plastic, nonslip stool.  Install grab bars by the toilet and in the tub and shower. Do not use towel bars as grab bars.      What can I do in the bedroom?  Make sure that you have a light by your bed that is easy to reach.  Do not use any sheets or blankets for your bed that hang to the floor.  Have a firm chair with side arms that you can use for support when you get dressed. What can I do in the kitchen?  Clean up any spills right away.  If you need to reach something above you, use a step stool with a grab bar.  Keep electrical cords out of the way.  Do not use floor polish or wax that makes floors slippery. What can I do with my stairs?  Do not leave any items on the stairs.  Make sure that you have a light switch at the top and the bottom of the stairs.  Make sure that there are handrails on both sides of the stairs. Fix handrails that are broken or loose.  Install nonslip stair treads on all your stairs.  Avoid having throw rugs at the top or bottom of the stairs.  Choose a carpet that does not hide the edge of the steps on the stairs.  Check carpeting to make sure that it is firmly attached to the stairs. Fix carpet that is loose or worn. What can I do on the outside of my home?  Use bright outdoor lighting.  Fix the edges of walkways and driveways and fix any cracks.  Remove anything that might make you trip as you walk through a door, such as a raised step or threshold.  Trim any bushes or trees on paths to your  home.  Check to see if handrails are loose or broken and that both sides of all steps have handrails.  Install guardrails along the edges of any raised decks and porches.  Clear paths of anything that can make you trip, such as tools or rocks.  Have leaves, snow, or ice cleared regularly.  Use sand or salt on paths during winter.  Clean up any spills in your garage right away. This includes grease or oil spills. What other actions can I take?  Wear shoes that: ? Have a low heel. Do not wear high heels. ? Have rubber bottoms. ? Feel good on your feet and fit well. ?  Are closed at the toe. Do not wear open-toe sandals.  Use tools that help you move around if needed. These include: ? Canes. ? Walkers. ? Scooters. ? Crutches.  Review your medicines with your doctor. Some medicines can make you feel dizzy. This can increase your chance of falling. Ask your doctor what else you can do to help prevent falls. Where to find more information  Centers for Disease Control and Prevention, [employer]: http://www.wolf.info/  National Institute on Aging: http://kim-miller.com/ Contact a doctor if:  You are afraid of falling at home.  You feel weak, drowsy, or dizzy at home.  You fall at home. Summary  There are many simple things that you can do to make your home safe and to help prevent falls.  Ways to make your home safe include removing things that can make you trip and installing grab bars in the bathroom.  Ask for help when making these changes in your home. This information is not intended to replace advice given to you by your health care provider. Make sure you discuss any questions you have with your health care provider. Document Revised: 01/21/2020 Document Reviewed: 01/21/2020 Elsevier Patient Education  [place] were seen in the emergency department for an unresponsive episode.  You had blood work EKG CAT scan of your head and an MRI of your brain.  There was evidence of old  strokes.  This will need to be followed up with your doctor.  You were also showing some signs of alcohol withdrawal.  Please consider detox.  Return to the emergency department if any worsening or concerning symptoms.

## 2020-10-02 NOTE — Progress Notes (Signed)
MD came to bedside to discuss patient condition with fiance.  Agreeable to DC and patient refusing to stay. Fiance verbalized understanding of DC instructions and medications.  Patient wheeled to DC area in wheelchair by tech.

## 2020-10-06 ENCOUNTER — Encounter: Payer: Self-pay | Admitting: Internal Medicine

## 2020-10-06 ENCOUNTER — Ambulatory Visit (INDEPENDENT_AMBULATORY_CARE_PROVIDER_SITE_OTHER): Payer: Medicaid Other | Admitting: Internal Medicine

## 2020-10-06 ENCOUNTER — Ambulatory Visit (HOSPITAL_COMMUNITY)
Admission: RE | Admit: 2020-10-06 | Discharge: 2020-10-06 | Disposition: A | Discharge status: Home / Self Care | Payer: Medicaid Other | Source: Ambulatory Visit | Attending: Internal Medicine | Admitting: Internal Medicine

## 2020-10-06 ENCOUNTER — Observation Stay (HOSPITAL_COMMUNITY)
Admission: AD | Admit: 2020-10-06 | Discharge: 2020-10-08 | Disposition: A | Discharge status: Home / Self Care | Payer: Medicaid Other | Source: Ambulatory Visit | Attending: Internal Medicine | Admitting: Internal Medicine

## 2020-10-06 ENCOUNTER — Other Ambulatory Visit (HOSPITAL_COMMUNITY): Payer: Self-pay

## 2020-10-06 VITALS — BP 119/89 | HR 102 | Temp 98.3°F | Ht 67.0 in | Wt 140.0 lb

## 2020-10-06 DIAGNOSIS — F17210 Nicotine dependence, cigarettes, uncomplicated: Secondary | ICD-10-CM | POA: Diagnosis not present

## 2020-10-06 DIAGNOSIS — R000 Tachycardia, unspecified: Secondary | ICD-10-CM

## 2020-10-06 DIAGNOSIS — K7469 Other cirrhosis of liver: Secondary | ICD-10-CM | POA: Insufficient documentation

## 2020-10-06 DIAGNOSIS — E860 Dehydration: Principal | ICD-10-CM | POA: Insufficient documentation

## 2020-10-06 DIAGNOSIS — F1010 Alcohol abuse, uncomplicated: Secondary | ICD-10-CM | POA: Insufficient documentation

## 2020-10-06 DIAGNOSIS — Z79899 Other long term (current) drug therapy: Secondary | ICD-10-CM | POA: Diagnosis not present

## 2020-10-06 DIAGNOSIS — R55 Syncope and collapse: Secondary | ICD-10-CM

## 2020-10-06 DIAGNOSIS — I10 Essential (primary) hypertension: Secondary | ICD-10-CM | POA: Insufficient documentation

## 2020-10-06 DIAGNOSIS — F10230 Alcohol dependence with withdrawal, uncomplicated: Secondary | ICD-10-CM

## 2020-10-06 DIAGNOSIS — F10930 Alcohol use, unspecified with withdrawal, uncomplicated: Secondary | ICD-10-CM

## 2020-10-06 DIAGNOSIS — R531 Weakness: Secondary | ICD-10-CM | POA: Diagnosis present

## 2020-10-06 LAB — COMPREHENSIVE METABOLIC PANEL
ALT: 38 U/L (ref 0–44)
AST: 53 U/L — ABNORMAL HIGH (ref 15–41)
Albumin: 3.2 g/dL — ABNORMAL LOW (ref 3.5–5.0)
Alkaline Phosphatase: 127 U/L — ABNORMAL HIGH (ref 38–126)
Anion gap: 7 (ref 5–15)
BUN: 10 mg/dL (ref 6–20)
CO2: 29 mmol/L (ref 22–32)
Calcium: 9.4 mg/dL (ref 8.9–10.3)
Chloride: 99 mmol/L (ref 98–111)
Creatinine, Ser: 1.07 mg/dL (ref 0.61–1.24)
GFR, Estimated: >60 mL/min (ref >60)
Glucose, Bld: 100 mg/dL — ABNORMAL HIGH (ref 70–99)
Potassium: 4.2 mmol/L (ref 3.5–5.1)
Sodium: 135 mmol/L (ref 135–145)
Total Bilirubin: 1.4 mg/dL — ABNORMAL HIGH (ref 0.3–1.2)
Total Protein: 7.6 g/dL (ref 6.5–8.1)

## 2020-10-06 LAB — CBC
HCT: 37.5 % — ABNORMAL LOW (ref 39.0–52.0)
Hemoglobin: 12 g/dL — ABNORMAL LOW (ref 13.0–17.0)
MCH: 33.6 pg (ref 26.0–34.0)
MCHC: 32 g/dL (ref 30.0–36.0)
MCV: 105 fL — ABNORMAL HIGH (ref 80.0–100.0)
Platelets: 215 10*3/uL (ref 150–400)
RBC: 3.57 MIL/uL — ABNORMAL LOW (ref 4.22–5.81)
RDW: 13.1 % (ref 11.5–15.5)
WBC: 9 10*3/uL (ref 4.0–10.5)
nRBC: 0 % (ref 0.0–0.2)

## 2020-10-06 LAB — ETHANOL: Alcohol, Ethyl (B): <10 mg/dL (ref <10)

## 2020-10-06 LAB — RAPID URINE DRUG SCREEN, HOSP PERFORMED
Amphetamines: NOT DETECTED
Barbiturates: NOT DETECTED
Benzodiazepines: POSITIVE — AB
Cocaine: NOT DETECTED
Opiates: NOT DETECTED
Tetrahydrocannabinol: NOT DETECTED

## 2020-10-06 LAB — SARS CORONAVIRUS 2 (TAT 6-24 HRS): SARS Coronavirus 2: NEGATIVE

## 2020-10-06 MED ORDER — ACETAMINOPHEN 650 MG RE SUPP: 650.0000 mg | Freq: Four times a day (QID) | RECTAL | Status: DC | PRN

## 2020-10-06 MED ORDER — ONDANSETRON HCL 4 MG PO TABS: 4.0000 mg | ORAL_TABLET | Freq: Four times a day (QID) | ORAL | Status: DC | PRN

## 2020-10-06 MED ORDER — ACETAMINOPHEN 325 MG PO TABS
650.0000 mg | ORAL_TABLET | Freq: Four times a day (QID) | ORAL | Status: DC | PRN
  Administered 2020-10-06: 650 mg via ORAL
  Filled 2020-10-06: qty 2

## 2020-10-06 MED ORDER — SODIUM CHLORIDE 0.9 % IV BOLUS
1000.0000 mL | Freq: Once | INTRAVENOUS | Status: AC
  Administered 2020-10-06: 1000 mL via INTRAVENOUS

## 2020-10-06 MED ORDER — SODIUM CHLORIDE 0.9% FLUSH
3.0000 mL | Freq: Two times a day (BID) | INTRAVENOUS | Status: DC
  Administered 2020-10-06 – 2020-10-08 (×4): 3 mL via INTRAVENOUS

## 2020-10-06 MED ORDER — THIAMINE HCL 100 MG PO TABS
100.0000 mg | ORAL_TABLET | Freq: Every day | ORAL | Status: DC
  Administered 2020-10-06 – 2020-10-08 (×3): 100 mg via ORAL
  Filled 2020-10-06 (×3): qty 1

## 2020-10-06 MED ORDER — THIAMINE HCL 100 MG/ML IJ SOLN
100.0000 mg | Freq: Every day | INTRAMUSCULAR | Status: DC
Start: 2020-10-06 — End: 2020-10-08
  Filled 2020-10-06 (×2): qty 2

## 2020-10-06 MED ORDER — ONDANSETRON HCL 4 MG/2ML IJ SOLN: 4.0000 mg | Freq: Four times a day (QID) | INTRAMUSCULAR | Status: DC | PRN

## 2020-10-06 MED ORDER — POLYETHYLENE GLYCOL 3350 17 G PO PACK
17.0000 g | PACK | Freq: Every day | ORAL | Status: DC | PRN
  Administered 2020-10-06: 17 g via ORAL
  Filled 2020-10-06: qty 1

## 2020-10-06 MED ORDER — SODIUM CHLORIDE 0.9 % IV SOLN: INTRAVENOUS | Status: AC

## 2020-10-06 MED ORDER — FOLIC ACID 1 MG PO TABS
1.0000 mg | ORAL_TABLET | Freq: Every day | ORAL | Status: DC
  Administered 2020-10-06 – 2020-10-08 (×3): 1 mg via ORAL
  Filled 2020-10-06 (×3): qty 1

## 2020-10-06 MED ORDER — ADULT MULTIVITAMIN W/MINERALS CH
1.0000 | ORAL_TABLET | Freq: Every day | ORAL | Status: DC
  Administered 2020-10-06 – 2020-10-08 (×3): 1 via ORAL
  Filled 2020-10-06 (×3): qty 1

## 2020-10-06 MED ORDER — ENOXAPARIN SODIUM 40 MG/0.4ML ~~LOC~~ SOLN
40.0000 mg | SUBCUTANEOUS | Status: DC
  Administered 2020-10-07 – 2020-10-08 (×2): 40 mg via SUBCUTANEOUS
  Filled 2020-10-06 (×2): qty 0.4
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
## 2020-10-06 NOTE — Assessment & Plan Note (Addendum)
Noted to have episode of narrow complex tachycardia on recent admission. Needed dose of adenosine. 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 No further syncopal episode since discharge but persistently tachycardic on exam this visit. Will get EKG to further assess  - EKG  Addendum: EKG showing sinus tachycardia, borderline qt prolongation. Tachycardia likely due to dehydration. Will monitor with fluids
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
## 2020-10-06 NOTE — Progress Notes (Signed)
[name] is a 55 y.o. male patient admitted. Awake, alert - oriented  X 4 - no acute distress noted.  VSS - Blood pressure (!) 130/91, pulse 99, temperature 98.1 F (36.7 C), temperature source Oral, SpO2 99 %.    IV in place, occlusive dsg intact without redness. Phlebitis noticed on left basilic vein from previous admission, will continue to monitor and assess as well as apply ice.   Orientation to room, and floor completed.  Admission INP armband ID verified with patient/family, and in place.   SR up x 2, fall assessment complete, with patient able to verbalize understanding of risk associated with falls, and verbalized understanding to call nsg before up out of bed.  Call light within reach, patient able to voice understanding. Bed alarm on, high fall risk protocols implemented. No evidence of skin break down noted on exam.  Admission nurse notified of admission.     Will cont to eval and treat per MD orders.  Gordy Savers, RN 10/06/2020 3:30 PM

## 2020-10-06 NOTE — Assessment & Plan Note (Addendum)
[name] is a 55 yo M w/ PMH of alcohol abuse presenting to [facility] for post-hospital discharge after recent admission for syncope. 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 Also thought to have possible alcohol withdrawal. He was treated with supportive care and discharged home. Since being at home, his fiance mentions that he has been unable to care for himself due to profound generalized weakness and persistent somnolence. She mentions that he does not have access to alcohol at home and he as not been drunk at all since discharge and he has been taking his medications as prescribed. She also mentions that he has been unable to feed himself or ambulate without assistance. She has been doing her best to provide 24 hr care but she has to spend her daytime at work and cannot supervise 24 hours. She mentions his somnolence has persisted since discharge and is not associated with his gabapentin or depakote as he only takes these meds intermittently.  A/P Presenting with f/u for recent admission for syncope. No further syncopal episode since discharge but noted to have generalized weakness and somnolence on exam this visit. Also appear dry with hypotension and tachycardia. Chart review shows patient may have insisted on discharge home despite continued weakness and somnolence noted by clinical team and OT/PT. Persistent weakness possibly due to dehydration in setting of poor oral intake. Other differential includes alcohol or drug intoxication. Will provide IV fluid resuscitation in clinic and start work-up with labs. Will need admission for observation after fluid bolus for safety and capacity to care for himself at home. May need dispo to SNF.    - 1L bolus now - Cbc, cmp, ethanol, uds - COVID test for pre-admission - Admission to teaching service  Re-examined [name] after fluid bolus. Continues to be somnolent and weak but BP slightly improved. Have bed for admission.

## 2020-10-06 NOTE — Assessment & Plan Note (Signed)
Has hx of alcohol abuse. On recent admission, somnolence and weakness thought to be in part due to alcohol withdrawal with undertectable ethanol level on admission. Treated with benzodiazepines. Since discharge, his fiance and he mentions that he has not drank any alcohol at all. Mostly water and orange juice. However appears very somnolent on exam this visit.  A/P Somnolent with profound weakness on exam this visit. Possibly due to dehydration and poor oral intake vs intoxication with alcohol / substance use. Will screen.  - Ethanol, UDS - Monitor with fluids

## 2020-10-07 DIAGNOSIS — R531 Weakness: Secondary | ICD-10-CM | POA: Diagnosis not present

## 2020-10-07 DIAGNOSIS — E860 Dehydration: Secondary | ICD-10-CM | POA: Diagnosis not present

## 2020-10-07 DIAGNOSIS — F1020 Alcohol dependence, uncomplicated: Secondary | ICD-10-CM | POA: Diagnosis not present

## 2020-10-07 DIAGNOSIS — R260 Ataxic gait: Secondary | ICD-10-CM | POA: Diagnosis not present

## 2020-10-07 LAB — COMPREHENSIVE METABOLIC PANEL
ALT: 31 U/L (ref 0–44)
AST: 43 U/L — ABNORMAL HIGH (ref 15–41)
Albumin: 2.4 g/dL — ABNORMAL LOW (ref 3.5–5.0)
Alkaline Phosphatase: 98 U/L (ref 38–126)
Anion gap: 4 — ABNORMAL LOW (ref 5–15)
BUN: 9 mg/dL (ref 6–20)
CO2: 27 mmol/L (ref 22–32)
Calcium: 8 mg/dL — ABNORMAL LOW (ref 8.9–10.3)
Chloride: 106 mmol/L (ref 98–111)
Creatinine, Ser: 0.64 mg/dL (ref 0.61–1.24)
GFR, Estimated: >60 mL/min (ref >60)
Glucose, Bld: 108 mg/dL — ABNORMAL HIGH (ref 70–99)
Potassium: 3.8 mmol/L (ref 3.5–5.1)
Sodium: 137 mmol/L (ref 135–145)
Total Bilirubin: 0.8 mg/dL (ref 0.3–1.2)
Total Protein: 5.9 g/dL — ABNORMAL LOW (ref 6.5–8.1)

## 2020-10-07 LAB — MAGNESIUM: Magnesium: 1.8 mg/dL (ref 1.7–2.4)

## 2020-10-07 LAB — CBC
HCT: 29.8 % — ABNORMAL LOW (ref 39.0–52.0)
Hemoglobin: 9.7 g/dL — ABNORMAL LOW (ref 13.0–17.0)
MCH: 34.4 pg — ABNORMAL HIGH (ref 26.0–34.0)
MCHC: 32.6 g/dL (ref 30.0–36.0)
MCV: 105.7 fL — ABNORMAL HIGH (ref 80.0–100.0)
Platelets: 169 10*3/uL (ref 150–400)
RBC: 2.82 MIL/uL — ABNORMAL LOW (ref 4.22–5.81)
RDW: 13.1 % (ref 11.5–15.5)
WBC: 6.7 10*3/uL (ref 4.0–10.5)
nRBC: 0 % (ref 0.0–0.2)

## 2020-10-07 LAB — PHOSPHORUS: Phosphorus: 3 mg/dL (ref 2.5–4.6)

## 2020-10-07 LAB — GLUCOSE, CAPILLARY: Glucose-Capillary: 81 mg/dL (ref 70–99)

## 2020-10-07 NOTE — Evaluation (Signed)
Occupational Therapy Evaluation Patient Details Name: [name] MRN: [id number] DOB: 1966/05/05 Today's Date: 10/07/2020    History of Present Illness [name] is a 55 year old male with past medical history of alcohol use disorder, hypertension, depression who was admitted to the hospital from internal medicine clinic for generalized weakness.  Patient was admitted on 3/27 for altered mental status likely due to alcohol withdrawal. He was placed on CIWA with Ativan with high CIWA score.  Patient was somnolent on last admission, which improved with discontinuing Ativan and Librium. Pt now back in hospital after being at home for about 2 days from most recent d/c   Clinical Impression   Pt presents with decline in function and safety with ADLs and ADL mobility with impaired strength, balance and endurance. Pt ws Ind PTA from last hospital admission 3/27, went home and returned to hospital approximately 2 days later de to weakness, falls and impaired mobility. Pt currently requires min A with LB selfcare and min guard A with mobility using RW. Pt would benefit from acute OT services to address impairments to maximize level of function and safety    Follow Up Recommendations  Home health OT;Supervision/Assistance - 24 hour    Equipment Recommendations  Other (comment);Tub/shower seat (RW)    Recommendations for Other Services       Precautions / Restrictions Precautions Precautions: Fall Precaution Comments: falls more than 10x per month Restrictions Weight Bearing Restrictions: No      Mobility Bed Mobility Overal bed mobility: Needs Assistance Bed Mobility: Supine to Sit;Sit to Supine     Supine to sit: Supervision Sit to supine: Supervision        Transfers Overall transfer level: Needs assistance Equipment used: Rolling Wolf (2 wheeled) Transfers: Sit to/from Stand Sit to Stand: Min guard              Balance Overall balance assessment: Needs  assistance Sitting-balance support: No upper extremity supported;Feet supported Sitting balance-Leahy Scale: Good     Standing balance support: Single extremity supported;During functional activity;Bilateral upper extremity supported Standing balance-Leahy Scale: Poor                             ADL either performed or assessed with clinical judgement   ADL Overall ADL's : Needs assistance/impaired Eating/Feeding: Set up;Independent;Sitting   Grooming: Min guard;Wash/dry face;Wash/dry hands;Oral care;Standing   Upper Body Bathing: Set up;Supervision/ safety;Sitting   Lower Body Bathing: Minimal assistance;Min guard;Sit to/from stand;Cueing for safety   Upper Body Dressing : Set up;Supervision/safety;Sitting   Lower Body Dressing: Minimal assistance;Min guard;Cueing for safety;Sit to/from stand   Toilet Transfer: Min guard;Ambulation;RW;Regular Toilet;Grab bars;Cueing for safety   Toileting- Clothing Manipulation and Hygiene: Min guard;Sit to/from stand       Functional mobility during ADLs: Min guard;Rolling Burggraf;Cueing for safety       Vision Patient Visual Report: No change from baseline       Perception     Praxis      Pertinent Vitals/Pain Pain Assessment: No/denies pain Pain Score: 0-No pain Pain Intervention(s): Monitored during session     Hand Dominance Right   Extremity/Trunk Assessment Upper Extremity Assessment Upper Extremity Assessment: Generalized weakness   Lower Extremity Assessment Lower Extremity Assessment: Defer to PT evaluation   Cervical / Trunk Assessment Cervical / Trunk Assessment: Normal   Communication Communication Communication: No difficulties   Cognition Arousal/Alertness: Awake/alert Behavior During Therapy: Impulsive Overall Cognitive Status: Within Functional Limits for tasks  assessed                                 General Comments: pt reports that fell this morning in bahtroom as he got  up without assist from staff   General Comments       Exercises     Shoulder Instructions      Home Living Family/patient expects to be discharged to:: Private residence Living Arrangements: Spouse/significant other Available Help at Discharge: Family;Available PRN/intermittently Type of Home: House Home Access: Stairs to enter [employer] of Steps: 5 Entrance Stairs-Rails: Can reach both Home Layout: One level     Bathroom Shower/Tub: Tub/shower unit;Walk-in shower   Bathroom Toilet: Standard     Home Equipment: Environmental consultant - 4 wheels;Cane - single point          Prior Functioning/Environment Level of Independence: Needs assistance  Gait / Transfers Assistance Needed: walks with/without cane inside home and with cane outside; most falls occur outside due to uneven terrain ADL's / Homemaking Assistance Needed: pt was independent, but his hobby was drinking.            OT Problem List: Decreased activity tolerance;Impaired balance (sitting and/or standing);Decreased knowledge of use of DME or AE      OT Treatment/Interventions: Self-care/ADL training;Therapeutic exercise;Energy conservation;DME and/or AE instruction;Therapeutic activities;Cognitive remediation/compensation;Patient/family education;Balance training    OT Goals(Current goals can be found in the care plan section) Acute Rehab OT Goals Patient Stated Goal: get better, go home OT Goal Formulation: With patient Time For Goal Achievement: 10/21/20 Potential to Achieve Goals: Good ADL Goals Pt Will Perform Grooming: with supervision;with set-up;standing Pt Will Perform Upper Body Bathing: with set-up;with modified independence Pt Will Perform Lower Body Bathing: with min guard assist;with supervision;with set-up;sitting/lateral leans;sit to/from stand Pt Will Perform Upper Body Dressing: with set-up;with modified independence Pt Will Perform Lower Body Dressing: with min guard assist;with  supervision;with set-up;sitting/lateral leans;sit to/from stand Pt Will Transfer to Toilet: with supervision;with modified independence;ambulating Pt Will Perform Toileting - Clothing Manipulation and hygiene: with supervision;with modified independence;sit to/from stand Pt Will Perform Tub/Shower Transfer: with min guard assist;with supervision;ambulating;shower seat;rolling Osmer  OT Frequency: Min 2X/week   Barriers to D/C:            Co-evaluation              AM-PAC OT "6 Clicks" Daily Activity     Outcome Measure Help from another person eating meals?: None Help from another person taking care of personal grooming?: A Little Help from another person toileting, which includes using toliet, bedpan, or urinal?: A Little Help from another person bathing (including washing, rinsing, drying)?: A Little Help from another person to put on and taking off regular upper body clothing?: A Little Help from another person to put on and taking off regular lower body clothing?: A Little 6 Click Score: 19   End of Session Equipment Utilized During Treatment: Gait belt;Rolling Joyner Nurse Communication: Mobility status  Activity Tolerance: Patient tolerated treatment well Patient left: in bed;with call bell/phone within reach;with bed alarm set;with family/visitor present  OT Visit Diagnosis: Muscle weakness (generalized) (M62.81);Other symptoms and signs involving cognitive function                Time: 1132-1200 OT Time Calculation (min): 28 min Charges:  OT General Charges $OT Visit: 1 Visit OT Evaluation $OT Eval Moderate Complexity: 1 Mod OT Treatments $Self Care/Home Management :  8-22 mins    Emmit Alexanders [facility] 10/07/2020, 1:01 PM

## 2020-10-07 NOTE — Progress Notes (Signed)
I spoke with Mortimer Fries regarding OT recommendations for 24h supervision. He notes that he has several individuals in his support system that have offered to help, including Percell Miller (friend), Junior (cousin), his son, and several neighbors. He notes that these folks can stay with him while his wife is at work during the day.   I explained that I would like to review this plan with his significant other, Hassan Rowan, prior finalizing any plans. He did exprss that he would like to discharge as soon as possible so I told him that I would let him know after speaking with Hassan Rowan.   Mitzi Hansen, MD Internal Medicine Resident PGY-2 [facility] Pager: [phone] 10/07/2020 4:22 PM

## 2020-10-07 NOTE — Progress Notes (Signed)
HD#0 Subjective:   No acute events overnight.  During evaluation this morning, [name] reports he is feeling much better this morning, though notes that he does feel sleepy.  States that he usually sleeps in the daytime and awake at night.  Patient reports has not been taking his gabapentin and only take his Depakote intermittently.  He reports he had a brown BM last night. Reports history of bloody stools and dark stools ~1 year ago. Has never had a colonoscopy.   Patient reports an episode of fall this morning in the bathroom.  States that he use the bathroom on his own and fell and hit his head.  Denies loss of consciousness.   Objective:   Vital signs in last 24 hours: Vitals:   10/06/20 1508 10/06/20 2140 10/07/20 0203 10/07/20 0700  BP: (!) 130/91 103/77 112/76   Pulse: 99 89 89   Resp:  19 20   Temp: 98.1 F (36.7 C) 98.5 F (36.9 C) 97.9 F (36.6 C)   TempSrc: Oral Oral Oral   SpO2: 99% 99% 97%   Weight:    66.5 kg   Supplemental O2: Room Air SpO2: 97 %  Physical Exam Constitutional:      General: He is not in acute distress.    Comments: Energy level improved significantly compared to yesterday  HENT:     Head: Normocephalic.  Eyes:     General:        Right eye: No discharge.        Left eye: No discharge.  Cardiovascular:     Rate and Rhythm: Normal rate and regular rhythm.  Pulmonary:     Effort: Pulmonary effort is normal. No respiratory distress.  Musculoskeletal:     Cervical back: Normal range of motion.     Right lower leg: No edema.     Left lower leg: No edema.  Skin:    General: Skin is warm.  Neurological:     Mental Status: He is alert.     Comments: Extraocular muscles intact Slightly slow reaction doing finger-to-nose test. Noted unsteady gait with walking.  Psychiatric:        Mood and Affect: Mood normal.     Filed Weights   10/07/20 0700  Weight: 66.5 kg     Intake/Output Summary (Last 24 hours) at 10/07/2020  1434 Last data filed at 10/07/2020 0900 Gross per 24 hour  Intake 800 ml  Output 325 ml  Net 475 ml   Net IO Since Admission: 475 mL [10/07/20 1434]  Pertinent Labs: CBC Latest Ref Rng & Units 10/07/2020 10/06/2020 10/02/2020  WBC 4.0 - 10.5 K/uL 6.7 9.0 6.4  Hemoglobin 13.0 - 17.0 g/dL 9.7(L) 12.0(L) 11.9(L)  Hematocrit 39.0 - 52.0 % 29.8(L) 37.5(L) 36.4(L)  Platelets 150 - 400 K/uL 169 215 134(L)    CMP Latest Ref Rng & Units 10/07/2020 10/06/2020 10/02/2020  Glucose 70 - 99 mg/dL 108(H) 100(H) 119(H)  BUN 6 - 20 mg/dL 9 10 11   Creatinine 0.61 - 1.24 mg/dL 0.64 1.07 0.88  Sodium 135 - 145 mmol/L 137 135 132(L)  Potassium 3.5 - 5.1 mmol/L 3.8 4.2 4.0  Chloride 98 - 111 mmol/L 106 99 97(L)  CO2 22 - 32 mmol/L 27 29 26   Calcium 8.9 - 10.3 mg/dL 8.0(L) 9.4 9.0  Total Protein 6.5 - 8.1 g/dL 5.9(L) 7.6 -  Total Bilirubin 0.3 - 1.2 mg/dL 0.8 1.4(H) -  Alkaline Phos 38 - 126 U/L 98 127(H) -  AST 15 - 41 U/L 43(H) 53(H) -  ALT 0 - 44 U/L 31 38 -    Imaging: No results found.  I have reviewed the patient's current medications.  Assessment/Plan:   Active Problems:   Generalized weakness   Patient Summary: [name] is a 55 year old male with past medical history of alcohol use disorder, hypertension, depression who was admitted to the hospital from internal medicine clinic for generalized weakness, likely due to dehydration from poor p.o. intake.  Generalized weakness Dehydration His mental status and energy level significantly improved today.  Patient is more alert, awake and oriented.  Able to hold a conversation appropriately.  His generalized weakness is likely due to dehydration from poor p.o. intake.  Negative orthostatic hypotension. UDS came back positive for benzo, which is likely due to patient getting Librium on last admission.  His tachycardia and hypotension also resolved with fluid.  - Patient would likely need more care at home. OT recommended home OT with 24-hour  supervision.  Will speak with family to see if they are able to provide this level of care. - Continue holding centrally acting medication such as gabapentin and Depakote - CIWA without Ativan - PT/OT - Continue po intake    Impaired balance Patient reports multiple fall at home due to balance issue.  He was noted to have unsteady gait on exam.  Finger-to-nose test showed slower reaction, which can suggest cerebellar dysfunction.  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  - Fall precaution  - Pending PT/OT recs - Advised patient to not ambulate without assistant   Elevated liver enzymes LFT remains at baseline.  Right upper quadrant ultrasound on last admission showed coarse echogenic nodular liver suspect cirrhosis, likely from his alcohol use disorder. Hepatitis B/C test was negative on last admission. - Encourage patient on alcohol cessation - Continue follow outpatient    Alcohol use disorder -Thiamine and folate  Diet: regular Code: full DVT: Lovenox  Please contact the on call pager after 5 pm and on weekends at [phone].  Gaylan Gerold, DO Internal Medicine Residency My pager: [phone]

## 2020-10-07 NOTE — Discharge Summary (Addendum)
Name: [name] MRN: [id number] DOB: May 20, 1966 55 y.o. PCP: [name], MD  Date of Admission: 10/06/2020  2:48 PM Date of Discharge: 10/08/20 Attending Physician: Lucious Groves, DO  Discharge Diagnosis: 1. Dehydration 2. Alcohol use disorder 3. Ataxia 4. Hematoma 5. Compensated Liver cirrhosis 6. Tobacco use disorder 9. Major Depressive Disorder 10. Cerebral vascular disease 11. Hypertension  Discharge Medications: Allergies as of 10/08/2020   No Known Allergies     Medication List    STOP taking these medications   cyclobenzaprine 5 MG tablet Commonly known as: FLEXERIL   divalproex 250 MG DR tablet Commonly known as: DEPAKOTE   ibuprofen 200 MG tablet Commonly known as: ADVIL   ICY HOT EX   lisinopril 10 MG tablet Commonly known as: ZESTRIL   oxymetazoline 0.05 % nasal spray Commonly known as: AFRIN     TAKE these medications   atorvastatin 40 MG tablet Commonly known as: LIPITOR Take 1 tablet (40 mg total) by mouth daily.   DULoxetine 60 MG capsule Commonly known as: CYMBALTA TAKE 1 CAPSULE (60 MG TOTAL) BY MOUTH DAILY.   gabapentin 300 MG capsule Commonly known as: NEURONTIN TAKE 2 CAPSULES (600 MG TOTAL) BY MOUTH 3 (THREE) TIMES DAILY. What changed: how much to take   tetrahydrozoline-zinc 0.05-0.25 % ophthalmic solution Commonly known as: VISINE-AC Place 1 drop into both eyes 3 (three) times daily as needed (dry eyes).   thiamine 100 MG tablet Take 1 tablet (100 mg total) by mouth daily.   Vitamin D3 1.25 MG (50000 UT) Caps Take 1 tablet by mouth once a week.       Disposition and follow-up:   [name] was discharged from [facility] in Stable condition.  At the hospital follow up visit please address:  Dehydration (resolved) Follow up recommendations -re-evaluate hydration status at time of follow up and encourage sufficient hydration  Gait ataxia (wide-based, shuffling). Mild difficulty with  cerebellar tests including finger to nose and rapid alternative movements. No ventriculomegaly or lesions noted on recent brain imaging. I suspect this is mostly related to chronic alcohol use.  Generalized weakness/deconditioning. Likely also due to chronic alcohol use Alcohol use Disorder. Baseline alcohol use is around a half gallon of brady per day. Denied alcohol use since his last discharge on 10/02/20. Medication changes: DISCONTINUE flexeril Follow up recommendations -ambulatory referral placed to PT/OT  -re-evaluate for number of falls -avoid centrally acting medications that would increase fall risk -encourage complete alcohol cessation. Continue thiamine supplementation.  -re-evaluate hematoma on back -avoid NSAID use in the setting of alcohol use disorder  Cerebral microvascular disease. MRI from 09/26/20 showed prior infarcts involving the basal ganglia and surrounding white matter. Lipid panel from 09/27/20 showed total cholesterol 187, HDL 36, LDL 125. Tobacco use disorder Discharge medication: atorvastatin 63m (ordered 10/03/20) Follow up recommendations -mitigate risk factors -encourage smoking cessation. Consider referral to clinical pharmacist for further counseling and assistance    Hx of hypertension. Lisinopril was held this admission due to being normotensive.  Medication changes: DISCONTINUE lisinopril Follow up recommendations -re-evaluate blood pressure at time of follow up. Be mindful of recurrent falls and risk for dehydration from alcohol use prior to resuming antihypertensives.  Macrocytic anemia -repeat CBC at time of follow up  Compensated alcoholic liver cirrhosis -refer to GI for ongoing monitoring -encourage alcohol cessation  Major depressive disorder. Pt reports that he has not been taking depakote.  -Depakote removed from med list at time of discharge -Continue Cymbalta  Labs / imaging needed at time of follow-up: CBC, BMP  Pending labs/ test  needing follow-up: none  Follow-up Appointments: PCP 3-5 days  Follow-up Information    [facility]. Call in 1 day(s).   Why: Please arrange a visit for hospital follow up early next week. Contact information: [address] [place] Hospital Course:  [name] is a 55 year old male with past medical history of alcohol use disorder, hypertension depression who was recently admitted 3/27-10/02/20 for alcohol withdrawal. He presented to the Internal medicine center on 10/06/20 for hospital follow up at which time he was noted to be somnolent with generalized weakness and was subsequently admitted for further monitoring. He has used a cane or [place] for a few years however, since discharge, has fallen several times. He was also noted to have poor oral intake since discharge.  His blood pressures were initially soft but improved with IVF. His alk phos was also mildly elevated at 127, which, along with softer pressures, suggest early onset dehydration.  The remainder of labs including liver enzymes, bilirubin, CBC were unremarkable. UDS was positive for benzodiazepines which was likely from librium during his recent admission. An ethanol level was <10. He did not show significant signs of hepatic encephalopathy and I would consider this unlikely given his quick improvement with simple IV fluids.  On hospital day #1, his mental status and strength has improved dramatically and alk phos had come back down. He was eating well and able to hold a complete conversation. On exam, he had a wide-based gait with shuffling that was somewhat unsteady but the remainder of the neurologic exam was fairly unremarkable. Cerebellar testing, including finger-to-nose and rapid alternating movements, was slower than what I would consider "normal". There was no resting tremor to suggest Parkinson's disease. He recently underwent a brain MRI on  3/27 and then head CT on 3/29. No ventricular enlargement or acute lesions were noted, although there were chronic infarcts involving the right basal ganglia and adjacent white matter indicative of small vessel disease.  He was elevated by OT on hospital day #1, who felt he could return home with home health and 24h supervision safely. This was discussed with Dartanyan and his significant other who noted several individuals who were available to watch him while his significant other was at work. His mental status had returned to baseline and strength was improved on hospital day #2 and he was subsequently discharged with home health.  Impression:  I suspect that his presenting symptoms were due to dehydration in the setting of poor oral intake since discharge. This symptoms were resolved at discharge however will reoccur without adequate intake. Unfortunately, he does remain high risk for re-admission due to ongoing alcohol and other substance use disorders in addition to having an unsteady gait. He is high risk for falling without proper supervision.    Discharge subjective: Feeling well this morning. No complaints. Notes that he was able to ambulate to the bathroom and shower without difficulty this morning. We reiterated our recommendations that he have 24 hour supervision if discharged home which he was agreeable to. I spoke with his fiance who also felt that this could be done.  Discharge Vitals:   BP 116/77 (BP Location: Left Arm)   Pulse 98   Temp 98.2 F (36.8 C) (Oral)   Resp 18   Wt 65.3 kg  SpO2 96%   BMI 22.55 kg/m    Physical exam General: sitting up on the edge of bed, well appearing Cardiac: RRR Pulm: breathing comfortably on room air, lungs clear MSK: alert and oriented x4. 5/5 strength in upper and lower extremities.   Pertinent Labs, Studies, and Procedures:  CBC Latest Ref Rng & Units 10/08/2020 10/07/2020 10/06/2020  WBC 4.0 - 10.5 K/uL 8.1 6.7 9.0  Hemoglobin 13.0 - 17.0 g/dL  11.2(L) 9.7(L) 12.0(L)  Hematocrit 39.0 - 52.0 % 34.2(L) 29.8(L) 37.5(L)  Platelets 150 - 400 K/uL 190 169 215   CMP Latest Ref Rng & Units 10/07/2020 10/06/2020 10/02/2020  Glucose 70 - 99 mg/dL 108(H) 100(H) 119(H)  BUN 6 - 20 mg/dL '9 10 11  ' Creatinine 0.61 - 1.24 mg/dL 0.64 1.07 0.88  Sodium 135 - 145 mmol/L 137 135 132(L)  Potassium 3.5 - 5.1 mmol/L 3.8 4.2 4.0  Chloride 98 - 111 mmol/L 106 99 97(L)  CO2 22 - 32 mmol/L '27 29 26  ' Calcium 8.9 - 10.3 mg/dL 8.0(L) 9.4 9.0  Total Protein 6.5 - 8.1 g/dL 5.9(L) 7.6 -  Total Bilirubin 0.3 - 1.2 mg/dL 0.8 1.4(H) -  Alkaline Phos 38 - 126 U/L 98 127(H) -  AST 15 - 41 U/L 43(H) 53(H) -  ALT 0 - 44 U/L 31 38 -    Signed:  Mitzi Hansen, MD Internal Medicine Resident PGY-2 [facility] Pager: [phone] 10/08/2020 2:02 PM
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
## 2020-10-07 NOTE — Progress Notes (Signed)
Internal Medicine Clinic Attending  I saw and evaluated the patient.  I personally confirmed the key portions of the history and exam documented by Dr. Truman Hayward and I reviewed pertinent patient test results.  The assessment, diagnosis, and plan were formulated together and I agree with the documentation in the resident's note.  Lenice Pressman, M.D., Ph.D.

## 2020-10-08 DIAGNOSIS — R531 Weakness: Secondary | ICD-10-CM | POA: Diagnosis not present

## 2020-10-08 DIAGNOSIS — R260 Ataxic gait: Secondary | ICD-10-CM | POA: Diagnosis not present

## 2020-10-08 DIAGNOSIS — F1020 Alcohol dependence, uncomplicated: Secondary | ICD-10-CM

## 2020-10-08 DIAGNOSIS — D539 Nutritional anemia, unspecified: Secondary | ICD-10-CM

## 2020-10-08 DIAGNOSIS — E860 Dehydration: Secondary | ICD-10-CM | POA: Diagnosis not present

## 2020-10-08 DIAGNOSIS — F329 Major depressive disorder, single episode, unspecified: Secondary | ICD-10-CM

## 2020-10-08 DIAGNOSIS — K7030 Alcoholic cirrhosis of liver without ascites: Secondary | ICD-10-CM

## 2020-10-08 DIAGNOSIS — I679 Cerebrovascular disease, unspecified: Secondary | ICD-10-CM

## 2020-10-08 DIAGNOSIS — I10 Essential (primary) hypertension: Secondary | ICD-10-CM

## 2020-10-08 DIAGNOSIS — Z720 Tobacco use: Secondary | ICD-10-CM

## 2020-10-08 LAB — CBC
HCT: 34.2 % — ABNORMAL LOW (ref 39.0–52.0)
Hemoglobin: 11.2 g/dL — ABNORMAL LOW (ref 13.0–17.0)
MCH: 33.9 pg (ref 26.0–34.0)
MCHC: 32.7 g/dL (ref 30.0–36.0)
MCV: 103.6 fL — ABNORMAL HIGH (ref 80.0–100.0)
Platelets: 190 10*3/uL (ref 150–400)
RBC: 3.3 MIL/uL — ABNORMAL LOW (ref 4.22–5.81)
RDW: 13.1 % (ref 11.5–15.5)
WBC: 8.1 10*3/uL (ref 4.0–10.5)
nRBC: 0 % (ref 0.0–0.2)

## 2020-10-08 LAB — GLUCOSE, CAPILLARY: Glucose-Capillary: 101 mg/dL — ABNORMAL HIGH (ref 70–99)

## 2020-10-08 NOTE — Progress Notes (Signed)
I was able to reach out to pt's fiance, Hassan Rowan, to discuss discharge plans. I explained the OT is currently recommending 24h supervision if he were to be discharged home. She did feel that these needs could be met, through the assistance of her mother, pt's family and herself. I explained that I would like for him to follow up in our clinic which she was agreeable to.  She expressed that she feels that he is ready for discharge today and will be able to provide transport home after completing work later today (~430PM)  Mitzi Hansen, MD Internal Medicine Resident PGY-2 [facility] Pager: [phone] 10/08/2020 1:53 PM

## 2020-10-08 NOTE — TOC CAGE-AID Note (Signed)
Transition of Care [facility]) - CAGE-AID Screening   Patient Details  Name: [name] MRN: [id number] Date of Birth: December 03, 1965  Transition of Care [facility]) CM/SW Contact:    Emeterio Reeve, [place] Phone Number: 10/08/2020, 4:53 PM   Clinical Narrative: Pt reports daily alcohol use. Pt states he drinks a gallon of liquor a week. Pt reports he knows he needs to stop and plans on stopping. Pt reports his neighbor is apart of an IOP program and he is going to call Monday to sign up. CSW gave pt additional community resources.    CAGE-AID Screening:    Have You Ever Felt You Ought to Cut Down on Your Drinking or Drug Use?: Yes Have People Annoyed You By Critizing Your Drinking Or Drug Use?: Yes Have You Felt Bad Or Guilty About Your Drinking Or Drug Use?: Yes Have You Ever Had a Drink or Used Drugs First Thing In The Morning to Steady Your Nerves or to Get Rid of a Hangover?: No CAGE-AID Score: 3  Substance Abuse Education Offered: Yes  Substance abuse interventions: Patient Counseling,Educational Materials   Emeterio Reeve, Latanya Presser, [place] Social Worker [phone]

## 2020-10-08 NOTE — TOC Initial Note (Signed)
Transition of Care [facility]) - Initial/Assessment Note    Patient Details  Name: [name] MRN: [id number] Date of Birth: 1966-03-17  Transition of Care [facility]) CM/SW Contact:    Marilu Favre, RN Phone Number: 10/08/2020, 2:31 PM  Clinical Narrative:                 Orders for home health . Confirmed face sheet information with patient.   NCM sent referral to following agencies:   [facility], unable to accept due to staffing.   [facility]  unable to accept due to staffing.  [facility]  unable to accept due to staffing.  [facility]  unable to accept due to staffing.  [facility]  unable to accept due to staffing.  [facility]  unable to accept due to staffing.  [facility]  unable to accept due to staffing.  [facility]  unable to accept due to staffing.  [facility]  unable to accept due to staffing.  [facility]  unable to accept due to staffing.   NCM has exhausted home health agency  list for patient's address.    Discussed OP PT with patient. Patient has been to the location on [address] in past and prefers that location.   Messaged MD for order   Expected Discharge Plan: [place] Barriers to Discharge: No Barriers Identified   Patient Goals and CMS Choice Patient states their goals for this hospitalization and ongoing recovery are:: to return tohome CMS Medicare.gov Compare Post Acute Care list provided to:: Patient Choice offered to / list presented to : Patient  Expected Discharge Plan and Services Expected Discharge Plan: [place]   Discharge Planning Services: CM Consult Post Acute Care Choice: [place] arrangements for the past 2 months: Single Family Home Expected Discharge Date: 10/08/20                 DME Agency: NA                  Prior Living Arrangements/Services Living arrangements for the past 2 months: Single Family Home Lives with:: Significant  Other Patient language and need for interpreter reviewed:: Yes Do you feel safe going back to the place where you live?: Yes      Need for Family Participation in Patient Care: Yes (Comment) Care giver support system in place?: Yes (comment)   Criminal Activity/Legal Involvement Pertinent to Current Situation/Hospitalization: No - Comment as needed  Activities of Daily Living      Permission Sought/Granted   Permission granted to share information with : No              Emotional Assessment Appearance:: Appears stated age Attitude/Demeanor/Rapport: Engaged Affect (typically observed): Accepting Orientation: : Oriented to Self,Oriented to Place,Oriented to  Time,Oriented to Situation Alcohol / Substance Use: Not Applicable Psych Involvement: No (comment)  Admission diagnosis:  Generalized weakness [R53.1] Patient Active Problem List   Diagnosis Date Noted  . Tachycardia 10/06/2020  . Generalized weakness 10/06/2020  . Syncope 09/27/2020  . Alcohol withdrawal ([place]) 09/26/2020  . Vitamin D deficiency 07/06/2020  . Right hip pain 07/02/2020  . Alcohol abuse 02/13/2020  . Thrombocytopenia ([place]) 02/13/2020  . Elevated serum GGT level 11/06/2019  . Paresthesia 05/15/2019  . Pain in both lower extremities 05/15/2019  . Functional incontinence 04/14/2019  . Gait abnormality 04/14/2019  . Leg cramps 04/14/2019  . 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 Anemia 03/14/2018  . Left leg pain  01/21/2017  . Essential hypertension 01/21/2017  . MDD (major depressive disorder) 03/25/2010  . ERECTILE DYSFUNCTION 05/14/2008  . GERD 04/13/2008  . TOBACCO ABUSE 03/11/2008   PCP:  [name], MD Pharmacy:   [facility], [address] [place] [address] Phone: [phone] Fax: [phone]     Social Determinants of Health (SDOH) Interventions    Readmission Risk Interventions No flowsheet data found.
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
## 2020-10-11 ENCOUNTER — Telehealth: Payer: Self-pay | Admitting: Student

## 2020-10-11 NOTE — Telephone Encounter (Signed)
TOC HFU [facility] FOR 10/18/2020 @ 2:15 PM WITH DR. Truman Hayward.

## 2020-10-18 ENCOUNTER — Encounter: Payer: Medicaid Other | Admitting: Internal Medicine

## 2020-10-20 ENCOUNTER — Ambulatory Visit: Payer: Medicaid Other | Admitting: Behavioral Health

## 2020-10-20 ENCOUNTER — Other Ambulatory Visit: Payer: Self-pay

## 2020-10-20 DIAGNOSIS — F419 Anxiety disorder, unspecified: Secondary | ICD-10-CM

## 2020-10-20 DIAGNOSIS — F331 Major depressive disorder, recurrent, moderate: Secondary | ICD-10-CM

## 2020-10-20 NOTE — BH Specialist Note (Signed)
Integrated Behavioral Health via Telemedicine Visit  10/20/2020 [name] [id number]  Number of [place] visits: 5/6 Session Start time: 1:00pm  Session End time: 1:30pm Total time: 30  Referring Provider: Dr. Morrison Old, MD Patient/Family location: Pt walking back from [employer] bus stop confusion [facility] Provider location: Working remotely in private All persons participating in visit: Pt & Clinician Types of Service: Individual psychotherapy  I connected with [name] and/or [name] self via  Telephone or Geologist, engineering  (Video is Tree surgeon) and verified that I am speaking with the correct person using two identifiers. Discussed confidentiality: Yes   I discussed the limitations of telemedicine and the availability of in person appointments.  Discussed there is a possibility of technology failure and discussed alternative modes of communication if that failure occurs.  I discussed that engaging in this telemedicine visit, they consent to the provision of behavioral healthcare and the services will be billed under their insurance.  Patient and/or legal guardian expressed understanding and consented to Telemedicine visit: Yes   Presenting Concerns: Patient and/or family reports the following symptoms/concerns: recent hospitalization for fall; possible stroke Duration of problem: several wks now; Severity of problem: moderate  Patient and/or Family's Strengths/Protective Factors: Social connections, Social and Emotional competence, Concrete supports in place (healthy food, safe environments, etc.) and Sense of purpose  Goals Addressed: Patient will: 1.  Reduce symptoms of: anxiety, depression and stress  2.  Increase knowledge and/or ability of: coping skills and healthy habits  3.  Demonstrate ability to: Increase healthy adjustment to current life circumstances and inc self-care practices to promote health &  well-being  Progress towards Goals: Ongoing  Interventions: Interventions utilized:  Supportive Counseling Standardized Assessments completed: Not Needed  Patient and/or Family Response: Pt receptive to call today & wishes to cont check-in calls to promote health & wellness  Assessment: Patient currently experiencing inc'd confidence since recent fall/hosp'ztn for possible stroke incident. Pt pursuing testing for further evidence of stroke or other health-related incident prior to fall  Patient may benefit from cont'd check-ins to monitor progress w/drinking & health habits.  Plan: 1. Follow up with behavioral health clinician on : early May for 30 min check-in via telehealth 2. Behavioral recommendations: cont self-care, conservative steps post possible CVA & inc'd awareness of self-care practices that promote optimal wellness. 3. Referral(s): [place] (In Clinic)  I discussed the assessment and treatment plan with the patient and/or parent/guardian. They were provided an opportunity to ask questions and all were answered. They agreed with the plan and demonstrated an understanding of the instructions.   They were advised to call back or seek an in-person evaluation if the symptoms worsen or if the condition fails to improve as anticipated.  Donnetta Hutching, LMFT

## 2020-10-25 ENCOUNTER — Other Ambulatory Visit (HOSPITAL_COMMUNITY): Payer: Self-pay

## 2020-10-25 ENCOUNTER — Other Ambulatory Visit: Payer: Self-pay

## 2020-10-25 ENCOUNTER — Encounter: Payer: Self-pay | Admitting: Internal Medicine

## 2020-10-25 ENCOUNTER — Ambulatory Visit (INDEPENDENT_AMBULATORY_CARE_PROVIDER_SITE_OTHER): Payer: Medicaid Other | Admitting: Internal Medicine

## 2020-10-25 VITALS — BP 143/80 | HR 87 | Temp 98.6°F | Ht 67.5 in | Wt 149.8 lb

## 2020-10-25 DIAGNOSIS — F3342 Major depressive disorder, recurrent, in full remission: Secondary | ICD-10-CM

## 2020-10-25 DIAGNOSIS — F1010 Alcohol abuse, uncomplicated: Secondary | ICD-10-CM

## 2020-10-25 DIAGNOSIS — R531 Weakness: Secondary | ICD-10-CM

## 2020-10-25 DIAGNOSIS — I10 Essential (primary) hypertension: Secondary | ICD-10-CM

## 2020-10-25 DIAGNOSIS — D649 Anemia, unspecified: Secondary | ICD-10-CM

## 2020-10-25 DIAGNOSIS — F10230 Alcohol dependence with withdrawal, uncomplicated: Secondary | ICD-10-CM | POA: Diagnosis not present

## 2020-10-25 DIAGNOSIS — F10930 Alcohol use, unspecified with withdrawal, uncomplicated: Secondary | ICD-10-CM

## 2020-10-25 DIAGNOSIS — E559 Vitamin D deficiency, unspecified: Secondary | ICD-10-CM | POA: Diagnosis not present

## 2020-10-25 DIAGNOSIS — M79605 Pain in left leg: Secondary | ICD-10-CM | POA: Diagnosis not present

## 2020-10-25 MED ORDER — CYCLOBENZAPRINE HCL 5 MG PO TABS
5.0000 mg | ORAL_TABLET | Freq: Three times a day (TID) | ORAL | 0 refills | Status: DC | PRN
  Filled 2020-10-25: qty 60, 20d supply, fill #0

## 2020-10-25 MED ORDER — ERGOCALCIFEROL 1.25 MG (50000 UT) PO CAPS
50000.0000 [IU] | ORAL_CAPSULE | ORAL | 0 refills | Status: DC
  Filled 2020-10-25: qty 12, 84d supply, fill #0

## 2020-10-25 MED ORDER — ATORVASTATIN CALCIUM 40 MG PO TABS
40.0000 mg | ORAL_TABLET | Freq: Every day | ORAL | 2 refills | Status: DC
  Filled 2020-10-25: qty 30, 30d supply, fill #0

## 2020-10-25 MED ORDER — THIAMINE HCL 100 MG PO TABS
100.0000 mg | ORAL_TABLET | Freq: Every day | ORAL | 2 refills | Status: DC
  Filled 2020-10-25: qty 30, 30d supply, fill #0

## 2020-10-25 MED ORDER — VITAMIN D2 50 MCG (2000 UT) PO TABS
1.0000 | ORAL_TABLET | Freq: Every day | ORAL | 2 refills | Status: DC
Start: 2020-10-25 — End: 2020-10-25
  Filled 2020-10-25: qty 90, fill #0

## 2020-10-25 MED ORDER — DULOXETINE HCL 60 MG PO CPEP
60.0000 mg | ORAL_CAPSULE | Freq: Every day | ORAL | 1 refills | Status: DC
  Filled 2020-10-25: qty 90, 90d supply, fill #0

## 2020-10-25 MED ORDER — GABAPENTIN 300 MG PO CAPS
600.0000 mg | ORAL_CAPSULE | Freq: Three times a day (TID) | ORAL | 3 refills | Status: DC
  Filled 2020-10-25: qty 90, 15d supply, fill #0

## 2020-10-25 MED ORDER — CHOLECALCIFEROL 1.25 MG (50000 UT) PO CAPS
50000.0000 [IU] | ORAL_CAPSULE | ORAL | 0 refills | Status: DC
Start: 2020-10-25 — End: 2020-10-25
  Filled 2020-10-25: qty 4, 28d supply, fill #0

## 2020-10-25 NOTE — Assessment & Plan Note (Signed)
Previously diagnosed 3 months prior with vitamin D <4. Had string of readmissions and lack of medication non-adherence per family. Recovered from readmission. Advised to restart oral repletion. Can get Vit D level re-checked at next visit.  - Restart vitamin D2 50K U weekly for 12 weeks - Vitamin D level at next visit

## 2020-10-25 NOTE — Patient Instructions (Signed)
Thank you for allowing [place] to provide your care today. Today we discussed your leg pain    I have ordered labs labs for you. I will call if any are abnormal.    Today we made the following changes to your medications.    Please start flexeril 5mg  Tid for 3 weeks  Please follow-up in 3 months.    Should you have any questions or concerns please call the internal medicine clinic at [phone].      Muscle Cramps and Spasms Muscle cramps and spasms are when muscles tighten by themselves. They usually get better within minutes. Muscle cramps are painful. They are usually stronger and last longer than muscle spasms. Muscle spasms may or may not be painful. They can last a few seconds or much longer. Cramps and spasms can affect any muscle, but they occur most often in the calf muscles of the leg. They are usually not caused by a serious problem. In many cases, the cause is not known. Some common causes include:  Doing more physical work or exercise than your body is ready for.  Using the muscles too much (overuse) by repeating certain movements too many times.  Staying in a certain position for a long time.  Playing a sport or doing an activity without preparing properly.  Using bad form or technique while playing a sport or doing an activity.  Not having enough water in your body (dehydration).  Injury.  Side effects of some medicines.  Low levels of the salts and minerals in your blood (electrolytes), such as low potassium or calcium. Follow these instructions at home: Managing pain and stiffness  Massage, stretch, and relax the muscle. Do this for many minutes at a time.  If told, put heat on tight or tense muscles as often as told by your doctor. Use the heat source that your doctor recommends, such as a moist heat pack or a heating pad. ? Place a towel between your skin and the heat source. ? Leave the heat on for 20-30 minutes. ? Remove the heat if your skin turns bright  red. This is very important if you are not able to feel pain, heat, or cold. You may have a greater risk of getting burned.  If told, put ice on the affected area. This may help if you are sore or have pain after a cramp or spasm. ? Put ice in a plastic bag. ? Place a towel between your skin and the bag. ? Leave the ice on for 20 minutes, 2-3 times a day.  Try taking hot showers or baths to help relax tight muscles.      Eating and drinking  Drink enough fluid to keep your pee (urine) pale yellow.  Eat a healthy diet to help ensure that your muscles work well. This should include: ? Fruits and vegetables. ? Lean protein. ? Whole grains. ? Low-fat or nonfat dairy products. General instructions  If you are having cramps often, avoid intense exercise for several days.  Take over-the-counter and prescription medicines only as told by your doctor.  Watch for any changes in your symptoms.  Keep all follow-up visits as told by your doctor. This is important. Contact a doctor if:  Your cramps or spasms get worse or happen more often.  Your cramps or spasms do not get better with time. Summary  Muscle cramps and spasms are when muscles tighten by themselves. They usually get better within minutes.  Cramps and spasms occur most  often in the calf muscles of the leg.  Massage, stretch, and relax the muscle. This may help the cramp or spasm go away.  Drink enough fluid to keep your pee (urine) pale yellow. This information is not intended to replace advice given to you by your health care provider. Make sure you discuss any questions you have with your health care provider. Document Revised: 11/12/2017 Document Reviewed: 11/12/2017 Elsevier Patient Education  [employer].
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
## 2020-10-25 NOTE — Assessment & Plan Note (Signed)
CBC Latest Ref Rng & Units 10/08/2020 10/07/2020 10/06/2020  WBC 4.0 - 10.5 K/uL 8.1 6.7 9.0  Hemoglobin 13.0 - 17.0 g/dL 11.2(L) 9.7(L) 12.0(L)  Hematocrit 39.0 - 52.0 % 34.2(L) 29.8(L) 37.5(L)  Platelets 150 - 400 K/uL 190 169 215   Noted on recent admission. Thought to be due to heavy alcohol use. Also has hx of donating blood and plasma twice weekly to supplement income. Currently denies excessive fatigue or weakness. No pallor on exam  - Check cbc

## 2020-10-25 NOTE — Assessment & Plan Note (Signed)
BP Readings from Last 3 Encounters:  10/25/20 (!) 143/80  10/08/20 116/77  10/06/20 119/89   Previously on lisinopril 10mg  but discontinued due to episode of hypotension during recent admission for dehydration. Currently above goal. With tenuous clinical status with recent admissions, will observe for now  - Monitor

## 2020-10-25 NOTE — Assessment & Plan Note (Signed)
History of alcohol abuse. Used to drink 1/2 gallon of brady daily. Had readmissions recently due to alcohol withdrawal and dehydration. Currently drinking 48oz of beer daily. Discussed risks of chronic alcohol use and benefit of alcohol cessation. Also discussed risk of complications from alcohol withdrawal. [name] expressed understanding. Currently not willing to pursue cessation at this time.  A/P Hx of alcohol abuse. C/w to drink. Not willing to quit - C/w advised on alcohol cessation
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
## 2020-10-26 LAB — CBC
Hematocrit: 41.8 % (ref 37.5–51.0)
Hemoglobin: 13.3 g/dL (ref 13.0–17.7)
MCH: 32.5 pg (ref 26.6–33.0)
MCHC: 31.8 g/dL (ref 31.5–35.7)
MCV: 102 fL — ABNORMAL HIGH (ref 79–97)
Platelets: 105 10*3/uL — ABNORMAL LOW (ref 150–450)
RBC: 4.09 x10E6/uL — ABNORMAL LOW (ref 4.14–5.80)
RDW: 14.2 % (ref 11.6–15.4)
WBC: 5.1 10*3/uL (ref 3.4–10.8)

## 2020-10-26 LAB — BMP8+ANION GAP
Anion Gap: 17 mmol/L (ref 10.0–18.0)
BUN/Creatinine Ratio: 13 (ref 9–20)
BUN: 11 mg/dL (ref 6–24)
CO2: 25 mmol/L (ref 20–29)
Calcium: 9.4 mg/dL (ref 8.7–10.2)
Chloride: 97 mmol/L (ref 96–106)
Creatinine, Ser: 0.85 mg/dL (ref 0.76–1.27)
Glucose: 91 mg/dL (ref 65–99)
Potassium: 4.3 mmol/L (ref 3.5–5.2)
Sodium: 139 mmol/L (ref 134–144)
eGFR: 102 mL/min/{1.73_m2} (ref >59)

## 2020-10-27 NOTE — Progress Notes (Signed)
Internal Medicine Clinic Attending  Case discussed with Dr. Lee  At the time of the visit.  We reviewed the resident's history and exam and pertinent patient test results.  I agree with the assessment, diagnosis, and plan of care documented in the resident's note.    

## 2020-11-01 ENCOUNTER — Ambulatory Visit: Payer: Medicaid Other | Admitting: Physical Therapy

## 2020-11-02 ENCOUNTER — Other Ambulatory Visit (HOSPITAL_COMMUNITY): Payer: Self-pay

## 2020-11-04 ENCOUNTER — Telehealth: Payer: Self-pay | Admitting: Student

## 2020-11-04 NOTE — Telephone Encounter (Signed)
   Thorin Starner DOB: 02/26/1966 MRN: [id number]   RIDER WAIVER AND RELEASE OF LIABILITY  For purposes of improving physical access to our facilities, [facility] is pleased to partner with third parties to provide [facility] patients or other authorized individuals the option of convenient, on-demand ground transportation services (the [place]") through use of the technology service that enables users to request on-demand ground transportation from independent third-party providers.  By opting to use and accept these [employer], I, the undersigned, hereby agree on behalf of myself, and on behalf of any minor child using the [employer] for whom I am the parent or legal guardian, as follows:  1. Government social research officer provided to me are provided by independent third-party transportation providers who are not [employer] or employees and who are unaffiliated with [employer]. 2. [facility] is neither a transportation carrier nor a common or public carrier. 3. [facility] has no control over the quality or safety of the transportation that occurs as a result of the [employer]. 4. [facility] cannot guarantee that any third-party transportation provider will complete any arranged transportation service. 5. [facility] makes no representation, warranty, or guarantee regarding the reliability, timeliness, quality, safety, suitability, or availability of any of the Transport Services or that they will be error free. 6. I fully understand that traveling by vehicle involves risks and dangers of serious bodily injury, including permanent disability, paralysis, and death. I agree, on behalf of myself and on behalf of any minor child using the Transport Services for whom I am the parent or legal guardian, that the entire risk arising out of my use of the [employer] remains solely with me, to the maximum extent permitted under applicable law. 7. The [employer] are provided "as is" and "as available." [facility] disclaims all representations and warranties, express, implied or statutory, not expressly set out in these terms, including the implied warranties of merchantability and fitness for a particular purpose. 8. I hereby waive and release [facility], its agents, employees, officers, directors, representatives, insurers, attorneys, assigns, successors, subsidiaries, and affiliates from any and all past, present, or future claims, demands, liabilities, actions, causes of action, or suits of any kind directly or indirectly arising from acceptance and use of the [employer]. 9. I further waive and release [facility] and its affiliates from all present and future liability and responsibility for any injury or death to persons or damages to property caused by or related to the use of the [employer]. 10. I have read this Waiver and Release of Liability, and I understand the terms used in it and their legal significance. This Waiver is freely and voluntarily given with the understanding that my right (as well as the right of any minor child for whom I am the parent or legal guardian using the [employer]) to legal recourse against [facility] in connection with the [employer] is knowingly surrendered in return for use of these services.   I attest that I read the consent document to [name], gave Mr. Urenda the opportunity to ask questions and answered the questions asked (if any). I affirm that [name] then provided consent for he's participation in this program.     Legrand Pitts

## 2020-11-08 ENCOUNTER — Inpatient Hospital Stay (HOSPITAL_COMMUNITY): Payer: Medicaid Other

## 2020-11-08 ENCOUNTER — Inpatient Hospital Stay (HOSPITAL_COMMUNITY)
Admission: EM | Admit: 2020-11-08 | Discharge: 2020-11-22 | DRG: 896 | Disposition: A | Discharge status: Skilled Nursing Facility | Payer: Medicaid Other | Attending: Internal Medicine | Admitting: Internal Medicine

## 2020-11-08 ENCOUNTER — Emergency Department (HOSPITAL_COMMUNITY): Payer: Medicaid Other

## 2020-11-08 ENCOUNTER — Ambulatory Visit: Payer: Medicaid Other | Admitting: Physical Therapy

## 2020-11-08 ENCOUNTER — Other Ambulatory Visit: Payer: Self-pay

## 2020-11-08 DIAGNOSIS — F10230 Alcohol dependence with withdrawal, uncomplicated: Secondary | ICD-10-CM

## 2020-11-08 DIAGNOSIS — F32A Depression, unspecified: Secondary | ICD-10-CM | POA: Diagnosis present

## 2020-11-08 DIAGNOSIS — I10 Essential (primary) hypertension: Secondary | ICD-10-CM | POA: Diagnosis present

## 2020-11-08 DIAGNOSIS — F10939 Alcohol use, unspecified with withdrawal, unspecified: Secondary | ICD-10-CM | POA: Diagnosis present

## 2020-11-08 DIAGNOSIS — R471 Dysarthria and anarthria: Secondary | ICD-10-CM | POA: Diagnosis present

## 2020-11-08 DIAGNOSIS — Z23 Encounter for immunization: Secondary | ICD-10-CM | POA: Diagnosis not present

## 2020-11-08 DIAGNOSIS — I248 Other forms of acute ischemic heart disease: Secondary | ICD-10-CM | POA: Diagnosis present

## 2020-11-08 DIAGNOSIS — R042 Hemoptysis: Secondary | ICD-10-CM | POA: Diagnosis present

## 2020-11-08 DIAGNOSIS — R140 Abdominal distension (gaseous): Secondary | ICD-10-CM

## 2020-11-08 DIAGNOSIS — E860 Dehydration: Secondary | ICD-10-CM | POA: Diagnosis present

## 2020-11-08 DIAGNOSIS — F10239 Alcohol dependence with withdrawal, unspecified: Secondary | ICD-10-CM | POA: Diagnosis present

## 2020-11-08 DIAGNOSIS — F17210 Nicotine dependence, cigarettes, uncomplicated: Secondary | ICD-10-CM | POA: Diagnosis present

## 2020-11-08 DIAGNOSIS — K7031 Alcoholic cirrhosis of liver with ascites: Secondary | ICD-10-CM | POA: Diagnosis present

## 2020-11-08 DIAGNOSIS — I471 Supraventricular tachycardia: Secondary | ICD-10-CM | POA: Diagnosis present

## 2020-11-08 DIAGNOSIS — R000 Tachycardia, unspecified: Secondary | ICD-10-CM | POA: Diagnosis present

## 2020-11-08 DIAGNOSIS — J189 Pneumonia, unspecified organism: Secondary | ICD-10-CM | POA: Diagnosis not present

## 2020-11-08 DIAGNOSIS — K2970 Gastritis, unspecified, without bleeding: Secondary | ICD-10-CM | POA: Diagnosis present

## 2020-11-08 DIAGNOSIS — J690 Pneumonitis due to inhalation of food and vomit: Secondary | ICD-10-CM | POA: Diagnosis present

## 2020-11-08 DIAGNOSIS — Z8673 Personal history of transient ischemic attack (TIA), and cerebral infarction without residual deficits: Secondary | ICD-10-CM | POA: Diagnosis not present

## 2020-11-08 DIAGNOSIS — Z20822 Contact with and (suspected) exposure to covid-19: Secondary | ICD-10-CM | POA: Diagnosis present

## 2020-11-08 LAB — CBC WITH DIFFERENTIAL/PLATELET
Abs Immature Granulocytes: 0.12 10*3/uL — ABNORMAL HIGH (ref 0.00–0.07)
Basophils Absolute: 0.1 10*3/uL (ref 0.0–0.1)
Basophils Relative: 0 %
Eosinophils Absolute: 0 10*3/uL (ref 0.0–0.5)
Eosinophils Relative: 0 %
HCT: 42.8 % (ref 39.0–52.0)
Hemoglobin: 14.1 g/dL (ref 13.0–17.0)
Immature Granulocytes: 1 %
Lymphocytes Relative: 5 %
Lymphs Abs: 0.7 10*3/uL (ref 0.7–4.0)
MCH: 33.6 pg (ref 26.0–34.0)
MCHC: 32.9 g/dL (ref 30.0–36.0)
MCV: 101.9 fL — ABNORMAL HIGH (ref 80.0–100.0)
Monocytes Absolute: 2.4 10*3/uL — ABNORMAL HIGH (ref 0.1–1.0)
Monocytes Relative: 17 %
Neutro Abs: 10.8 10*3/uL — ABNORMAL HIGH (ref 1.7–7.7)
Neutrophils Relative %: 77 %
Platelets: UNDETERMINED 10*3/uL (ref 150–400)
RBC: 4.2 MIL/uL — ABNORMAL LOW (ref 4.22–5.81)
RDW: 14.9 % (ref 11.5–15.5)
WBC: 14.1 10*3/uL — ABNORMAL HIGH (ref 4.0–10.5)
nRBC: 0 % (ref 0.0–0.2)

## 2020-11-08 LAB — COMPREHENSIVE METABOLIC PANEL
ALT: 16 U/L (ref 0–44)
AST: 37 U/L (ref 15–41)
Albumin: 2.9 g/dL — ABNORMAL LOW (ref 3.5–5.0)
Alkaline Phosphatase: 159 U/L — ABNORMAL HIGH (ref 38–126)
Anion gap: 12 (ref 5–15)
BUN: 9 mg/dL (ref 6–20)
CO2: 25 mmol/L (ref 22–32)
Calcium: 8.8 mg/dL — ABNORMAL LOW (ref 8.9–10.3)
Chloride: 96 mmol/L — ABNORMAL LOW (ref 98–111)
Creatinine, Ser: 0.84 mg/dL (ref 0.61–1.24)
GFR, Estimated: >60 mL/min (ref >60)
Glucose, Bld: 144 mg/dL — ABNORMAL HIGH (ref 70–99)
Potassium: 3.5 mmol/L (ref 3.5–5.1)
Sodium: 133 mmol/L — ABNORMAL LOW (ref 135–145)
Total Bilirubin: 2.6 mg/dL — ABNORMAL HIGH (ref 0.3–1.2)
Total Protein: 7.7 g/dL (ref 6.5–8.1)

## 2020-11-08 LAB — TROPONIN I (HIGH SENSITIVITY)
Troponin I (High Sensitivity): 39 ng/L — ABNORMAL HIGH (ref <18)
Troponin I (High Sensitivity): 35 ng/L — ABNORMAL HIGH (ref <18)

## 2020-11-08 LAB — BILIRUBIN, FRACTIONATED(TOT/DIR/INDIR)
Bilirubin, Direct: 0.8 mg/dL — ABNORMAL HIGH (ref 0.0–0.2)
Indirect Bilirubin: 1.3 mg/dL — ABNORMAL HIGH (ref 0.3–0.9)
Total Bilirubin: 2.1 mg/dL — ABNORMAL HIGH (ref 0.3–1.2)

## 2020-11-08 LAB — LIPASE, BLOOD: Lipase: 24 U/L (ref 11–51)

## 2020-11-08 LAB — TSH: TSH: 2.285 u[IU]/mL (ref 0.350–4.500)

## 2020-11-08 LAB — RESP PANEL BY RT-PCR (FLU A&B, COVID) ARPGX2
Influenza A by PCR: NEGATIVE
Influenza B by PCR: NEGATIVE
SARS Coronavirus 2 by RT PCR: NEGATIVE

## 2020-11-08 LAB — PLATELET COUNT: Platelets: 82 10*3/uL — ABNORMAL LOW (ref 150–400)

## 2020-11-08 LAB — PROCALCITONIN: Procalcitonin: 2.78 ng/mL

## 2020-11-08 LAB — LACTIC ACID, PLASMA
Lactic Acid, Venous: 2.8 mmol/L (ref 0.5–1.9)
Lactic Acid, Venous: 1.9 mmol/L (ref 0.5–1.9)

## 2020-11-08 LAB — ETHANOL: Alcohol, Ethyl (B): <10 mg/dL (ref <10)

## 2020-11-08 LAB — MAGNESIUM: Magnesium: 2.1 mg/dL (ref 1.7–2.4)

## 2020-11-08 LAB — PHOSPHORUS: Phosphorus: 2.6 mg/dL (ref 2.5–4.6)

## 2020-11-08 MED ORDER — ENOXAPARIN SODIUM 40 MG/0.4ML IJ SOSY
40.0000 mg | PREFILLED_SYRINGE | INTRAMUSCULAR | Status: DC
  Administered 2020-11-08 – 2020-11-22 (×15): 40 mg via SUBCUTANEOUS
  Filled 2020-11-08 (×15): qty 0.4

## 2020-11-08 MED ORDER — VANCOMYCIN HCL 1500 MG/300ML IV SOLN
1500.0000 mg | Freq: Once | INTRAVENOUS | Status: AC
  Administered 2020-11-08: 1500 mg via INTRAVENOUS
  Filled 2020-11-08: qty 300

## 2020-11-08 MED ORDER — VANCOMYCIN HCL 1000 MG/200ML IV SOLN: 1000.0000 mg | Freq: Two times a day (BID) | INTRAVENOUS | Status: DC

## 2020-11-08 MED ORDER — SODIUM CHLORIDE 0.9 % IV SOLN: INTRAVENOUS | Status: DC

## 2020-11-08 MED ORDER — DULOXETINE HCL 60 MG PO CPEP
60.0000 mg | ORAL_CAPSULE | Freq: Every day | ORAL | Status: DC
  Administered 2020-11-08 – 2020-11-22 (×15): 60 mg via ORAL
  Filled 2020-11-08 (×15): qty 1

## 2020-11-08 MED ORDER — THIAMINE HCL 100 MG/ML IJ SOLN
100.0000 mg | Freq: Every day | INTRAMUSCULAR | Status: DC
  Filled 2020-11-08 (×3): qty 2

## 2020-11-08 MED ORDER — SODIUM CHLORIDE 0.9 % IV BOLUS
1000.0000 mL | Freq: Once | INTRAVENOUS | Status: AC
  Administered 2020-11-08: 1000 mL via INTRAVENOUS

## 2020-11-08 MED ORDER — ATORVASTATIN CALCIUM 40 MG PO TABS
40.0000 mg | ORAL_TABLET | Freq: Every day | ORAL | Status: DC
  Administered 2020-11-08 – 2020-11-22 (×15): 40 mg via ORAL
  Filled 2020-11-08 (×15): qty 1

## 2020-11-08 MED ORDER — CHLORDIAZEPOXIDE HCL 25 MG PO CAPS
25.0000 mg | ORAL_CAPSULE | Freq: Four times a day (QID) | ORAL | Status: DC
  Administered 2020-11-08: 25 mg via ORAL
  Filled 2020-11-08: qty 1

## 2020-11-08 MED ORDER — SODIUM CHLORIDE 0.9 % IV SOLN
3.0000 g | Freq: Four times a day (QID) | INTRAVENOUS | Status: DC
  Administered 2020-11-09 – 2020-11-11 (×9): 3 g via INTRAVENOUS
  Filled 2020-11-08 (×3): qty 8
  Filled 2020-11-08 (×3): qty 3
  Filled 2020-11-08: qty 8
  Filled 2020-11-08 (×2): qty 3
  Filled 2020-11-08: qty 8
  Filled 2020-11-08: qty 3
  Filled 2020-11-08: qty 8

## 2020-11-08 MED ORDER — LORAZEPAM 1 MG PO TABS
0.0000 mg | ORAL_TABLET | Freq: Four times a day (QID) | ORAL | Status: DC
  Administered 2020-11-08: 1 mg via ORAL
  Filled 2020-11-08: qty 1

## 2020-11-08 MED ORDER — CHLORDIAZEPOXIDE HCL 25 MG PO CAPS
50.0000 mg | ORAL_CAPSULE | Freq: Four times a day (QID) | ORAL | Status: AC
  Administered 2020-11-08 – 2020-11-09 (×3): 50 mg via ORAL
  Filled 2020-11-08 (×3): qty 2

## 2020-11-08 MED ORDER — CHLORDIAZEPOXIDE HCL 25 MG PO CAPS: 25.0000 mg | ORAL_CAPSULE | Freq: Every day | ORAL | Status: DC

## 2020-11-08 MED ORDER — LORAZEPAM 1 MG PO TABS
1.0000 mg | ORAL_TABLET | ORAL | Status: AC | PRN
  Administered 2020-11-08 – 2020-11-11 (×3): 1 mg via ORAL
  Filled 2020-11-08 (×3): qty 1

## 2020-11-08 MED ORDER — LORAZEPAM 2 MG/ML IJ SOLN: 0.0000 mg | Freq: Two times a day (BID) | INTRAMUSCULAR | Status: DC

## 2020-11-08 MED ORDER — LORAZEPAM 2 MG/ML IJ SOLN
1.0000 mg | INTRAMUSCULAR | Status: AC | PRN
  Administered 2020-11-08: 2 mg via INTRAVENOUS
  Administered 2020-11-09: 4 mg via INTRAVENOUS
  Administered 2020-11-10: 2 mg via INTRAVENOUS
  Filled 2020-11-08: qty 2
  Filled 2020-11-08 (×2): qty 1

## 2020-11-08 MED ORDER — FOLIC ACID 1 MG PO TABS
1.0000 mg | ORAL_TABLET | Freq: Every day | ORAL | Status: DC
  Administered 2020-11-08 – 2020-11-22 (×15): 1 mg via ORAL
  Filled 2020-11-08 (×15): qty 1

## 2020-11-08 MED ORDER — AEROCHAMBER PLUS FLO-VU LARGE MISC: 1.0000 | Freq: Once | Status: AC

## 2020-11-08 MED ORDER — AEROCHAMBER PLUS FLO-VU LARGE MISC
Status: AC
  Administered 2020-11-08: 1
  Filled 2020-11-08: qty 1

## 2020-11-08 MED ORDER — SODIUM CHLORIDE 0.9 % IV SOLN
2.0000 g | Freq: Once | INTRAVENOUS | Status: AC
  Administered 2020-11-08: 2 g via INTRAVENOUS
  Filled 2020-11-08: qty 2

## 2020-11-08 MED ORDER — VANCOMYCIN HCL 1000 MG/200ML IV SOLN
1000.0000 mg | Freq: Two times a day (BID) | INTRAVENOUS | Status: DC
  Administered 2020-11-09 (×2): 1000 mg via INTRAVENOUS
  Filled 2020-11-08 (×3): qty 200

## 2020-11-08 MED ORDER — ACETAMINOPHEN 650 MG RE SUPP: 650.0000 mg | Freq: Four times a day (QID) | RECTAL | Status: DC | PRN

## 2020-11-08 MED ORDER — CHLORDIAZEPOXIDE HCL 25 MG PO CAPS: 25.0000 mg | ORAL_CAPSULE | Freq: Three times a day (TID) | ORAL | Status: DC

## 2020-11-08 MED ORDER — CHLORDIAZEPOXIDE HCL 25 MG PO CAPS: 25.0000 mg | ORAL_CAPSULE | ORAL | Status: DC

## 2020-11-08 MED ORDER — LORAZEPAM 2 MG/ML IJ SOLN: 0.0000 mg | Freq: Four times a day (QID) | INTRAMUSCULAR | Status: DC

## 2020-11-08 MED ORDER — THIAMINE HCL 100 MG PO TABS
100.0000 mg | ORAL_TABLET | Freq: Every day | ORAL | Status: DC
  Administered 2020-11-08 – 2020-11-22 (×15): 100 mg via ORAL
  Filled 2020-11-08 (×15): qty 1

## 2020-11-08 MED ORDER — LORAZEPAM 1 MG PO TABS: 0.0000 mg | ORAL_TABLET | Freq: Two times a day (BID) | ORAL | Status: DC

## 2020-11-08 MED ORDER — IPRATROPIUM BROMIDE HFA 17 MCG/ACT IN AERS
2.0000 | INHALATION_SPRAY | Freq: Once | RESPIRATORY_TRACT | Status: AC
  Administered 2020-11-08: 2 via RESPIRATORY_TRACT
  Filled 2020-11-08: qty 12.9

## 2020-11-08 MED ORDER — SODIUM CHLORIDE 0.9 % IV SOLN
3.0000 g | Freq: Three times a day (TID) | INTRAVENOUS | Status: DC
  Administered 2020-11-08: 3 g via INTRAVENOUS
  Filled 2020-11-08: qty 8
  Filled 2020-11-08: qty 3
  Filled 2020-11-08 (×2): qty 8

## 2020-11-08 MED ORDER — ACETAMINOPHEN 325 MG PO TABS
650.0000 mg | ORAL_TABLET | Freq: Four times a day (QID) | ORAL | Status: DC | PRN
  Administered 2020-11-08 – 2020-11-17 (×3): 650 mg via ORAL
  Filled 2020-11-08 (×3): qty 2

## 2020-11-08 MED ORDER — LACTATED RINGERS IV BOLUS
1000.0000 mL | Freq: Once | INTRAVENOUS | Status: AC
  Administered 2020-11-08: 1000 mL via INTRAVENOUS

## 2020-11-08 MED ORDER — ADULT MULTIVITAMIN W/MINERALS CH
1.0000 | ORAL_TABLET | Freq: Every day | ORAL | Status: DC
  Administered 2020-11-08 – 2020-11-22 (×15): 1 via ORAL
  Filled 2020-11-08 (×15): qty 1

## 2020-11-08 MED ORDER — ONDANSETRON 4 MG PO TBDP
4.0000 mg | ORAL_TABLET | Freq: Four times a day (QID) | ORAL | Status: AC | PRN
  Administered 2020-11-08: 4 mg via ORAL
  Filled 2020-11-08: qty 1

## 2020-11-08 MED ORDER — ALBUTEROL SULFATE HFA 108 (90 BASE) MCG/ACT IN AERS
8.0000 | INHALATION_SPRAY | Freq: Once | RESPIRATORY_TRACT | Status: AC
  Administered 2020-11-08: 8 via RESPIRATORY_TRACT
  Filled 2020-11-08: qty 6.7

## 2020-11-08 NOTE — ED Notes (Signed)
Pt to CT

## 2020-11-08 NOTE — Progress Notes (Addendum)
Pharmacy Antibiotic Note  [name] is a 55 y.o. male admitted on 11/08/2020 with productive cough x 5 days. Chest x-ray with new airspace opacity favoring pneumonia. Pharmacy was consulted for vancomycin dosing for pneumonia earlier today; pt rec'd vancomycin 1500 mg IV X 1, then vancomycin was discontinued and Unasyn was started (3 gm IV Q 8 hrs for aspiration pneumonia).  Scr 0.84 - at baseline, CrCl 91.4 ml/min; WBC 14.1; febrile this evening (101 F).   Pharmacy is consulted to restart vancomycin at this time. Pt rec'd vancomycin 1500 mg IV X 1 at 1341 this afternoon.  Plan: Vancomycin 1 gm IV Q 12 hrs (estimated vancomycin AUC on this regimen, using Scr 0.84 is 523.8; goal vancomycin AUC is 400-550) Increase Unasyn to 3 gm IV Q 6 hrs Monitor WBC, temp, clinical improvement, cultures, renal function F/U MRSA PCR (ordered)  Height: 5\' 7"  (170.2 cm) Weight: 65.8 kg (145 lb) IBW/kg (Calculated) : 66.1  Temp (24hrs), Avg:100.5 F (38.1 C), Min:99.7 F (37.6 C), Max:101 F (38.3 C)  Recent Labs  Lab 11/08/20 0742 11/08/20 0955  WBC 14.1*  --   CREATININE 0.84  --   LATICACIDVEN 2.8* 1.9    Estimated Creatinine Clearance: 91.4 mL/min (by C-G formula based on SCr of 0.84 mg/dL).    No Known Allergies  Antimicrobials this admission: Vancomycin: 5/9 >>  Cefepime X 1: 5/9 Unasyn: 5/9 >>  Microbiology results: 5/9 Bcx X 2: pending 5/9 COVID, flu A, flu B: negative 5/9 MRSA PCR: pending  Thank you for allowing pharmacy to be a part of this patient's care.  Gillermina Hu, PharmD, BCPS, [facility] Clinical Pharmacist 11/08/2020 10:08 PM

## 2020-11-08 NOTE — ED Notes (Signed)
Attempted report 
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
## 2020-11-08 NOTE — ED Notes (Addendum)
On call resident paged regarding pt's high RR and HR. Temp 100.7

## 2020-11-08 NOTE — Progress Notes (Signed)
Patient arrived to room 2w05 from ED.  Bedside report received.  Assessment complete, VS obtained, and Admission database began.  Patient placed on 4L nasal cannula.

## 2020-11-08 NOTE — ED Triage Notes (Signed)
Pt c/o bilateral rib pain, sob, body aches, diarrhea, and productive cough x 5 says; pain worsens with coughing, sob worsens with exertion; endorses coughing up red and brown phlegm; denies known sick contacts

## 2020-11-08 NOTE — ED Notes (Signed)
Pt placed on 2L [place] for RR of 36, though Sats were 97%.
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
## 2020-11-08 NOTE — Progress Notes (Addendum)
Pharmacy Antibiotic Note  [name] is a 55 y.o. male admitted on 11/08/2020 with productive cough x 5 day. Chest x-ray with new airspace opacity favoring pneumonia. Pharmacy has been consulted for vancomycin dosing for pneumonia. Scr 0.84 - at baseline. WBC 14.1. Afebrile.   Plan: Vancomycin 1500mg  x1 followed by 1000mg  q12h (eAUC 526 using Scr 0.84 and TBW for CrCl and Ke) Follow up plans for gram negative coverage Monitor renal function, cultures, and clinical progression  Follow up MRSA PCR  Height: 5\' 7"  (170.2 cm) Weight: 65.8 kg (145 lb) IBW/kg (Calculated) : 66.1  Temp (24hrs), Avg:99.7 F (37.6 C), Min:99.7 F (37.6 C), Max:99.7 F (37.6 C)  Recent Labs  Lab 11/08/20 0742  WBC 14.1*  CREATININE 0.84  LATICACIDVEN 2.8*    Estimated Creatinine Clearance: 91.4 mL/min (by C-G formula based on SCr of 0.84 mg/dL).    No Known Allergies  Antimicrobials this admission: Vancomycin 5/9 >>  Cefepime x1  Dose adjustments this admission: N/a  Microbiology results: 5/9 Bcx:  Thank you for allowing pharmacy to be a part of this patient's care.  Cristela Felt, PharmD Clinical Pharmacist  11/08/2020 11:02 AM   Addendum: Pharmacy now consulted for Unasyn dosing for aspiration PNA. Vancomycin stopped due to unlikely MRSA PNA.   Plan: - Start Unasyn 3g IV q8h  - F/u MRSA PCR and need to resume vancomycin

## 2020-11-09 ENCOUNTER — Encounter (HOSPITAL_COMMUNITY): Payer: Self-pay | Admitting: Internal Medicine

## 2020-11-09 DIAGNOSIS — F10230 Alcohol dependence with withdrawal, uncomplicated: Secondary | ICD-10-CM | POA: Diagnosis not present

## 2020-11-09 LAB — COMPREHENSIVE METABOLIC PANEL
ALT: 14 U/L (ref 0–44)
AST: 30 U/L (ref 15–41)
Albumin: 2.2 g/dL — ABNORMAL LOW (ref 3.5–5.0)
Alkaline Phosphatase: 150 U/L — ABNORMAL HIGH (ref 38–126)
Anion gap: 8 (ref 5–15)
BUN: 8 mg/dL (ref 6–20)
CO2: 25 mmol/L (ref 22–32)
Calcium: 8.3 mg/dL — ABNORMAL LOW (ref 8.9–10.3)
Chloride: 101 mmol/L (ref 98–111)
Creatinine, Ser: 0.71 mg/dL (ref 0.61–1.24)
GFR, Estimated: >60 mL/min (ref >60)
Glucose, Bld: 103 mg/dL — ABNORMAL HIGH (ref 70–99)
Potassium: 3.2 mmol/L — ABNORMAL LOW (ref 3.5–5.1)
Sodium: 134 mmol/L — ABNORMAL LOW (ref 135–145)
Total Bilirubin: 1.5 mg/dL — ABNORMAL HIGH (ref 0.3–1.2)
Total Protein: 6.6 g/dL (ref 6.5–8.1)

## 2020-11-09 LAB — GLUCOSE, CAPILLARY
Glucose-Capillary: 115 mg/dL — ABNORMAL HIGH (ref 70–99)
Glucose-Capillary: 122 mg/dL — ABNORMAL HIGH (ref 70–99)
Glucose-Capillary: 108 mg/dL — ABNORMAL HIGH (ref 70–99)

## 2020-11-09 LAB — CBC
HCT: 34.9 % — ABNORMAL LOW (ref 39.0–52.0)
Hemoglobin: 11.2 g/dL — ABNORMAL LOW (ref 13.0–17.0)
MCH: 33.3 pg (ref 26.0–34.0)
MCHC: 32.1 g/dL (ref 30.0–36.0)
MCV: 103.9 fL — ABNORMAL HIGH (ref 80.0–100.0)
Platelets: UNDETERMINED 10*3/uL (ref 150–400)
RBC: 3.36 MIL/uL — ABNORMAL LOW (ref 4.22–5.81)
RDW: 15 % (ref 11.5–15.5)
WBC: 14.7 10*3/uL — ABNORMAL HIGH (ref 4.0–10.5)
nRBC: 0 % (ref 0.0–0.2)

## 2020-11-09 LAB — URINALYSIS, ROUTINE W REFLEX MICROSCOPIC
Bilirubin Urine: NEGATIVE
Glucose, UA: NEGATIVE mg/dL
Hgb urine dipstick: NEGATIVE
Ketones, ur: 5 mg/dL — AB
Leukocytes,Ua: NEGATIVE
Nitrite: NEGATIVE
Protein, ur: NEGATIVE mg/dL
Specific Gravity, Urine: 1.024 (ref 1.005–1.030)
pH: 5 (ref 5.0–8.0)

## 2020-11-09 LAB — MAGNESIUM: Magnesium: 2.1 mg/dL (ref 1.7–2.4)

## 2020-11-09 LAB — PHOSPHORUS: Phosphorus: 3.6 mg/dL (ref 2.5–4.6)

## 2020-11-09 LAB — MRSA PCR SCREENING: MRSA by PCR: NEGATIVE

## 2020-11-09 MED ORDER — CHLORDIAZEPOXIDE HCL 25 MG PO CAPS
50.0000 mg | ORAL_CAPSULE | Freq: Once | ORAL | Status: AC
  Administered 2020-11-09: 50 mg via ORAL
  Filled 2020-11-09: qty 2

## 2020-11-09 MED ORDER — CHLORDIAZEPOXIDE HCL 25 MG PO CAPS
50.0000 mg | ORAL_CAPSULE | Freq: Three times a day (TID) | ORAL | Status: DC
  Administered 2020-11-10: 50 mg via ORAL
  Filled 2020-11-09: qty 2

## 2020-11-09 MED ORDER — POTASSIUM CHLORIDE CRYS ER 20 MEQ PO TBCR
40.0000 meq | EXTENDED_RELEASE_TABLET | Freq: Once | ORAL | Status: AC
  Administered 2020-11-09: 40 meq via ORAL
  Filled 2020-11-09: qty 2

## 2020-11-09 MED ORDER — CHLORDIAZEPOXIDE HCL 25 MG PO CAPS: 50.0000 mg | ORAL_CAPSULE | Freq: Once | ORAL | Status: DC

## 2020-11-09 MED ORDER — CHLORDIAZEPOXIDE HCL 25 MG PO CAPS: 50.0000 mg | ORAL_CAPSULE | Freq: Two times a day (BID) | ORAL | Status: DC

## 2020-11-09 MED ORDER — PNEUMOCOCCAL VAC POLYVALENT 25 MCG/0.5ML IJ INJ
0.5000 mL | INJECTION | INTRAMUSCULAR | Status: AC
  Administered 2020-11-22: 0.5 mL via INTRAMUSCULAR
  Filled 2020-11-09 (×2): qty 0.5

## 2020-11-09 NOTE — Progress Notes (Signed)
Subjective: Feeling much better than 5 days ago. Has cough with associated phelgm that is dark (reddish and brown) with some side pains. Also endorses diarrhea x7 days. No blood in the stool that he could appreciate. Abdomen increased in size 4 days ago. Has not been eating much. Would like to go home today. Discussed staying one more day in order to monitor and transition to oral abx.  Objective:  Vital signs in last 24 hours: Vitals:   11/09/20 0230 11/09/20 0245 11/09/20 0432 11/09/20 0600  BP:  117/81 123/83 122/83  Pulse: (!) 103 (!) 104 (!) 105 (!) 103  Resp: (!) 31 (!) 29 (!) 28 (!) 28  Temp:  98.7 F (37.1 C) 98.7 F (37.1 C) 98.2 F (36.8 C)  TempSrc:  Axillary Axillary Oral  SpO2: 91% 94% 100% 97%  Weight:      Height:       Weight change:   Intake/Output Summary (Last 24 hours) at 11/09/2020 0655 Last data filed at 11/09/2020 0326 Gross per 24 hour  Intake 3546.98 ml  Output --  Net 3546.98 ml   Physical Exam HENT:     Head: Normocephalic.  Eyes:     Comments: Possibly very mild scleral icterus  Cardiovascular:     Rate and Rhythm: Regular rhythm. Tachycardia present.     Heart sounds: Normal heart sounds.  Pulmonary:     Effort: Pulmonary effort is normal.     Breath sounds: Normal breath sounds.  Abdominal:     General: Bowel sounds are normal. There is distension.     Palpations: Abdomen is soft.  Skin:    General: Skin is warm and dry.  Neurological:     General: No focal deficit present.     Mental Status: He is alert.  Psychiatric:        Mood and Affect: Mood normal.        Behavior: Behavior normal.    Assessment/Plan: #Acute alcohol withdrawal Presents with 5-day history of myalgia and diarrhea, found to be tachycardic, tachypneic on arrival. Drank half a gallon of brandy on Thursday and last drink was Sunday (5/8) - a Budweiser and 2 shots of brandy. His alcohol level on admission was undetectable.   Overnight, patient tachycardic to  130-140s and tachypneic to 20-30s. Given 2 g Ativan and Librium taper increased to 50 mg. This morning, remains tachycardiac to 100s and tachypneic to 20-30s. No tremors visible on exam. CIWA at 5. Given these findings, will continue CIWA protocol and librium taper.  -CIWA protocol with Ativan -Librium taper (50 mg) -Closely monitor vital signs -Follow-up electrolytes closely including magnesium and phosphorus -Continue cardiac monitoring  #Aspiration pneumonia 5-day history of cough productive of brown/white/dark red mucus. Tmax of 101F overnight and afebrile this morning. WBC 14.7. CT from 5/9 revealed dense consolidation in the right lower lobe with surrounding patchy and ground-glass opacities. Imaging findings, procalcitonin of 2.78, and patient's symptoms most likely c/w aspiration pneumonia.   Will follow up MRSA PCR screen to determine if Vancomycin needs to be continued.   -Continue IV Unasyn 3 g and consider transition to PO tomorrow  -IV Vancomycin 1000 mg. If MRSA PCR negative, discontinue.  -Follow fever curve -Follow CBC -Follow-up MRSA PCR  #Low volume hemoptysis Endorses cough productive of dark red/brown/white sputum for which measures at about teaspoonful. Given his history of cirrhosis and acute cough, cannot discount the fact that he could have Mallory-Weiss tear or esophageal varices though very less likely given  small volume hemoptysis. Most likely due to pneumonia.   Hemoglobin is 11.2 from 14.1. No signs of bleeding and patient reports non-bloody diarrhea.   -Will continue to monitor. -Monitor CBC  #Abdominal pain likely 2/2 gastritis  He endorses mild-moderate abdominal pain that is diffused, achy. Lipase 24 and CT abdomen pelvis showed no evidence of pancreatitis. Liquid stool in colon evident of diarrheal illness. Most likely consistent with gastritis, which could be exacerbated by alcohol use.   -Continue IVF and monitor symptoms   #Compensated liver  cirrhosis Discovered on ultrasound that was performed during his prior hospitalization in March which showed coarse echogenic nodular liver.  Does not have elevated AST or ALT on labs. Bilirubin down to 1.5.   -Continue to follow LFTs -Will require EGD at some point  #Dehydration Appears better hydrated on physical exams and lactate trended down.  -Continue IV fluid resuscitation  #Mild troponinemia Likely secondary to demand ischemia. Troponin is flat and EKG negative for ACS   Principal Problem:   Alcohol withdrawal syndrome (HCC) Active Problems:   Tachycardia   Low Volume Hemoptysis   Aspiration pneumonia ([place])   Dehydration   Hyperbilirubinemia    LOS: 1 day   Jacqlyn Larsen, Medical Student 11/09/2020, 6:55 AM  Pager: [phone] After 5pm on weekdays and 1pm on weekends: On Call pager: [phone]

## 2020-11-09 NOTE — Progress Notes (Signed)
Patient arrived to room 2w06 from ED.  Assessment complete, VS obtained, and Admission database began. 

## 2020-11-10 ENCOUNTER — Inpatient Hospital Stay (HOSPITAL_COMMUNITY): Payer: Medicaid Other

## 2020-11-10 DIAGNOSIS — F10230 Alcohol dependence with withdrawal, uncomplicated: Secondary | ICD-10-CM | POA: Diagnosis not present

## 2020-11-10 LAB — COMPREHENSIVE METABOLIC PANEL
ALT: 19 U/L (ref 0–44)
AST: 53 U/L — ABNORMAL HIGH (ref 15–41)
Albumin: 2.1 g/dL — ABNORMAL LOW (ref 3.5–5.0)
Alkaline Phosphatase: 137 U/L — ABNORMAL HIGH (ref 38–126)
Anion gap: 7 (ref 5–15)
BUN: <5 mg/dL — ABNORMAL LOW (ref 6–20)
CO2: 25 mmol/L (ref 22–32)
Calcium: 8.7 mg/dL — ABNORMAL LOW (ref 8.9–10.3)
Chloride: 103 mmol/L (ref 98–111)
Creatinine, Ser: 0.59 mg/dL — ABNORMAL LOW (ref 0.61–1.24)
GFR, Estimated: >60 mL/min (ref >60)
Glucose, Bld: 96 mg/dL (ref 70–99)
Potassium: 3.4 mmol/L — ABNORMAL LOW (ref 3.5–5.1)
Sodium: 135 mmol/L (ref 135–145)
Total Bilirubin: 1 mg/dL (ref 0.3–1.2)
Total Protein: 6.3 g/dL — ABNORMAL LOW (ref 6.5–8.1)

## 2020-11-10 LAB — LACTIC ACID, PLASMA: Lactic Acid, Venous: 0.7 mmol/L (ref 0.5–1.9)

## 2020-11-10 LAB — CBC
HCT: 35.3 % — ABNORMAL LOW (ref 39.0–52.0)
Hemoglobin: 11 g/dL — ABNORMAL LOW (ref 13.0–17.0)
MCH: 32.7 pg (ref 26.0–34.0)
MCHC: 31.2 g/dL (ref 30.0–36.0)
MCV: 105.1 fL — ABNORMAL HIGH (ref 80.0–100.0)
Platelets: UNDETERMINED 10*3/uL (ref 150–400)
RBC: 3.36 MIL/uL — ABNORMAL LOW (ref 4.22–5.81)
RDW: 14.8 % (ref 11.5–15.5)
WBC: 10.1 10*3/uL (ref 4.0–10.5)
nRBC: 0 % (ref 0.0–0.2)

## 2020-11-10 LAB — PHOSPHORUS: Phosphorus: 3.5 mg/dL (ref 2.5–4.6)

## 2020-11-10 LAB — MAGNESIUM: Magnesium: 2 mg/dL (ref 1.7–2.4)

## 2020-11-10 MED ORDER — POTASSIUM CHLORIDE CRYS ER 20 MEQ PO TBCR
40.0000 meq | EXTENDED_RELEASE_TABLET | Freq: Once | ORAL | Status: AC
  Administered 2020-11-10: 40 meq via ORAL
  Filled 2020-11-10: qty 2

## 2020-11-10 MED ORDER — CHLORDIAZEPOXIDE HCL 25 MG PO CAPS
25.0000 mg | ORAL_CAPSULE | Freq: Once | ORAL | Status: AC
  Administered 2020-11-10: 25 mg via ORAL
  Filled 2020-11-10: qty 1

## 2020-11-10 NOTE — Progress Notes (Addendum)
Subjective: ON patient had abdominal distension with continued tachycardia and tachypnea (rates unchanged from his baseline). Patient stated he felt "fine" and speech was slurred. Had just received 4 mg Ativan. Abdominal XR negative for obstruction and bladder scan showed 312 ml.   This morning patient is slow to respond to questions and difficult to understand. No tremors visible.   Objective:  Vital signs in last 24 hours: Vitals:   11/10/20 0500 11/10/20 0600 11/10/20 0700 11/10/20 0722  BP: 131/82 131/78 (!) 150/89 135/86  Pulse: (!) 118 (!) 115 (!) 121 (!) 117  Resp: (!) 29 (!) 28 (!) 29 (!) 25  Temp:    98.5 F (36.9 C)  TempSrc:    Axillary  SpO2: 90% 96% 90% 91%  Weight:      Height:       Weight change:   Intake/Output Summary (Last 24 hours) at 11/10/2020 0817 Last data filed at 11/10/2020 0600 Gross per 24 hour  Intake 1910.26 ml  Output 1150 ml  Net 760.26 ml   Physical Exam HENT:     Head: Normocephalic.  Eyes:     Comments: Possibly very mild scleral icterus  Cardiovascular:     Rate and Rhythm: Regular rhythm. Tachycardia present.     Heart sounds: Normal heart sounds.  Pulmonary:     Effort: Pulmonary effort is normal.     Comments: Mild diffuse wheezing throughout all lung fields Abdominal:     General: Bowel sounds are normal. There is distension.     Comments: Abdomen with greater distention, soft   Skin:    General: Skin is warm and dry.  Neurological:     General: No focal deficit present.     Mental Status: He is alert.  Psychiatric:        Mood and Affect: Mood normal.        Behavior: Behavior normal.    Assessment/Plan: [name] is a 55 year old African-American gentleman with medical history significant for alcohol use disorder, hypertension, depression, CVA, macrocytic anemia, gait ataxia, compensated liver cirrhosis here for management of acute alcohol withdrawal, aspiration pneumonia, dehydration.  #Acute alcohol  withdrawal Presents with 5-day history of myalgia and diarrhea, found to be tachycardic, tachypneic on arrival. Drank half a gallon of brandy on Thursday and last drink was Sunday (5/8) - a Budweiser and 2 shots of brandy. His alcohol level on admission was undetectable.   On exam, patient appears to more sedated as he is slower to respond to questions and difficult to understand which is changed from days prior. Given these findings, will decrease librium dose. Has already received 50 mg at 9 AM.    -CIWA protocol with Ativan -Librium 25 mg once in evening, reevaluate in AM for 5/12 dose of librium  -Closely monitor vital signs -Follow-up electrolytes closely including magnesium and phosphorus -Continue cardiac monitoring  #Aspiration pneumonia 5-day history of cough productive of brown/white/dark red mucus with right lower lobe consolidation on CT scan. Afebrile and WBC downtrending.   -Finish IV Unasyn 3 g today (5/11)  -Start Augmentin tomorrow (5/12) until 5/13 for completion of 5 day course  -Follow fever curve -Follow CBC  #Low volume hemoptysis Endorses cough productive of dark red/brown/white sputum for which measures at about teaspoonful. Given his history of cirrhosis and acute cough, cannot discount the fact that he could have Mallory-Weiss tear or esophageal varices though very less likely given small volume hemoptysis. Most likely due to pneumonia.   Hemoglobin stable at 11  from 11.2.   -Monitor CBC  #Abdominal pain likely 2/2 gastritis  He endorses mild-moderate abdominal pain that is diffused, achy. Lipase 24 and CT abdomen pelvis showed no evidence of pancreatitis. Liquid stool in colon evident of diarrheal illness. Most likely consistent with gastritis, which could be exacerbated by alcohol use.   -Discontinued IVF given increasing abdominal distention  -Continue to monitor symptoms    #Compensated liver cirrhosis Discovered on ultrasound that was performed  during his prior hospitalization in March which showed coarse echogenic nodular liver.  Does not have elevated AST or ALT on labs. Bilirubin down to 1.0.   Overnight patient had increasing abdominal distention, x-ray negative for bowel obstruction. Could be d/t worsening ascites as patient has been on IVF since admission. Will d/c IVF.   -Continue to follow LFTs -Will require EGD at some point  #Dehydration Appears better hydrated on physical exams and lactate trended down.  -Discontinue IV fluid   #Mild troponinemia Likely secondary to demand ischemia. Troponin is flat and EKG negative for ACS   Principal Problem:   Alcohol withdrawal syndrome (HCC) Active Problems:   Tachycardia   Low Volume Hemoptysis   Aspiration pneumonia ([place])   Dehydration   Hyperbilirubinemia    LOS: 2 days   [name], Medical Student 11/10/2020, 8:15 AM  Pager: [phone] After 5pm on weekdays and 1pm on weekends: On Call pager: [phone]

## 2020-11-10 NOTE — Progress Notes (Signed)
CSW continues to follow pt for [employer] screening.  Pt does not appear able to engage in conversation at this point. Lurline Idol, MSW, LCSW 5/11/20223:42 PM

## 2020-11-10 NOTE — Progress Notes (Signed)
Pt ate 100% breakfast with nurse total assistance.  Less than 20% of lunch. 95% of dinner with total assistance from nurse-exception salad d/t pt stating it taste brown.

## 2020-11-10 NOTE — Progress Notes (Signed)
SUBJECTIVE: Paged by RN due to abdominal distention with continued tachycardia and tachypnea despite receiving 50mg  librium and 4mg  lorazepam this evening. Upon my evaluation, patient unable to provide much interval history. He states that he feels "fine" and that his breathing is "fine." Remainder of patient's speech is slurred.  OBJECTIVE General - ill-appearing man, supine in hospital bed, able to open eyes to verbal stimulation and mumbling mostly slurred speech. Respiratory - Rapid, shallow respirations.  Abdominal - Abdominal distention with tympany to percussion anteriorly and dullness to percussion in lateral abdomen  Extremities - Trace pitting edema of bilateral lower extremities  ASSESSMENT/PLAN:  #Acute alcohol withdrawal #Sedation Patient is significantly sedated following receiving librium and ativan administration this evening. Despite these interventions, patient continues to remain tachycardic and tachypneic. Patient's last drink was approximately three days ago. -Continue CIWA with ativan  #Aspiration pneumonia Patient remains tachypneic and tachycardic in the setting of his known infection. -Continue IV Unasyn -Obtain lactic acid  #Abdominal distention #Cirrhosis Patient has significant abdominal distention on examination with examination findings consistent with likely ascites. Patient is not on a diuretic and has been receiving 100cc/hr IVF since admission with no urine output recorded. -Bladder scan -Strict intake and output -Abdominal XR, stat

## 2020-11-10 NOTE — Progress Notes (Signed)
Notified MD of tachypnea, crackles in lungs and distended abdomen.

## 2020-11-11 DIAGNOSIS — F10230 Alcohol dependence with withdrawal, uncomplicated: Secondary | ICD-10-CM | POA: Diagnosis not present

## 2020-11-11 LAB — CBC
HCT: 37 % — ABNORMAL LOW (ref 39.0–52.0)
Hemoglobin: 11.9 g/dL — ABNORMAL LOW (ref 13.0–17.0)
MCH: 33.1 pg (ref 26.0–34.0)
MCHC: 32.2 g/dL (ref 30.0–36.0)
MCV: 102.8 fL — ABNORMAL HIGH (ref 80.0–100.0)
Platelets: 130 10*3/uL — ABNORMAL LOW (ref 150–400)
RBC: 3.6 MIL/uL — ABNORMAL LOW (ref 4.22–5.81)
RDW: 14.4 % (ref 11.5–15.5)
WBC: 9.8 10*3/uL (ref 4.0–10.5)
nRBC: 0 % (ref 0.0–0.2)

## 2020-11-11 LAB — COMPREHENSIVE METABOLIC PANEL
ALT: 35 U/L (ref 0–44)
AST: 79 U/L — ABNORMAL HIGH (ref 15–41)
Albumin: 2 g/dL — ABNORMAL LOW (ref 3.5–5.0)
Alkaline Phosphatase: 188 U/L — ABNORMAL HIGH (ref 38–126)
Anion gap: 7 (ref 5–15)
BUN: <5 mg/dL — ABNORMAL LOW (ref 6–20)
CO2: 29 mmol/L (ref 22–32)
Calcium: 8.8 mg/dL — ABNORMAL LOW (ref 8.9–10.3)
Chloride: 99 mmol/L (ref 98–111)
Creatinine, Ser: 0.57 mg/dL — ABNORMAL LOW (ref 0.61–1.24)
GFR, Estimated: >60 mL/min (ref >60)
Glucose, Bld: 106 mg/dL — ABNORMAL HIGH (ref 70–99)
Potassium: 3.5 mmol/L (ref 3.5–5.1)
Sodium: 135 mmol/L (ref 135–145)
Total Bilirubin: 0.8 mg/dL (ref 0.3–1.2)
Total Protein: 6.4 g/dL — ABNORMAL LOW (ref 6.5–8.1)

## 2020-11-11 LAB — PHOSPHORUS: Phosphorus: 3.3 mg/dL (ref 2.5–4.6)

## 2020-11-11 LAB — MAGNESIUM: Magnesium: 1.8 mg/dL (ref 1.7–2.4)

## 2020-11-11 MED ORDER — LORAZEPAM 2 MG/ML IJ SOLN
1.0000 mg | INTRAMUSCULAR | Status: AC | PRN
  Administered 2020-11-12: 1 mg via INTRAVENOUS
  Filled 2020-11-11: qty 1

## 2020-11-11 MED ORDER — AMOXICILLIN-POT CLAVULANATE 875-125 MG PO TABS
1.0000 | ORAL_TABLET | ORAL | Status: DC
  Administered 2020-11-11: 1 via ORAL
  Filled 2020-11-11: qty 1

## 2020-11-11 MED ORDER — LORAZEPAM 1 MG PO TABS
1.0000 mg | ORAL_TABLET | ORAL | Status: AC | PRN
  Filled 2020-11-11: qty 1

## 2020-11-11 MED ORDER — AMOXICILLIN-POT CLAVULANATE 875-125 MG PO TABS
1.0000 | ORAL_TABLET | Freq: Two times a day (BID) | ORAL | Status: AC
  Administered 2020-11-11 – 2020-11-12 (×3): 1 via ORAL
  Filled 2020-11-11 (×2): qty 1

## 2020-11-11 NOTE — Progress Notes (Signed)
PHARMACY NOTE:  ANTIMICROBIAL RENAL DOSAGE ADJUSTMENT  Current antimicrobial regimen includes a mismatch between antimicrobial dosage and estimated renal function.  As per policy approved by the Pharmacy & Therapeutics and Medical Executive Committees, the antimicrobial dosage will be adjusted accordingly.  Current antimicrobial dosage:  Augmentin 875mg  qday  Indication: PNA  Renal Function:  Estimated Creatinine Clearance: 96.4 mL/min (A) (by C-G formula based on SCr of 0.57 mg/dL (L)). []      On intermittent HD, scheduled: []      On CRRT    Antimicrobial dosage has been changed to:  Augmentin 875mg  BID  Additional comments:   Onnie Boer, PharmD, BCIDP, AAHIVP, CPP Infectious Disease Pharmacist 11/11/2020 1:28 PM

## 2020-11-11 NOTE — Progress Notes (Addendum)
Subjective: No acute events overnight. Patient remains sedated this morning and continues to be slow to respond and speak. Patient's fiance at beside this morning and discussed his hospital course with her. She reports he is not at his baseline and is much worse. She reports patient may have been drinking more than usual prior to admission. Discussed importance of alcohol cessation and she voiced understanding and in agreement.   Objective:  Vital signs in last 24 hours: Vitals:   11/10/20 2300 11/11/20 0046 11/11/20 0400 11/11/20 0416  BP:  (!) 143/87 (!) 140/93   Pulse:  (!) 117 (!) 118   Resp: 20 20 (!) 21   Temp:  98 F (36.7 C) 98.5 F (36.9 C)   TempSrc:  Oral Oral   SpO2:  96% 97%   Weight:    68.5 kg  Height:       Weight change:   Intake/Output Summary (Last 24 hours) at 11/11/2020 0616 Last data filed at 11/10/2020 2146 Gross per 24 hour  Intake --  Output 800 ml  Net -800 ml   Physical Exam HENT:     Head: Normocephalic.  Eyes:     Comments: Possibly very mild scleral icterus  Cardiovascular:     Rate and Rhythm: Regular rhythm. Tachycardia present.     Heart sounds: Normal heart sounds.  Pulmonary:     Effort: Pulmonary effort is normal.     Comments: Mild wheezes heard on anterior auscultation.  Abdominal:     General: Bowel sounds are normal. There is distension.     Comments: Abdomen with distention, improving, soft   Skin:    General: Skin is warm and dry.  Neurological:     General: No focal deficit present.     Mental Status: He is alert.  Psychiatric:        Mood and Affect: Mood normal.        Behavior: Behavior normal.    Assessment/Plan: Mr. Treinen is a 55 year old African-American gentleman with medical history significant for alcohol use disorder, hypertension, depression, CVA, macrocytic anemia, gait ataxia, compensated liver cirrhosis here for management of acute alcohol withdrawal, aspiration pneumonia, dehydration.  #Acute alcohol  withdrawal Patient continues to be to more sedated after getting total of 75 mg of Librium on 5/11. Will hold Librium today given he is still sedated and not at his baseline. Will continue Ativan as needed with CIWA protocol.   -CIWA protocol with Ativan -Hold Librium  -Closely monitor vital signs -Follow-up electrolytes closely including magnesium and phosphorus -Continue cardiac monitoring  #Aspiration pneumonia 5-day history of cough productive of brown/white/dark red mucus with right lower lobe consolidation on CT scan. Afebrile and WBC downtrending.   -Augmentin today (5/12) until 5/13 for completion of 5 day course  -Follow fever curve -Follow CBC  #Low volume hemoptysis Endorses cough productive of dark red/brown/white sputum for which measures at about teaspoonful. Most likely due to pneumonia given small volume.  Hemoglobin stable at 11.9   -Monitor CBC  #Compensated liver cirrhosis #Ascites  Liver enzymes WNL and T bili WNL.  Has distention of abdomen most likely due to continuous IVF (d/ced on 5/11) resulting in increasing ascites. Abdomen is soft and bowel sounds active. No paracentesis at this time, will continue to monitor/  -Discontinued IVF on 5/11 given increasing abdominal distention  -Continue to monitor symptoms  -Continue to follow liver enzymes and t bili  -Will require EGD at some point   #Abdominal pain likely 2/2 gastritis  Does not endorse abdominal pain today. Continue to monitor symptoms.     Principal Problem:   Alcohol withdrawal syndrome (HCC) Active Problems:   Tachycardia   Low Volume Hemoptysis   Aspiration pneumonia ([place])   Dehydration   Hyperbilirubinemia    LOS: 3 days   Jacqlyn Larsen, Medical Student 11/11/2020, 12:59 PM  Pager: [phone] After 5pm on weekdays and 1pm on weekends: On Call pager: [phone]

## 2020-11-12 DIAGNOSIS — F10230 Alcohol dependence with withdrawal, uncomplicated: Secondary | ICD-10-CM | POA: Diagnosis not present

## 2020-11-12 LAB — COMPREHENSIVE METABOLIC PANEL
ALT: 36 U/L (ref 0–44)
AST: 74 U/L — ABNORMAL HIGH (ref 15–41)
Albumin: 2 g/dL — ABNORMAL LOW (ref 3.5–5.0)
Alkaline Phosphatase: 195 U/L — ABNORMAL HIGH (ref 38–126)
Anion gap: 8 (ref 5–15)
BUN: <5 mg/dL — ABNORMAL LOW (ref 6–20)
CO2: 29 mmol/L (ref 22–32)
Calcium: 9.2 mg/dL (ref 8.9–10.3)
Chloride: 98 mmol/L (ref 98–111)
Creatinine, Ser: 0.62 mg/dL (ref 0.61–1.24)
GFR, Estimated: >60 mL/min (ref >60)
Glucose, Bld: 108 mg/dL — ABNORMAL HIGH (ref 70–99)
Potassium: 3.3 mmol/L — ABNORMAL LOW (ref 3.5–5.1)
Sodium: 135 mmol/L (ref 135–145)
Total Bilirubin: 1 mg/dL (ref 0.3–1.2)
Total Protein: 6.6 g/dL (ref 6.5–8.1)

## 2020-11-12 LAB — CBC
HCT: 37.6 % — ABNORMAL LOW (ref 39.0–52.0)
Hemoglobin: 12.2 g/dL — ABNORMAL LOW (ref 13.0–17.0)
MCH: 33.1 pg (ref 26.0–34.0)
MCHC: 32.4 g/dL (ref 30.0–36.0)
MCV: 101.9 fL — ABNORMAL HIGH (ref 80.0–100.0)
Platelets: 165 10*3/uL (ref 150–400)
RBC: 3.69 MIL/uL — ABNORMAL LOW (ref 4.22–5.81)
RDW: 14.1 % (ref 11.5–15.5)
WBC: 8.6 10*3/uL (ref 4.0–10.5)
nRBC: 0 % (ref 0.0–0.2)

## 2020-11-12 LAB — PHOSPHORUS: Phosphorus: 3.6 mg/dL (ref 2.5–4.6)

## 2020-11-12 LAB — MAGNESIUM: Magnesium: 1.8 mg/dL (ref 1.7–2.4)

## 2020-11-12 MED ORDER — POTASSIUM CHLORIDE CRYS ER 20 MEQ PO TBCR
40.0000 meq | EXTENDED_RELEASE_TABLET | Freq: Once | ORAL | Status: AC
  Administered 2020-11-12: 40 meq via ORAL
  Filled 2020-11-12: qty 2

## 2020-11-12 NOTE — Plan of Care (Signed)

## 2020-11-12 NOTE — Progress Notes (Signed)
Subjective: No acute events overnight. Did not need Ativan overnight. CIWA 3 at midnight and CIWA 0 at 6 AM. Patient more awake and alert today. Discussed withdrawal symptoms and aspiration pneumonia are directly related to alcohol use. Recognizes that alcohol is contributing to his disease process and states he is scared. Has tried outpatient rehab in the past and felt like he did not have friends there. Has a friend currently who is in [place] and feels like he would be good support. Patient states he will reach out to his friend once out of the hospital. Discussed he will be here at least one more day due to continued tachycardia.   Objective:  Vital signs in last 24 hours: Vitals:   11/11/20 1500 11/11/20 2100 11/12/20 0000 11/12/20 0500  BP: (!) 145/98 (!) 160/99 (!) 151/93 (!) 148/90  Pulse: (!) 111 (!) 114 (!) 109 (!) 101  Resp: (!) 22 20 (!) 21 19  Temp: 99.4 F (37.4 C) 98.3 F (36.8 C) 98.9 F (37.2 C) 98.2 F (36.8 C)  TempSrc: Skin Oral Oral Oral  SpO2: 100% 95% 96% 98%  Weight:      Height:       Weight change:  No intake or output data in the 24 hours ending 11/12/20 8756 Physical Exam HENT:     Head: Normocephalic.  Eyes:     Conjunctiva/sclera: Conjunctivae normal.  Cardiovascular:     Rate and Rhythm: Regular rhythm. Tachycardia present.     Heart sounds: Normal heart sounds.  Pulmonary:     Effort: Pulmonary effort is normal.     Comments: Mild wheezes heard on anterior auscultation.  Abdominal:     General: Bowel sounds are normal. There is distension.     Tenderness: There is no abdominal tenderness.     Comments: Abdomen with distention, improving, soft   Musculoskeletal:     Comments: Trace lower extremity pretibial edema bilaterally   Skin:    General: Skin is warm and dry.  Neurological:     General: No focal deficit present.     Mental Status: He is alert.  Psychiatric:        Mood and Affect: Mood normal.        Behavior: Behavior normal.     Assessment/Plan: [name] is a 55 year old African-American gentleman with medical history significant for alcohol use disorder, hypertension, depression, CVA, macrocytic anemia, gait ataxia, compensated liver cirrhosis here for management of acute alcohol withdrawal, aspiration pneumonia, dehydration.  #Acute alcohol withdrawal Patient appears more alert today. Per conversation with team, patient shows insight into alcohol use disorder and what role this is playing in his health. Has tried outpatient rehab before without success and currently has a friend in [place]. Will continue to talk with patient about alcohol cessation and resources that can help him outside of the hospital.   Per conversation with patient's fiance and mother, they recognize alcohol use as a severe detriment to his health and have encouraged him to stop drinking alcohol many times. Mother states she has watched many family members struggle with alcohol use and pass away from it. States she will support [name] in any way to help him stop drinking.   -CIWA protocol with Ativan -Continue to hold Librium as CIWA score trending down and patient more awake and alert -Closely monitor vital signs -Follow-up electrolytes closely including magnesium and phosphorus -Continue cardiac monitoring  #Aspiration pneumonia 5-day history of cough productive of brown/white/dark red mucus with right lower  lobe consolidation on CT scan. Afebrile and WBC downtrending.   -Last dose Augmentin BID today (5/13) for completion of 5 day course  -Follow fever curve -Follow CBC  #Low volume hemoptysis Endorses cough productive of dark red/brown/white sputum for which measures at about teaspoonful. Most likely due to pneumonia given small volume.  Hemoglobin stable at 12.2.   -Monitor CBC  #Compensated liver cirrhosis #Ascites  Liver enzymes WNL and T bili WNL.  Has distention of abdomen most likely due to continuous IVF (d/ced on  5/11) resulting in increasing ascites. Abdomen is soft and bowel sounds active.  Abdomen is softer today and distention is improving.   -Continue to monitor symptoms  -Continue to follow liver enzymes and t bili  -Will require EGD at some point  #Abdominal pain likely 2/2 gastritis (resolved)  Does not endorse abdominal pain today. Continue to monitor symptoms.     Principal Problem:   Alcohol withdrawal syndrome (HCC) Active Problems:   Tachycardia   Low Volume Hemoptysis   Aspiration pneumonia ([place])   Dehydration   Hyperbilirubinemia    LOS: 4 days   Jacqlyn Larsen, Medical Student 11/12/2020, 6:27 AM  Pager: [phone] After 5pm on weekdays and 1pm on weekends: On Call pager: [phone]

## 2020-11-12 NOTE — TOC CAGE-AID Note (Signed)
Transition of Care [facility]) - CAGE-AID Screening   Patient Details  Name: [name] MRN: [id number] Date of Birth: 1966-01-08  [facility]) CM/SW Contact:    Joanne Chars, LCSW Phone Number: 11/12/2020, 9:52 AM   Clinical Narrative:CSW met with pt to complete CAGE AID.  Pt reports he drinks alcohol 4x week, usually 4-5 beers per time.  Pt also reports marijauna use "not a lot", appears to be weekly.  Pt unable to explain positive tox screen for benzos, denies he uses pills.  Pt also acknowledges cocaine use 1x per month.  CSW discussed Cage aid score of 4, indicates need to consider that he has significant substance use issues.  Pt reports he stopped drinking for a month recently and did feel better, reports he was angry one day and ended up restarting alcohol use.  Pt reports he has been in treatment at [facility] once, several years ago and it was helpful.  Pt reports his goal now is to stop drinking.  CSW provided contact info and discussed both outpt and residential treatment options.  Pt engaged in discussion and verbalized understanding.      CAGE-AID Screening:    Have You Ever Felt You Ought to Cut Down on Your Drinking or Drug Use?: Yes Have People Annoyed You By Critizing Your Drinking Or Drug Use?: Yes Have You Felt Bad Or Guilty About Your Drinking Or Drug Use?: Yes Have You Ever Had a Drink or Used Drugs First Thing In The Morning to Steady Your Nerves or to Get Rid of a Hangover?: Yes CAGE-AID Score: 4  Substance Abuse Education Offered: Yes  Substance abuse interventions: Patient Counseling,Other (must comment) (treatment provider contact info)

## 2020-11-12 NOTE — Progress Notes (Signed)
Called MD to notify of Pt.'s elevated BP, HR, RR, and yellow mews. Spoke to MD Sharol Given. Said to monitor pt. And notify if these continue to be elevated. MD said to notify if CIWA score is >3.   11/12/20 1319  Vitals  Temp 98.1 F (36.7 C)  BP (!) 141/105  MAP (mmHg) 116  BP Location Right Arm  BP Method Automatic  Patient Position (if appropriate) Lying  Pulse Rate (!) 110  Pulse Rate Source Monitor  Resp (!) 25  Level of Consciousness  Level of Consciousness Alert  MEWS COLOR  MEWS Score Color Yellow  Oxygen Therapy  SpO2 96 %  O2 Device Room Air  Pain Assessment  Pain Scale 0-10  Pain Score 0  MEWS Score  MEWS Temp 0  MEWS Systolic 0  MEWS Pulse 1  MEWS RR 1  MEWS LOC 0  MEWS Score 2  Provider Notification  Provider Name/Title Dr. Sharol Given  Date Provider Notified 11/12/20  Time Provider Notified 1320  Notification Type Call  Notification Reason Other (Comment) (FYI)  Provider response No new orders;Other (Comment) (Just continue to monitor and notify if worse and if CIWA score >3)  Date of Provider Response 11/12/20  Time of Provider Response 1326

## 2020-11-12 NOTE — TOC Initial Note (Signed)
Transition of Care [facility]) - Initial/Assessment Note    Patient Details  Name: [name] MRN: [id number] Date of Birth: 1965-08-16  Transition of Care [facility]) CM/SW Contact:    Joanne Chars, LCSW Phone Number: 11/12/2020, 10:00 AM  Clinical Narrative:   CSW met with pt regarding Cage Aid screening.  Pt initially asleep but woke up, participated and was engaged, speech still somewhat slurred, pt difficult to understand.    See Cage Aid note.  Permission given to speak with Beverly Milch.  No discharge needs identified to date.  CSW will continue to follow.              Expected Discharge Plan: Home/Self Care Barriers to Discharge: Continued Medical Work up   Patient Goals and CMS Choice Patient states their goals for this hospitalization and ongoing recovery are:: stop drinking CMS Medicare.gov Compare Post Acute Care list provided to::  (na)    Expected Discharge Plan and Services Expected Discharge Plan: Home/Self Care In-house Referral: Clinical Social Work   Post Acute Care Choice: NA Living arrangements for the past 2 months: Single Family Home                 DME Arranged: N/A         HH Arranged: NA          Prior Living Arrangements/Services Living arrangements for the past 2 months: Single Family Home Lives with:: Significant Other Patient language and need for interpreter reviewed:: Yes        Need for Family Participation in Patient Care: No (Comment) Care giver support system in place?: Yes (comment) Current home services: Other (comment) (none) Criminal Activity/Legal Involvement Pertinent to Current Situation/Hospitalization: No - Comment as needed  Activities of Daily Living Home Assistive Devices/Equipment: None ADL Screening (condition at time of admission) Patient's cognitive ability adequate to safely complete daily activities?: Yes Is the patient deaf or have difficulty hearing?: No Does the patient have difficulty seeing, even when  wearing glasses/contacts?: No Does the patient have difficulty concentrating, remembering, or making decisions?: No Patient able to express need for assistance with ADLs?: Yes Does the patient have difficulty dressing or bathing?: No Independently performs ADLs?: Yes (appropriate for developmental age) Does the patient have difficulty walking or climbing stairs?: Yes Weakness of Legs: Both Weakness of Arms/Hands: None  Permission Sought/Granted Permission sought to share information with : Family Supports Permission granted to share information with : Yes, Verbal Permission Granted  Share Information with NAME: fiance Hassan Rowan           Emotional Assessment Appearance:: Appears stated age Attitude/Demeanor/Rapport: Engaged Affect (typically observed): Appropriate,Pleasant Orientation: : Oriented to Self,Oriented to Place,Oriented to Situation Alcohol / Substance Use: Alcohol Use,Illicit Drugs Psych Involvement: No (comment)  Admission diagnosis:  Alcohol withdrawal syndrome ([place]) [F10.239] HCAP (healthcare-associated pneumonia) [J18.9] Patient Active Problem List   Diagnosis Date Noted  . Alcohol withdrawal syndrome ([place]) 11/08/2020  . Low Volume Hemoptysis 11/08/2020  . Aspiration pneumonia ([place]) 11/08/2020  . Dehydration 11/08/2020  . Hyperbilirubinemia 11/08/2020  . Tachycardia 10/06/2020  . Generalized weakness 10/06/2020  . Syncope 09/27/2020  . Vitamin D deficiency 07/06/2020  . Right hip pain 07/02/2020  . Alcohol abuse 02/13/2020  . Elevated serum GGT level 11/06/2019  . Paresthesia 05/15/2019  . Pain in both lower extremities 05/15/2019  . Functional incontinence 04/14/2019  . Gait abnormality 04/14/2019  . 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 Anemia 03/14/2018  . Left leg pain 01/21/2017  . Essential hypertension 01/21/2017  .  MDD (major depressive disorder) 03/25/2010  . ERECTILE DYSFUNCTION 05/14/2008  . GERD 04/13/2008  . TOBACCO ABUSE 03/11/2008   PCP:   [name], MD Pharmacy:   [facility] [place] [address] Phone: [phone] Fax: [phone]     Social Determinants of Health (SDOH) Interventions    Readmission Risk Interventions No flowsheet data found.

## 2020-11-13 DIAGNOSIS — F10230 Alcohol dependence with withdrawal, uncomplicated: Secondary | ICD-10-CM | POA: Diagnosis not present

## 2020-11-13 LAB — CBC
HCT: 38.9 % — ABNORMAL LOW (ref 39.0–52.0)
Hemoglobin: 12.6 g/dL — ABNORMAL LOW (ref 13.0–17.0)
MCH: 32.6 pg (ref 26.0–34.0)
MCHC: 32.4 g/dL (ref 30.0–36.0)
MCV: 100.5 fL — ABNORMAL HIGH (ref 80.0–100.0)
Platelets: 215 10*3/uL (ref 150–400)
RBC: 3.87 MIL/uL — ABNORMAL LOW (ref 4.22–5.81)
RDW: 14.3 % (ref 11.5–15.5)
WBC: 11.1 10*3/uL — ABNORMAL HIGH (ref 4.0–10.5)
nRBC: 0.2 % (ref 0.0–0.2)

## 2020-11-13 LAB — CULTURE, BLOOD (ROUTINE X 2)
Culture: NO GROWTH
Culture: NO GROWTH

## 2020-11-13 LAB — COMPREHENSIVE METABOLIC PANEL
ALT: 35 U/L (ref 0–44)
AST: 62 U/L — ABNORMAL HIGH (ref 15–41)
Albumin: 2 g/dL — ABNORMAL LOW (ref 3.5–5.0)
Alkaline Phosphatase: 204 U/L — ABNORMAL HIGH (ref 38–126)
Anion gap: 10 (ref 5–15)
BUN: <5 mg/dL — ABNORMAL LOW (ref 6–20)
CO2: 27 mmol/L (ref 22–32)
Calcium: 9 mg/dL (ref 8.9–10.3)
Chloride: 97 mmol/L — ABNORMAL LOW (ref 98–111)
Creatinine, Ser: 0.52 mg/dL — ABNORMAL LOW (ref 0.61–1.24)
GFR, Estimated: >60 mL/min (ref >60)
Glucose, Bld: 100 mg/dL — ABNORMAL HIGH (ref 70–99)
Potassium: 3.5 mmol/L (ref 3.5–5.1)
Sodium: 134 mmol/L — ABNORMAL LOW (ref 135–145)
Total Bilirubin: 0.6 mg/dL (ref 0.3–1.2)
Total Protein: 6.7 g/dL (ref 6.5–8.1)

## 2020-11-13 LAB — MAGNESIUM: Magnesium: 1.9 mg/dL (ref 1.7–2.4)

## 2020-11-13 LAB — PHOSPHORUS: Phosphorus: 3.6 mg/dL (ref 2.5–4.6)

## 2020-11-13 NOTE — Plan of Care (Signed)

## 2020-11-13 NOTE — Progress Notes (Signed)
   Subjective: ON Events: None   Seen at bedside. He states that he is doing well this morning. No new complaints.   Objective:  Vital signs in last 24 hours: Vitals:   11/12/20 1319 11/12/20 1415 11/12/20 1626 11/12/20 2246  BP: (!) 141/105 (!) 151/98 (!) 135/93 (!) 150/94  Pulse: (!) 110 (!) 112 (!) 118 (!) 112  Resp: (!) 25 20 (!) 23 (!) 23  Temp: 98.1 F (36.7 C) 98.2 F (36.8 C) 98.9 F (37.2 C) 98.6 F (37 C)  TempSrc:   Oral   SpO2: 96% 97% 97% 98%  Weight:      Height:       Physical Exam Constitutional:      General: He is not in acute distress.    Appearance: He is not toxic-appearing.     Comments: Answers questions appropriately.   Cardiovascular:     Rate and Rhythm: Regular rhythm. Tachycardia present.     Heart sounds: No murmur heard. No friction rub. No gallop.   Pulmonary:     Effort: Pulmonary effort is normal.     Breath sounds: Normal breath sounds. No decreased breath sounds, wheezing, rhonchi or rales.  Abdominal:     General: Bowel sounds are normal.     Palpations: Abdomen is soft.     Tenderness: There is no abdominal tenderness. There is no guarding.     Comments: Distended abdomen, soft, non tender to palpation.   Neurological:     Mental Status: He is alert.     BMP Latest Ref Rng & Units 11/13/2020 11/12/2020 11/11/2020  Glucose 70 - 99 mg/dL 100(H) 108(H) 106(H)  BUN 6 - 20 mg/dL <5(L) <5(L) <5(L)  Creatinine 0.61 - 1.24 mg/dL 0.52(L) 0.62 0.57(L)  BUN/Creat Ratio 9 - 20 - - -  Sodium 135 - 145 mmol/L 134(L) 135 135  Potassium 3.5 - 5.1 mmol/L 3.5 3.3(L) 3.5  Chloride 98 - 111 mmol/L 97(L) 98 99  CO2 22 - 32 mmol/L 27 29 29   Calcium 8.9 - 10.3 mg/dL 9.0 9.2 8.8(L)    CBC Latest Ref Rng & Units 11/13/2020 11/12/2020 11/11/2020  WBC 4.0 - 10.5 K/uL 11.1(H) 8.6 9.8  Hemoglobin 13.0 - 17.0 g/dL 12.6(L) 12.2(L) 11.9(L)  Hematocrit 39.0 - 52.0 % 38.9(L) 37.6(L) 37.0(L)  Platelets 150 - 400 K/uL 215 165 130(L)       Assessment/Plan: Mr. Fronczak is a 55 year old African-American gentleman with medical history significant for alcohol use disorder, hypertension, depression, CVA, macrocytic anemia, gait ataxia, compensated liver cirrhosis here for management of acute alcohol withdrawal, aspiration pneumonia, dehydration.  #Alcohol Use Disorder Acute Alcohol Withdrawal Patient states he is doing well. CIWA scores continue to stay at 3. Continues to have persistent tachycardia, which has been downtrending. Pulse wnl in outpatient setting.  -CIWA protocol with Ativan -Monitor  Electrolytes, including magnesium and phosphorus  #Aspiration pneumonia Completed Total 5 day course of Unasyn and transitioned to Augmentin.   #Compensated liver cirrhosis #Ascites  Liver AST down trending.  Has distention of abdomen most likely due to continuous IVF (d/ced on 5/11) resulting in increasing ascites. Abdomen is soft and bowel sounds active.  Abdomen is soft and nontender to palpation. Distension continues to improve.   -Continue to monitor symptoms  -Continue to follow CMP   [name], MD 11/13/2020, 6:42 AM Pager: [phone] After 5pm on weekdays and 1pm on weekends: On Call pager [phone]

## 2020-11-14 DIAGNOSIS — F10230 Alcohol dependence with withdrawal, uncomplicated: Secondary | ICD-10-CM | POA: Diagnosis not present

## 2020-11-14 NOTE — Progress Notes (Signed)
   Subjective:  Patient states he is feeling much better this morning. Alert and speech is less slurred. Patient ready to go home. Has not ambulated during admission and awaiting PT evaluation.   Objective:  Vital signs in last 24 hours: Vitals:   11/14/20 0436 11/14/20 0735 11/14/20 0740 11/14/20 1145  BP: 108/74  130/78 129/86  Pulse: 83   (!) 112  Resp: 20   (!) 23  Temp: 98.3 F (36.8 C)  99.2 F (37.3 C) 98.7 F (37.1 C)  TempSrc: Oral  Oral Oral  SpO2: 100% 100%  96%  Weight:      Height:       Weight change:   Intake/Output Summary (Last 24 hours) at 11/14/2020 1156 Last data filed at 11/14/2020 8546 Gross per 24 hour  Intake --  Output 1600 ml  Net -1600 ml    Physical Exam Constitutional:      General: He is not in acute distress.    Appearance: He is not toxic-appearing.     Comments: Answers questions appropriately.   Cardiovascular:     Rate and Rhythm: Regular rhythm. Tachycardia present.     Heart sounds: No murmur heard. No friction rub. No gallop.   Pulmonary:     Effort: Pulmonary effort is normal.     Breath sounds: Normal breath sounds. No decreased breath sounds, wheezing, rhonchi or rales.  Abdominal:     General: Bowel sounds are normal.     Palpations: Abdomen is soft.     Tenderness: There is no abdominal tenderness. There is no guarding.     Comments: Abdomen soft, non tender to palpation. Non-distended.  Neurological:     Mental Status: He is alert.    Assessment/Plan:  Principal Problem:   Alcohol withdrawal syndrome (HCC) Active Problems:   Tachycardia   Low Volume Hemoptysis   Aspiration pneumonia (HCC)   Dehydration   Hyperbilirubinemia  Mr. Borgwardt is a 55 year old African-American gentleman with medical history significant for alcohol use disorder, hypertension, depression, CVA, macrocytic anemia, gait ataxia, compensated liver cirrhosis here for management of acute alcohol withdrawal, aspiration pneumonia,  dehydration.  #Alcohol Use Disorder Acute Alcohol Withdrawal Patient states he is doing well. CIWA scores at 1. Continues to have persistent tachycardia, which has been downtrending. Pulse wnl in outpatient setting.   -Pending discharge per PT evaluation and recommendations -CIWA protocol with Ativan, has not required Ativan in past 48 hours  -Monitor electrolytes, including magnesium and phosphorus  #Aspiration pneumonia Completed Total 5 day course of Unasyn and transitioned to Augmentin.   #Compensated liver cirrhosis #Ascites  T bili and liver enzymes within normal limits  Developed distention of abdomen most likely due to continuous IVF (d/ced on 5/11) resulting in increasing ascites.   -Abdomen is soft and nontender to palpation. Distention has improved, no longer present.     LOS: 6 days   Jacqlyn Larsen, Medical Student 11/14/2020, 11:56 AM  Pager: [phone] After 5pm on weekdays and 1pm on weekends: On Call pager: [phone]

## 2020-11-14 NOTE — Progress Notes (Signed)
Internal Medicine Attending Note:  This patient's plan of care was discussed with the house staff. Please see their note for complete details. I concur with their findings.  Velna Ochs, MD 11/14/2020, 5:27 PM
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
## 2020-11-14 NOTE — Discharge Instructions (Signed)
Hello [name], it was a pleasure taking care of you in the hospital. You were admitted to the hospital for alcohol withdrawal and pneumonia. Here are instructions for when you get home:   1. Follow up with your primary care doctor at the [facility] at [facility]    2. Continue taking your medications as listed in the discharge summary   3. Stop drinking alcohol, that includes all liquor, wine, and beer.
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
## 2020-11-14 NOTE — Plan of Care (Signed)
  Problem: Education: Goal: Knowledge of General Education information will improve Description: Including pain rating scale, medication(s)/side effects and non-pharmacologic comfort measures Outcome: Not Progressing   Problem: Health Behavior/Discharge Planning: Goal: Ability to manage health-related needs will improve Outcome: Not Progressing   Problem: Clinical Measurements: Goal: Ability to maintain clinical measurements within normal limits will improve Outcome: Not Progressing Goal: Will remain free from infection Outcome: Not Progressing Goal: Diagnostic test results will improve Outcome: Not Progressing Goal: Respiratory complications will improve Outcome: Not Progressing Goal: Cardiovascular complication will be avoided Outcome: Not Progressing   Problem: Activity: Goal: Risk for activity intolerance will decrease Outcome: Not Progressing   Problem: Nutrition: Goal: Adequate nutrition will be maintained Outcome: Not Progressing   Problem: Coping: Goal: Level of anxiety will decrease Outcome: Not Progressing   Problem: Elimination: Goal: Will not experience complications related to bowel motility Outcome: Not Progressing Goal: Will not experience complications related to urinary retention Outcome: Not Progressing   Problem: Pain Managment: Goal: General experience of comfort will improve Outcome: Not Progressing   Problem: Safety: Goal: Ability to remain free from injury will improve Outcome: Not Progressing   Problem: Skin Integrity: Goal: Risk for impaired skin integrity will decrease Outcome: Not Progressing   Problem: Activity: Goal: Ability to tolerate increased activity will improve Outcome: Not Progressing   Problem: Clinical Measurements: Goal: Ability to maintain a body temperature in the normal range will improve Outcome: Not Progressing   Problem: Respiratory: Goal: Ability to maintain adequate ventilation will improve Outcome: Not  Progressing Goal: Ability to maintain a clear airway will improve Outcome: Not Progressing   

## 2020-11-15 MED ORDER — LACTATED RINGERS IV SOLN: INTRAVENOUS | Status: AC

## 2020-11-15 MED ORDER — LACTATED RINGERS IV BOLUS
500.0000 mL | Freq: Once | INTRAVENOUS | Status: AC
  Administered 2020-11-15: 500 mL via INTRAVENOUS

## 2020-11-15 NOTE — TOC Progression Note (Addendum)
Transition of Care [facility]) - Progression Note    Patient Details  Name: [name] MRN: [id number] Date of Birth: July 01, 1966  Transition of [facility]) CM/SW Contact  Joanne Chars, LCSW Phone Number: 11/15/2020, 1:54 PM  Clinical Narrative: CSW met with pt regarding recommendation for SNF.  Pt mother also in room, [place].  Permission given to speak with mother.  Discussed need for SNF, mother encouraging pt to go.  Pt asked questions about length of stay, continues to be difficult to understand, but agrees SNF is needed, permission given to send out referral.  Choice document provided.   Pt referral sent out in hub for SNF.    1615: Level 2 passr docs uploaded    Expected Discharge Plan: Home/Self Care Barriers to Discharge: Continued Medical Work up  Expected Discharge Plan and Services Expected Discharge Plan: Home/Self Care In-house Referral: Clinical Social Work   Post Acute Care Choice: NA Living arrangements for the past 2 months: Single Family Home                 DME Arranged: N/A         HH Arranged: NA           Social Determinants of Health (SDOH) Interventions    Readmission Risk Interventions No flowsheet data found.

## 2020-11-15 NOTE — Plan of Care (Signed)
  Problem: Clinical Measurements: Goal: Respiratory complications will improve Outcome: Progressing   Problem: Clinical Measurements: Goal: Cardiovascular complication will be avoided Outcome: Progressing   Problem: Coping: Goal: Level of anxiety will decrease Outcome: Progressing   

## 2020-11-15 NOTE — Progress Notes (Signed)
Subjective:  Patient states he is feeling better this morning, in that he is not sore anywhere. Realizes that he is slurring his words more than usual and unsure as to why he is doing this. Does not report difficulty breathing or swallowing. Acknowledges that he feels better also because he has not been drinking alcohol. States he wants to go home and discussed importance of going to rehab to get stronger prior to going home. Patient reports he was able to walk with physical therapy yesterday.   Objective:  Vital signs in last 24 hours: Vitals:   11/14/20 1627 11/14/20 2000 11/15/20 0100 11/15/20 0400  BP: (!) 126/92 (!) 144/85 126/84 118/89  Pulse: (!) 106 (!) 108 (!) 106   Resp: (!) 22 20 20 18   Temp:  98.9 F (37.2 C) 97.8 F (36.6 C) 97.9 F (36.6 C)  TempSrc:  Oral Oral Oral  SpO2: 95% 93% 92%   Weight:      Height:       Weight change:   Intake/Output Summary (Last 24 hours) at 11/15/2020 1215 Last data filed at 11/15/2020 4193 Gross per 24 hour  Intake --  Output 350 ml  Net -350 ml    Physical Exam  Constitutional:      General: Sleeping upon team's arrival to the room. Patient awakened from sleep. Appears sleepy during exam and conversation with team. Slurring words, but improved from prior exams.     Appearance: Mouth appears dry and appears tired.      Comments: Answers questions appropriately with slurred speech.  HEENT: Mouth appears dry. Presence of white plaque on the middle and back of the tongue.  Cardiovascular:     Rate and Rhythm: Regular rhythm and regular rate.  Pulmonary:     Effort: Pulmonary effort is normal.     Breath sounds: Mild wheezing present in bibasilar fields. Upper lung fields normal breath sounds.  Abdominal:     Palpations: Abdomen is soft. Abdomen is tympanic to percussion.     Tenderness: There is no abdominal tenderness. Skin: Skin turgor is decreased. Presence of white scale on feet and dystrophic toenails.   Neurological: Patient is alert and oriented x 3    Assessment/Plan:  Principal Problem:   Alcohol withdrawal syndrome ([place]) Active Problems:   Tachycardia   Low Volume Hemoptysis   Aspiration pneumonia (HCC)   Dehydration   Hyperbilirubinemia  [name] is a 55 year old African-American gentleman with medical history significant for alcohol use disorder, hypertension, depression, CVA, macrocytic anemia, gait ataxia, compensated liver cirrhosis here for management of acute alcohol withdrawal, aspiration pneumonia, and dehydration.  #Alcohol Use Disorder Acute Alcohol Withdrawal CIWA scores between 0-4 for past 48 hours without Ativan. Continues to have persistent tachycardia, which was present during past admission for acute alcohol withdrawal. Tachycardia could be 2/2 dehydration as well. Pulse has been within normal limits in outpatient setting.   Patient continues to have slurred speech, but is alert and oriented. According to his fiance, his speech is not at baseline, but is much improved from the beginning of the admission. He understands his situation and is able to engage in conversation with the team and fiance. He acknowledges alcohol is the cause of his admission and the etiology of disease processes. He has not required Ativan since 5/13.   -Physical therapy recommended SNF for discharge due to decreased range of motion, decrease balance, and impaired mobility. Awaiting placement.  -CIWA protocol with Ativan, but has not required Ativan since  5/13    #Aspiration pneumonia Completed total 5 day course on 5/13:Unasyn (3 days) and transitioned to Augmentin (2 days).    -Patient afebrile, with no oxygen requirement, improved pulmonary exam, and no evidence of respiratory distress.  #Dehydration Patient has had poor PO intake and fluids were discontinued due to abdominal distention and increasing ascites on 5/11. However, on exam today he appears to be volume down with  decreased skin turgor, dry mucous membranes, and would benefit from fluid resuscitation.   -IV LR 500 ml bolus  -followed by IV LR 100 ml/hr as maintenance fluids  -encourage PO intake of fluids   #Compensated liver cirrhosis #Ascites  T bili and liver enzymes within normal limits  Developed distention of abdomen most likely due to continuous IVF resulting in increasing ascites.   -Abdomen is soft and nontender to palpation with tympany to percussion. Patient has been having regular bowel movements. -Will continue to monitor exam and frequency of bowel movements.    LOS: 7 days   [name], Medical Student 11/15/2020, 7:40 AM  Pager: [phone] After 5pm on weekdays and 1pm on weekends: On Call pager: [phone]

## 2020-11-15 NOTE — NC FL2 (Addendum)
[place] LEVEL OF CARE SCREENING TOOL     IDENTIFICATION  Patient Name: [name] Birthdate: 03/02/1966 Sex: male Admission Date (Current Location): 11/08/2020  [place] and [place] Number:  Kathleen Argue [phone] [place] and Address:  [facility] [place] [address], [place], [place] [address]      Provider Number: [id number]  Attending Physician Name and Address:  Angelica Pou, MD  Relative Name and Phone Number:  Lynita Lombard Mother [phone]    Current Level of Care: Hospital Recommended Level of Care: [place] Prior Approval Number:    Date Approved/Denied:   PASRR Number:   [id number] E expires 12/15/20  Discharge Plan: SNF    Current Diagnoses: Patient Active Problem List   Diagnosis Date Noted  . Alcohol withdrawal syndrome ([place]) 11/08/2020  . Low Volume Hemoptysis 11/08/2020  . Aspiration pneumonia ([place]) 11/08/2020  . Dehydration 11/08/2020  . Hyperbilirubinemia 11/08/2020  . Tachycardia 10/06/2020  . Generalized weakness 10/06/2020  . Syncope 09/27/2020  . Vitamin D deficiency 07/06/2020  . Right hip pain 07/02/2020  . Alcohol abuse 02/13/2020  . Elevated serum GGT level 11/06/2019  . Paresthesia 05/15/2019  . Pain in both lower extremities 05/15/2019  . Functional incontinence 04/14/2019  . Gait abnormality 04/14/2019  . 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 Anemia 03/14/2018  . Left leg pain 01/21/2017  . Essential hypertension 01/21/2017  . MDD (major depressive disorder) 03/25/2010  . ERECTILE DYSFUNCTION 05/14/2008  . GERD 04/13/2008  . TOBACCO ABUSE 03/11/2008    Orientation RESPIRATION BLADDER Height & Weight     Self  Normal Incontinent Weight: 151 lb 0.2 oz (68.5 kg) Height:  5\' 7"  (170.2 cm)  BEHAVIORAL SYMPTOMS/MOOD NEUROLOGICAL BOWEL NUTRITION STATUS      Incontinent Diet (See discharge summary)  AMBULATORY STATUS COMMUNICATION OF NEEDS Skin   Total Care Verbally Normal                        Personal Care Assistance Level of Assistance  Bathing,Feeding,Dressing Bathing Assistance: Maximum assistance Feeding assistance: Limited assistance Dressing Assistance: Maximum assistance     Functional Limitations Info  Sight,Hearing,Speech Sight Info: Adequate Hearing Info: Adequate Speech Info: Impaired    SPECIAL CARE FACTORS FREQUENCY  PT (By licensed PT),OT (By licensed OT)     PT Frequency: 5x week OT Frequency: 5x week            Contractures Contractures Info: Not present    Additional Factors Info  Code Status,Allergies Code Status Info: Full Allergies Info: NKA           Current Medications (11/15/2020):  This is the current hospital active medication list Current Facility-Administered Medications  Medication Dose Route Frequency Provider Last Rate Last Admin  . acetaminophen (TYLENOL) tablet 650 mg  650 mg Oral Q6H PRN Jean Rosenthal, MD   650 mg at 11/08/20 2035   Or  . acetaminophen (TYLENOL) suppository 650 mg  650 mg Rectal Q6H PRN Agyei, Obed K, MD      . atorvastatin (LIPITOR) tablet 40 mg  40 mg Oral Daily Agyei, Obed K, MD   40 mg at 11/15/20 0925  . DULoxetine (CYMBALTA) DR capsule 60 mg  60 mg Oral Daily Jean Rosenthal, MD   60 mg at 11/15/20 0925  . enoxaparin (LOVENOX) injection 40 mg  40 mg Subcutaneous Q24H Agyei, Obed K, MD   40 mg at 32/67/12 4580  . folic acid (FOLVITE) tablet 1 mg  1 mg Oral Daily Jean Rosenthal, MD   1 mg at 11/15/20 0925  . lactated ringers infusion   Intravenous Continuous [name], MD      . multivitamin with minerals tablet 1 tablet  1 tablet Oral Daily Jean Rosenthal, MD   1 tablet at 11/15/20 0925  . pneumococcal 23 valent vaccine (PNEUMOVAX-23) injection 0.5 mL  0.5 mL Intramuscular Tomorrow-1000 Joni Reining C, DO      . thiamine tablet 100 mg  100 mg Oral Daily Agyei, Obed K, MD   100 mg at 11/15/20 2694   Or  . thiamine (B-1) injection 100 mg  100 mg Intravenous Daily Jean Rosenthal, MD          Discharge Medications: Please see discharge summary for a list of discharge medications.  Relevant Imaging Results:  Relevant Lab Results:   Additional Information SSN: [id number]  Joanne Chars, LCSW

## 2020-11-15 NOTE — Progress Notes (Signed)
   RE:   [name]      Date of Birth: 11-May-2066      Date:  11/15/20        To Whom It May Concern:  Please be advised that the above-named patient will require a short-term nursing home stay - anticipated 30 days or less for rehabilitation and strengthening.  The plan is for return home.                 MD signature                Date

## 2020-11-15 NOTE — Plan of Care (Signed)
  Problem: Pain Managment: Goal: General experience of comfort will improve Outcome: Progressing   Problem: Safety: Goal: Ability to remain free from injury will improve Outcome: Progressing   Problem: Clinical Measurements: Goal: Ability to maintain a body temperature in the normal range will improve Outcome: Progressing   Problem: Respiratory: Goal: Ability to maintain adequate ventilation will improve Outcome: Progressing

## 2020-11-16 ENCOUNTER — Inpatient Hospital Stay (HOSPITAL_COMMUNITY): Payer: Medicaid Other

## 2020-11-16 ENCOUNTER — Ambulatory Visit: Payer: Medicaid Other | Admitting: Behavioral Health

## 2020-11-16 DIAGNOSIS — J189 Pneumonia, unspecified organism: Secondary | ICD-10-CM

## 2020-11-16 NOTE — Plan of Care (Signed)
  Problem: Skin Integrity: Goal: Risk for impaired skin integrity will decrease Outcome: Progressing   Problem: Clinical Measurements: Goal: Ability to maintain a body temperature in the normal range will improve Outcome: Progressing   Problem: Respiratory: Goal: Ability to maintain adequate ventilation will improve Outcome: Progressing   Problem: Respiratory: Goal: Ability to maintain a clear airway will improve Outcome: Progressing

## 2020-11-16 NOTE — Progress Notes (Signed)
Subjective:  Overnight patient pulled out IV. Per patient's nurse, patient was agitated overnight and was trying to get out of bed. This morning he is comfortably sleeping.   Team went to see patient this afternoon. Speech remains dysarthric and slurred. Patient alert and oriented x 3, but thought he was in the hospital for a stroke. Stating he no longer wants to be at the hospital and would like to be outside and get fresh air. Reports a dull headache that starts in the front of his head and wraps around the sides. This headache has been off and on for the past two weeks. Reports some abdominal pain and states he cannot urinate today. Denies chest pain or shortness of breath. The patient states his slurred speech has been present since the end of March of 2022.   Objective:  Vital signs in last 24 hours: Vitals:   11/16/20 0329 11/16/20 0419 11/16/20 0720 11/16/20 0825  BP:  (!) 143/72  (!) 145/93  Pulse:  97  91  Resp: (!) 23 (!) 27 10 20   Temp:  98 F (36.7 C)  98.7 F (37.1 C)  TempSrc:  Oral  Axillary  SpO2:  99% 94%   Weight:      Height:       Weight change:   Intake/Output Summary (Last 24 hours) at 11/16/2020 1605 Last data filed at 11/16/2020 1013 Gross per 24 hour  Intake 649 ml  Output --  Net 649 ml    Physical Exam  Constitutional:      General: Patient appears tired during exam. Able to converse with team, but continues to have slurred speech.  HEENT: Pupils equal and reactive to light. Extraocular movements intact. Lips appears dry and some dry skin around the lips.  Cardiovascular:     Rate and Rhythm: Regular rhythm and tachycardic .  Pulmonary:     Effort: Pulmonary effort is normal.  Abdominal:     Palpations: Abdomen is soft. Abdomen is tympanic to percussion.     Tenderness: There diffuse abdominal tenderness to deep palpation. Skin: Skin turgor on lower extremities is decreased.  Neurological: Patient is alert and oriented x 3.  Facial  sensation intact bilaterally in V1-V3 distribution. Facial muscles intact bilaterally. No asymmetry.  Decreased sensation to touch on left upper extremity and left lower extremity.  Decreased muscle strength (3/5) in left upper extremity and left lower extremity in comparison to right upper and lower extremities (5/5).   Assessment/Plan:  Principal Problem:   Alcohol withdrawal syndrome (HCC) Active Problems:   Tachycardia   Low Volume Hemoptysis   Aspiration pneumonia (HCC)   Dehydration   Hyperbilirubinemia  [name] is a 55 year old African-American gentleman with medical history significant for alcohol use disorder, hypertension, depression, CVA, macrocytic anemia, gait ataxia, compensated liver cirrhosis here for management of acute alcohol withdrawal, aspiration pneumonia, and dehydration.  #Alcohol Use Disorder Acute Alcohol Withdrawal CIWA scores between 0-4 for greater than 48 hours without Ativan. Continues to have persistent tachycardia, which was present during past admission for acute alcohol withdrawal.   -Physical therapy recommended SNF for discharge due to decreased range of motion, decrease balance, and impaired mobility. Awaiting placement.  -CIWA protocol with Ativan, but has not required Ativan since 5/13   #Dysarthria  Left sided weakness and decreased sensation  Patient continues to have dysarthria along with left sided deficits on physical exam. According to chart review, patient has a fall at the end of 08/2020. CT head from  09/28/2020 showed area of prior ischemia in the left thalamus and deep white matter on the right are again noted and stable. MRI from 11/08/2020 was unremarkable. Will order non-contrast CT head to evaluate for subdural hematoma given history of fall and new neurological findings. This could also be long lasting effects of librium given its long half life. Received 75 mg on 5/9, 150 mg on 5/10, and 50 mg on 5/11 and was discontinued after this.    -Follow up CT head   #Dehydration Patient has had poor PO intake and fluids were discontinued due to abdominal distention and increasing ascites on 5/11. Given 500 ml bolus + continuous fluids on 5/16. Continues to have decreased skin turgor on exam, but will hold off on giving fluids given that patient is reporting urinary retention.   -Follow up bladder scan  -encourage PO intake of fluids   #Aspiration pneumonia Completed total 5 day course on 5/13:Unasyn (3 days) and transitioned to Augmentin (2 days).    -Patient afebrile, with no oxygen requirement, improved pulmonary exam, and no evidence of respiratory distress.  #Compensated liver cirrhosis #Ascites  T bili and liver enzymes within normal limits  Developed distention of abdomen most likely due to continuous IVF resulting in increasing ascites.   -Check CMP every 3 days to monitor bili and liver enzymes.     LOS: 8 days   Jacqlyn Larsen, Medical Student 11/16/2020, 4:01 PM  Pager: [phone] After 5pm on weekdays and 1pm on weekends: On Call pager: [phone]

## 2020-11-16 NOTE — Plan of Care (Signed)
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
## 2020-11-17 LAB — BASIC METABOLIC PANEL
Anion gap: 8 (ref 5–15)
BUN: 5 mg/dL — ABNORMAL LOW (ref 6–20)
CO2: 29 mmol/L (ref 22–32)
Calcium: 8.9 mg/dL (ref 8.9–10.3)
Chloride: 99 mmol/L (ref 98–111)
Creatinine, Ser: 0.54 mg/dL — ABNORMAL LOW (ref 0.61–1.24)
GFR, Estimated: >60 mL/min (ref >60)
Glucose, Bld: 90 mg/dL (ref 70–99)
Potassium: 3.5 mmol/L (ref 3.5–5.1)
Sodium: 136 mmol/L (ref 135–145)

## 2020-11-17 LAB — SARS CORONAVIRUS 2 (TAT 6-24 HRS): SARS Coronavirus 2: NEGATIVE

## 2020-11-17 LAB — GLUCOSE, CAPILLARY: Glucose-Capillary: 135 mg/dL — ABNORMAL HIGH (ref 70–99)

## 2020-11-17 LAB — RPR: RPR Ser Ql: NONREACTIVE

## 2020-11-17 MED ORDER — LACTATED RINGERS IV SOLN: INTRAVENOUS | Status: AC

## 2020-11-17 MED ORDER — LACTATED RINGERS IV BOLUS
1000.0000 mL | Freq: Once | INTRAVENOUS | Status: AC
  Administered 2020-11-17: 1000 mL via INTRAVENOUS

## 2020-11-17 MED ORDER — ASPIRIN-ACETAMINOPHEN-CAFFEINE 250-250-65 MG PO TABS
1.0000 | ORAL_TABLET | Freq: Four times a day (QID) | ORAL | Status: DC | PRN
  Filled 2020-11-17: qty 1

## 2020-11-17 MED ORDER — SENNA 8.6 MG PO TABS
1.0000 | ORAL_TABLET | Freq: Every day | ORAL | Status: DC | PRN
  Administered 2020-11-19 – 2020-11-21 (×2): 8.6 mg via ORAL
  Filled 2020-11-17 (×2): qty 1

## 2020-11-17 MED ORDER — POLYETHYLENE GLYCOL 3350 17 G PO PACK
17.0000 g | PACK | Freq: Every day | ORAL | Status: DC | PRN
  Administered 2020-11-17 – 2020-11-21 (×3): 17 g via ORAL
  Filled 2020-11-17 (×3): qty 1
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
## 2020-11-17 NOTE — TOC Progression Note (Signed)
Transition of Care [facility]) - Progression Note    Patient Details  Name: Sheriff Rodenberg MRN: [id number] Date of Birth: 1965-08-09  Transition of Care [facility]) CM/SW Contact  Joanne Chars, LCSW Phone Number: 11/17/2020, 4:07 PM  Clinical Narrative:   CSW spoke with pt in room and with pt mother on the phone about bed offers: [place], [place].  They decided to choose [facility].  Message left with Teena at [facility] to initiate [place]      Expected Discharge Plan: Home/Self Care Barriers to Discharge: Continued Medical Work up  Expected Discharge Plan and Services Expected Discharge Plan: Home/Self Care In-house Referral: Clinical Social Work   Post Acute Care Choice: NA Living arrangements for the past 2 months: Single Family Home                 DME Arranged: N/A         HH Arranged: NA           Social Determinants of Health (SDOH) Interventions    Readmission Risk Interventions No flowsheet data found.

## 2020-11-18 DIAGNOSIS — F10230 Alcohol dependence with withdrawal, uncomplicated: Secondary | ICD-10-CM | POA: Diagnosis not present

## 2020-11-18 LAB — GLUCOSE, CAPILLARY: Glucose-Capillary: 108 mg/dL — ABNORMAL HIGH (ref 70–99)

## 2020-11-18 MED ORDER — ASPIRIN-ACETAMINOPHEN-CAFFEINE 250-250-65 MG PO TABS: 1.0000 | ORAL_TABLET | Freq: Four times a day (QID) | ORAL | 0 refills | Status: DC | PRN
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
## 2020-11-18 NOTE — Plan of Care (Signed)
  Problem: Education: Goal: Knowledge of General Education information will improve Description: Including pain rating scale, medication(s)/side effects and non-pharmacologic comfort measures Outcome: Progressing   Problem: Safety: Goal: Ability to remain free from injury will improve Outcome: Progressing   Problem: Skin Integrity: Goal: Risk for impaired skin integrity will decrease Outcome: Not Progressing  Skin breakdown on penis and thighs from moisture. Barrier cream applied.
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
## 2020-11-18 NOTE — TOC Progression Note (Signed)
Transition of Care [facility]) - Progression Note    Patient Details  Name: [name] MRN: [id number] Date of Birth: 1966/03/27  Transition of Care [facility]) CM/SW [place], LCSW Phone Number: 11/18/2020, 10:07 AM  Clinical Narrative:    CSW spoke with Lorenso Courier at [facility] She will let CSW know once she has Occupational hygienist.    Expected Discharge Plan: Home/Self Care Barriers to Discharge: Continued Medical Work up  Expected Discharge Plan and Services Expected Discharge Plan: Home/Self Care In-house Referral: Clinical Social Work   Post Acute Care Choice: NA Living arrangements for the past 2 months: Single Family Home                 DME Arranged: N/A         HH Arranged: NA           Social Determinants of Health (SDOH) Interventions    Readmission Risk Interventions No flowsheet data found.

## 2020-11-19 ENCOUNTER — Encounter: Payer: Medicaid Other | Admitting: Internal Medicine

## 2020-11-19 NOTE — TOC Progression Note (Addendum)
Transition of Care [facility]) - Progression Note    Patient Details  Name: [name] MRN: [id number] Date of Birth: [birth year]  Transition of Care [facility]) CM/SW Contact  Joanne Chars, LCSW Phone Number: 11/19/2020, 10:30 AM  Clinical Narrative:  CSW received update from [place] at [facility].  Authorization is not yet approved.  She would like to admit pt today if it does come back later today, but otherwise weekend admission is also possible.    1230: TC Lorenso Courier, [place].  Needs updated clinicals faxed for authorization, which was done.      Expected Discharge Plan: Home/Self Care Barriers to Discharge: Continued Medical Work up  Expected Discharge Plan and Services Expected Discharge Plan: Home/Self Care In-house Referral: Clinical Social Work   Post Acute Care Choice: NA Living arrangements for the past 2 months: Single Family Home Expected Discharge Date: 11/14/20               DME Arranged: N/A         HH Arranged: NA           Social Determinants of Health (SDOH) Interventions    Readmission Risk Interventions No flowsheet data found.
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
## 2020-11-20 NOTE — Progress Notes (Signed)
Patient has been persistently trying to get out of bed, chair, and bedside commode without assistance. Patient has been educated multiple times that his gait is not steady, he is very weak, and he will fall without staff assistance. Patient demonstrated that he is knowledgeable on how to use the call bell.

## 2020-11-20 NOTE — Progress Notes (Signed)
Patient was adamant that he needed to be discharged. Advised patient that the discharge plan in place for him is to discharge to nursing facility. Advised patient that 24 hour care is required. Patient advised me that "my fiance and son will care for me around the clock". I asked patient if I could speak with his family to confirm, called fiance Hassan Rowan who advised me that patient called her and said that "the hospital staff told me I have to leave because they need the bed". I advised Hassan Rowan that this was absolutely not the case and at no time was patient advised that he needed to leave the hospital. Spoke with Mr. Lierman and informed him of conversation with Hassan Rowan, he confirmed statements made to [place], and said "well don't yall need beds?". Patient has agreed to wait for [facility] approval at this time.

## 2020-11-20 NOTE — TOC Progression Note (Signed)
Transition of Care [facility]) - Progression Note    Patient Details  Name: [name] MRN: [id number] Date of Birth: April 24, 1966  Transition of Care [facility]) CM/SW [place], [place] Phone Number: 11/20/2020, 1:13 PM  Clinical Narrative:    CSW attempted to determine if auth had been received at the facility. They noted there were no admissions available over the weekend. CSW was also made aware by MD that Pt had recently experienced a death in the family and was wanting more information about being able to go home before going to SNF. CSW contacted pt and expressed condolences for his loss and advised him that SNF's do not accept unless it is directly from the hospital, but he might be able to take a day away from the facility for the funeral. Pt expressed frustration and stated that his family has experienced a lot of loss recently and that now may not be a good time for rehab. He stated he would need to think about whether or not he can go. He was made aware that we do not have auth yet and to keep [place] updated about his decision. SW will continue to follow for DC planning.   Expected Discharge Plan: Home/Self Care Barriers to Discharge: Continued Medical Work up  Expected Discharge Plan and Services Expected Discharge Plan: Home/Self Care In-house Referral: Clinical Social Work   Post Acute Care Choice: NA Living arrangements for the past 2 months: Single Family Home Expected Discharge Date: 11/14/20               DME Arranged: N/A         HH Arranged: NA           Social Determinants of Health (SDOH) Interventions    Readmission Risk Interventions No flowsheet data found.
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
## 2020-11-21 NOTE — TOC Progression Note (Signed)
Transition of Care [facility]) - Progression Note    Patient Details  Name: Laray Corbit MRN: [id number] Date of Birth: March 30, 1966  Transition of Care [facility]) CM/SW [place], [place] Phone Number: 11/21/2020, 1:08 PM  Clinical Narrative:   CSW reached out to [place] to ask if they have received insurance authorization yet, awaiting response. CSW updated MD.     Expected Discharge Plan: Home/Self Care Barriers to Discharge: Continued Medical Work up  Expected Discharge Plan and Services Expected Discharge Plan: Home/Self Care In-house Referral: Clinical Social Work   Post Acute Care Choice: NA Living arrangements for the past 2 months: Single Family Home Expected Discharge Date: 11/14/20               DME Arranged: N/A         HH Arranged: NA           Social Determinants of Health (SDOH) Interventions    Readmission Risk Interventions No flowsheet data found.
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
## 2020-11-22 LAB — SARS CORONAVIRUS 2 (TAT 6-24 HRS): SARS Coronavirus 2: NEGATIVE

## 2020-11-22 NOTE — Progress Notes (Addendum)
[facility] called and results provided by this nurse for Covid swab-Pt will be going to [facility]. [facility] called to request pick up for patient.  Recalled Primus Bravo to provide report on pt-unable to reach anyone by phone on number [phone] nor [phone].

## 2020-11-22 NOTE — TOC Progression Note (Addendum)
Transition of Care [facility]) - Progression Note    Patient Details  Name: [name] MRN: [id number] Date of Birth: August 08, 1965  Transition of Care [facility]) CM/SW Contact  Joanne Chars, LCSW Phone Number: 11/22/2020, 3:33 PM  Clinical Narrative:   CSW received message from [place] at [facility] that they do have auth for this pt.  New covid test is needed.  CSW paged residents, they will order covid test.   1500: Covid test still pending.  DC summary placed on hub for [facility].  RN calling lab to get ETA for covid result.  Covid ordered was not a rapid test.    1550: No update from lab.  CSW spoke to Dr Konrad Penta, updated her.  Unable to reach [place] to see how late pt can arrive.    Expected Discharge Plan: Home/Self Care Barriers to Discharge: Continued Medical Work up  Expected Discharge Plan and Services Expected Discharge Plan: Home/Self Care In-house Referral: Clinical Social Work   Post Acute Care Choice: NA Living arrangements for the past 2 months: Single Family Home Expected Discharge Date: 11/14/20               DME Arranged: N/A         HH Arranged: NA           Social Determinants of Health (SDOH) Interventions    Readmission Risk Interventions No flowsheet data found.

## 2020-11-22 NOTE — Progress Notes (Signed)
Recalled both numbers at [facility] for report. No answer.

## 2020-11-22 NOTE — TOC Transition Note (Signed)
Transition of Care [facility]) - CM/SW Discharge Note   Patient Details  Name: [name] MRN: [id number] Date of Birth: 08-15-1965  Transition of Care [facility]) CM/SW Contact:  Joanne Chars, LCSW Phone Number: 11/22/2020, 4:10 PM   Clinical Narrative:   Pt accepted to [facility].  Covid test result still pending and must be back prior to transport.  RN please call [place] at [phone] both to give report and to confirm it is not too late to transport pt.  If pt can go tonight, RN please call [facility] and tell them that pt is ready for transport.  Pt information has already been given to [facility], they just need to be told that pt is ready.      Final next level of care: Skilled Nursing Facility Barriers to Discharge: Other (must enter comment) (still pending covid result)   Patient Goals and CMS Choice Patient states their goals for this hospitalization and ongoing recovery are:: stop drinking CMS Medicare.gov Compare Post Acute Care list provided to::  (na)    Discharge Placement              Patient chooses bed at:  [facility]) Patient to be transferred to facility by: [place] Name of family member notified: Peter Congo, Mother Patient and family notified of of transfer: 11/22/20  Discharge Plan and Services In-house Referral: Clinical Social Work   Post Acute Care Choice: NA          DME Arranged: N/A         HH Arranged: NA          Social Determinants of Health (SDOH) Interventions     Readmission Risk Interventions No flowsheet data found.
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
## 2020-11-30 ENCOUNTER — Ambulatory Visit: Payer: Medicaid Other | Admitting: Behavioral Health

## 2020-11-30 ENCOUNTER — Other Ambulatory Visit: Payer: Self-pay

## 2020-11-30 DIAGNOSIS — F419 Anxiety disorder, unspecified: Secondary | ICD-10-CM

## 2020-11-30 DIAGNOSIS — F331 Major depressive disorder, recurrent, moderate: Secondary | ICD-10-CM

## 2020-11-30 NOTE — BH Specialist Note (Signed)
Integrated Behavioral Health via Telemedicine Visit  11/30/2020 [name] [id number]  Number of [place] visits: 6/6 Session Start time: 1:00pm  Session End time: 1:30pm Total time: 30  Referring Provider: Dr. Morrison Old, MD Patient/Family location: Pt @ home in private [facility] Provider location: [facility] Office All persons participating in visit: Pt & Clinician Types of Service: Health Promotion  I connected with [name] and/or [name] self via  Telephone or Geologist, engineering  (Video is Tree surgeon) and verified that I am speaking with the correct person using two identifiers. Discussed confidentiality: Yes   I discussed the limitations of telemedicine and the availability of in person appointments.  Discussed there is a possibility of technology failure and discussed alternative modes of communication if that failure occurs.  I discussed that engaging in this telemedicine visit, they consent to the provision of behavioral healthcare and the services will be billed under their insurance.  Patient and/or legal guardian expressed understanding and consented to Telemedicine visit: Yes   Presenting Concerns: Patient and/or family reports the following symptoms/concerns: concern for friend who did not show, his health status changes recently Duration of problem: several yrs; Severity of problem: moderate  Patient and/or Family's Strengths/Protective Factors: Social connections and Concrete supports in place (healthy food, safe environments, etc.)  Goals Addressed: Patient will: 1.  Reduce symptoms of: depression and stress  2.  Increase knowledge and/or ability of: coping skills and healthy habits  3.  Demonstrate ability to: Increase healthy adjustment to current life circumstances  Progress towards Goals: Ongoing  Interventions: Interventions utilized:  Supportive Counseling and Cslg for recovery support/relapse  prevention Standardized Assessments completed: screeners prn  Patient and/or Family Response: Pt receptive to call & requests future appts  Assessment: Patient currently experiencing elevated dep levels due to recent health issues & probability of cirrhosis onset.   Patient may benefit from cont'd support for mental health needs, & family situation.  Plan: 1. Follow up with behavioral health clinician on : 2-3 wks on telehealth for 30 min ck-ins 2. Behavioral recommendations: cont your efforts w/ETOH moderation & care for health 3. Referral(s): [employer] (In Clinic)  I discussed the assessment and treatment plan with the patient and/or parent/guardian. They were provided an opportunity to ask questions and all were answered. They agreed with the plan and demonstrated an understanding of the instructions.   They were advised to call back or seek an in-person evaluation if the symptoms worsen or if the condition fails to improve as anticipated.  Donnetta Hutching, LMFT

## 2020-12-21 ENCOUNTER — Ambulatory Visit: Payer: Medicaid Other | Admitting: Behavioral Health

## 2020-12-21 DIAGNOSIS — F419 Anxiety disorder, unspecified: Secondary | ICD-10-CM

## 2020-12-21 DIAGNOSIS — Z8659 Personal history of other mental and behavioral disorders: Secondary | ICD-10-CM

## 2020-12-21 DIAGNOSIS — F3341 Major depressive disorder, recurrent, in partial remission: Secondary | ICD-10-CM

## 2020-12-21 NOTE — BH Specialist Note (Signed)
Integrated Behavioral Health via Telemedicine Visit  12/21/2020 [name] [id number]  Number of [name]: 7 Session Start time: 2:00pm  Session End time: 2:30pm Total time: 30  Referring Provider: Dr. Morrison Old, MD Patient/Family location: [place] is [place] m[place] w/his LT GF & needs to have future appt; today he reports things are going well. He is smoking more & drinking less. He has leaned into G_d for help & used the Bible as a resource.  [facility] Provider location: [facility] Office All persons participating in visit: Pt & Clinician Types of Service: [place] Assessment/Intervention  I connected with [name] and/or [name]  self  via  Telephone or Geologist, engineering  (Video is Tree surgeon) and verified that I am speaking with the correct person using two identifiers. Discussed confidentiality: Yes   I discussed the limitations of telemedicine and the availability of in person appointments.  Discussed there is a possibility of technology failure and discussed alternative modes of communication if that failure occurs.  I discussed that engaging in this telemedicine visit, they consent to the provision of behavioral healthcare and the services will be billed under their insurance.  Patient and/or legal guardian expressed understanding and consented to Telemedicine visit: Yes   Presenting Concerns: Patient and/or family reports the following symptoms/concerns: Pt has just cut his dredlocks to his shoulders from the bottom of his back. He has had dreds for 15 yrs & feels he looks older. Father's Day was good. Pt's voice cadence & tone presents slurred Duration of problem: yrs; Severity of problem: moderate  Patient and/or Family's Strengths/Protective Factors: Social connections, Social and Emotional competence, and Concrete supports in place (healthy food, safe environments, etc.)  Goals Addressed: Patient  will:  Reduce symptoms of: anxiety, depression, and stress   Increase knowledge and/or ability of: coping skills, healthy habits, and stress reduction   Demonstrate ability to: Increase healthy adjustment to current life circumstances and Increase motivation to adhere to plan of care  Progress towards Goals: Ongoing  Interventions: Interventions utilized:  Behavioral Activation and Supportive Counseling Standardized Assessments completed:  screeners prn  Patient and/or Family Response: Pt receptive to call today, but kept it short. Pt reassuring Clinician he is doing better, but has to terminate the call.   Assessment: Patient currently experiencing reduced consumption of ETOH w/inc in smoking. Pt has not attended AA/NA [facility] for peer support, but he has found his own support resources.  Patient may benefit from cont'd ck-ins with Recovery Support/Relapse Prevention efforts to assist Pt.  Plan: Follow up with behavioral health clinician on : 2-3 wks for 30 min ck-in on telehealth Behavioral recommendations: None @ this time Referral(s): Community Resources:  Support for [employer]  I discussed the assessment and treatment plan with the patient and/or parent/guardian. They were provided an opportunity to ask questions and all were answered. They agreed with the plan and demonstrated an understanding of the instructions.   They were advised to call back or seek an in-person evaluation if the symptoms worsen or if the condition fails to improve as anticipated.  Donnetta Hutching, LMFT

## 2021-01-11 ENCOUNTER — Ambulatory Visit: Payer: Medicaid Other | Admitting: Behavioral Health

## 2021-01-11 DIAGNOSIS — F331 Major depressive disorder, recurrent, moderate: Secondary | ICD-10-CM

## 2021-01-11 DIAGNOSIS — F419 Anxiety disorder, unspecified: Secondary | ICD-10-CM

## 2021-01-11 NOTE — BH Specialist Note (Signed)
Integrated Behavioral Health via Telemedicine Visit  01/11/2021 [name] [id number]  Number of [place] visits: 8 Session Start time: 10:30am  Session End time: 11:00am Total time: 30  Referring Provider: Dr. Morrison Old, MD Patient/Family location: Pt is @ [place] fishing w/his friend [name] [facility] Provider location: [facility] office All persons participating in visit: Pt & Clinician Types of Service: Individual psychotherapy  I connected with [name] and/or [name]  self  via  Telephone or Geologist, engineering  (Video is Tree surgeon) and verified that I am speaking with the correct person using two identifiers. Discussed confidentiality:  8th visit  I discussed the limitations of telemedicine and the availability of in person appointments.  Discussed there is a possibility of technology failure and discussed alternative modes of communication if that failure occurs.  I discussed that engaging in this telemedicine visit, they consent to the provision of behavioral healthcare and the services will be billed under their insurance.  Patient and/or legal guardian expressed understanding and consented to Telemedicine visit:  8th visit  Presenting Concerns: Patient and/or family reports the following symptoms/concerns: fishing w/his friends Duration of problem: 10:30am; Severity of problem: moderate  Patient and/or Family's Strengths/Protective Factors: Social connections, Concrete supports in place (healthy food, safe environments, etc.), and in [place]: Patient will:  Reduce symptoms of: depression and stress   Increase knowledge and/or ability of: healthy habits and stress reduction   Demonstrate ability to: Increase healthy adjustment to current life circumstances and Increase motivation to adhere to plan of care  Progress towards Goals: Ongoing  Interventions: Interventions  utilized:  Behavioral Activation and Supportive Counseling Standardized Assessments completed:  screeners prn  Patient and/or Family Response: Pt receptive to call today. Pt requests future appt for ck-in  Assessment: Patient currently experiencing elevated sense of improved Family relations w/Son & [name]. He is enjoying time w/his friends.  Patient may benefit from cont'd ck-ins.  Plan: Follow up with behavioral health clinician on : 2-3 wks for 30 min ck-in Behavioral recommendations: Reduce your intake of ETOH as able Referral(s): [place] (In Clinic)  I discussed the assessment and treatment plan with the patient and/or parent/guardian. They were provided an opportunity to ask questions and all were answered. They agreed with the plan and demonstrated an understanding of the instructions.   They were advised to call back or seek an in-person evaluation if the symptoms worsen or if the condition fails to improve as anticipated.  Donnetta Hutching, LMFT

## 2021-01-17 ENCOUNTER — Ambulatory Visit (INDEPENDENT_AMBULATORY_CARE_PROVIDER_SITE_OTHER): Payer: Medicaid Other | Admitting: Student

## 2021-01-17 ENCOUNTER — Other Ambulatory Visit (HOSPITAL_COMMUNITY): Payer: Self-pay

## 2021-01-17 ENCOUNTER — Other Ambulatory Visit: Payer: Self-pay

## 2021-01-17 VITALS — BP 140/85 | HR 96 | Temp 98.6°F | Ht 67.0 in | Wt 151.4 lb

## 2021-01-17 DIAGNOSIS — Z0000 Encounter for general adult medical examination without abnormal findings: Secondary | ICD-10-CM | POA: Diagnosis present

## 2021-01-17 DIAGNOSIS — F10930 Alcohol use, unspecified with withdrawal, uncomplicated: Secondary | ICD-10-CM

## 2021-01-17 DIAGNOSIS — F10230 Alcohol dependence with withdrawal, uncomplicated: Secondary | ICD-10-CM

## 2021-01-17 DIAGNOSIS — F1111 Opioid abuse, in remission: Secondary | ICD-10-CM | POA: Diagnosis not present

## 2021-01-17 DIAGNOSIS — N6310 Unspecified lump in the right breast, unspecified quadrant: Secondary | ICD-10-CM | POA: Diagnosis not present

## 2021-01-17 DIAGNOSIS — R9389 Abnormal findings on diagnostic imaging of other specified body structures: Secondary | ICD-10-CM | POA: Diagnosis not present

## 2021-01-17 DIAGNOSIS — N62 Hypertrophy of breast: Secondary | ICD-10-CM | POA: Insufficient documentation

## 2021-01-17 MED ORDER — GABAPENTIN 300 MG PO CAPS
ORAL_CAPSULE | ORAL | 0 refills | Status: DC
  Filled 2021-01-17: qty 90, 84d supply, fill #0

## 2021-01-17 MED ORDER — GABAPENTIN 300 MG PO CAPS: ORAL_CAPSULE | ORAL | 0 refills | Status: DC

## 2021-01-17 NOTE — Assessment & Plan Note (Addendum)
Assessment: Patient with improvement of his alcohol use since prior hospitalization.  Initially patient was discharged to a facility however he left early as he felt as though he would do better at home.  Since, he has quit drinking liquor.  He drinks 6 cans of beer a day.  Patient did attempt to transition to nonalcoholic beer however he stated that it was quite expensive.  He is quite concerned about his overall health, we discussed his liver findings.  Patient mentioned that in the hospital someone had discussed cirrhosis.  Discussed that he has liver changes from his chronic alcohol use however there appears to be no functional damage and therefore he is not cirrhotic.  He states that he would like to quit drinking and that he is ready to officially quit.  Believe patient is candidate for outpatient detox.  We will start him on a gabapentin taper;  Day 1 300 mg every 6 hours  Day 2 300 mg every 8 hours Day 3 300 mg every 12 hours Day 4 300 mg in the evening  300 mg gabapentin for symptomatic therapy  I will call patient daily to check in on him.  We will also plan to have him follow-up in 7 days.  Gave him strict return precautions that if he starts having signs and symptoms of withdrawals that he is unable to control with the gabapentin that he is to emergently go to the closest emergency department or call 911.  Plan: -Within taper for alcohol withdrawal -Continue to follow with Dr. Carolynne Edouard counseling services for substance use -If any severe withdrawal symptoms patient to go to ED  Addendum:  Called patient x 2 with no answer to see how his taper is going. Will try again tomorrow.   Addendum 2: 07/20  Unable to reach patient today, message left.

## 2021-01-17 NOTE — Assessment & Plan Note (Deleted)
Patient due for colonoscopy.  Denies dark tarry stools or bright red blood per rectum.  Will place referral to GI
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
## 2021-01-17 NOTE — Patient Instructions (Signed)
Thank you, [name] for allowing [place] to provide your care today. Today we discussed   Breast Mass We have ordered an ultrasound to assess the painful spot behind your right breast. I will update you with the result and the plan moving forward.   Abnormal Chest X-ray I have ordered a repeat chest x-ray. If there are no new findings, I suspect what they saw was your pneumonia. Otherwise, we will pursue further imaging.   Alcohol Use We will be treating you with a gabapentin taper.   You will take 300 mg every 6 hours for day 1 300 mg every 8 hours for day 2 300 mg every 12 hours for day 3 300 mg oral at night for day 4  Any further symptom please take one 300 mg pill.   !! Very important!! If you have other symptoms of alcohol withdrawal that cannot be controlled by the gabapentin  I have ordered the following labs for you:  Lab Orders  No laboratory test(s) ordered today     Referrals ordered today:   Referral Orders  Ambulatory referral to Gastroenterology    I have ordered the following medication/changed the following medications:   Stop the following medications: Medications Discontinued During This Encounter  Medication Reason   gabapentin (NEURONTIN) 300 MG capsule      Start the following medications: Meds ordered this encounter  Medications   gabapentin (NEURONTIN) 300 MG capsule    Sig: Take 1 capsule (300 mg total) by mouth every 6 (six) hours for 1 day, THEN 1 capsule (300 mg total) every 8 (eight) hours for 1 day, THEN 1 capsule (300 mg total) every 12 (twelve) hours for 1 day, THEN 1 capsule (300 mg total) at bedtime for 1 day. Take 1 pill as needed for symptoms.    Dispense:  90 capsule    Refill:  0     Follow up:  1 week     Should you have any questions or concerns please call the internal medicine clinic at [phone].     Sanjuana Letters, D.O. [place]

## 2021-01-17 NOTE — Assessment & Plan Note (Addendum)
Assessment: Patient states that recently he noticed that he had a painful mass on his right breast, lateral to his right nipple.  On deep palpation over this area patient does have noticeable difference of tissue that I suspect may be a mass.  It is tender to palpate.  No history of malignancy in his family, no history of breast cancer.  Patient does endorse night sweats for the past week and a half but denies any weight loss.  We will pursue right breast ultrasound to rule out malignancy.  Plan: -Right breast ultrasound pending  Addendum: [place] results without evidence of mass. Found to have bilateral gynecomastia, suspected to be 2/2 meds vs marijuana use. Will continue to monitor. Patient recommended to check breast tissue frequently and if any mass to reach out to [place].

## 2021-01-17 NOTE — Assessment & Plan Note (Signed)
Assessment: Patient with evidence of new airspace opacity of the right hilum since 03/22.  During the hospitalization there was some concern that patient had aspiration pneumonia and he was treated with antibiotics.  We will repeat the chest x-ray to determine if this area has resolved or not.  If no resolution of this area will perform chest CT for further evaluation of new opacity.  Patient denies weight loss.  For the past week and a half he does have night sweats where he will wake up and his sheets are soaked.  Denies any history of malignancy in his family history or in himself.  Plan: -Chest x-ray pending -If abnormal, will order chest CT

## 2021-01-18 ENCOUNTER — Other Ambulatory Visit (HOSPITAL_COMMUNITY): Payer: Self-pay

## 2021-01-18 DIAGNOSIS — Z0000 Encounter for general adult medical examination without abnormal findings: Secondary | ICD-10-CM | POA: Insufficient documentation

## 2021-01-18 NOTE — Assessment & Plan Note (Signed)
Patient due for colonoscopy.  Denies dark tarry stools or bright red blood per rectum.  Will place referral to GI

## 2021-01-20 ENCOUNTER — Telehealth: Payer: Self-pay | Admitting: Student

## 2021-01-20 NOTE — Telephone Encounter (Signed)
Attempted to call [name] for the past 2 days to discuss his alcohol tox and home taper with gabapentin.  However was unable to reach him.  Left multiple voice messages.  Was able to reach him today, patient states that he did drink alcohol the past 2 days.  Discussed with him that I would recommend him restarting his taper today, with the gabapentin every 6 hours.  Patient acknowledges understanding.  Also recommended that if he is close to running out of gabapentin that he call our office immediately to refill the medication.  Patient is in agreement with this plan.

## 2021-01-22 NOTE — Progress Notes (Signed)
Internal Medicine Clinic Attending  I saw and evaluated the patient.  I personally confirmed the key portions of the history and exam documented by Dr. [name] and I reviewed pertinent patient test results.  The assessment, diagnosis, and plan were formulated together and I agree with the documentation in the resident's note.  

## 2021-02-01 ENCOUNTER — Telehealth: Payer: Self-pay | Admitting: Student

## 2021-02-01 ENCOUNTER — Other Ambulatory Visit: Payer: Self-pay | Admitting: Student

## 2021-02-01 DIAGNOSIS — F1010 Alcohol abuse, uncomplicated: Secondary | ICD-10-CM

## 2021-02-01 NOTE — Telephone Encounter (Signed)
Patient has Medicaid. Are there any resources for this request?

## 2021-02-01 NOTE — Telephone Encounter (Signed)
Patient was called yesterday evening to discuss his alcohol use and how he was doing with the gabapentin taper. Patient stated he was not doing well at all and dealing with many home stressors that triggered him to revert back to alcohol use. He denied SI/HI during our conversations but did endorse having those thoughts earlier in the day. He requested I assist him with finding a detoxification/rehabilitation facility for his alcohol use.   I reached out to Dr. [name] earlier this morning about reaching out to the patient and seeing what she is able to assist him with. I will also reach out to social work and see if they have any good resources of facilities for the patient.

## 2021-02-01 NOTE — Telephone Encounter (Signed)
I am sorry-what is the request?

## 2021-02-01 NOTE — Telephone Encounter (Signed)
Pt requesting a call back about a Detox Facility he can go into.  Pt states he really needs to go into a Rehab as soon as possible and would like to where he can go.

## 2021-02-01 NOTE — Progress Notes (Signed)
Order placed to [facility] to assist patient with finding an alcohol detox program

## 2021-02-02 ENCOUNTER — Encounter: Payer: Self-pay | Admitting: Student

## 2021-02-02 ENCOUNTER — Ambulatory Visit: Payer: Medicaid Other | Admitting: Behavioral Health

## 2021-02-02 DIAGNOSIS — F419 Anxiety disorder, unspecified: Secondary | ICD-10-CM

## 2021-02-02 DIAGNOSIS — F10939 Alcohol use, unspecified with withdrawal, unspecified: Secondary | ICD-10-CM

## 2021-02-02 DIAGNOSIS — F10239 Alcohol dependence with withdrawal, unspecified: Secondary | ICD-10-CM

## 2021-02-02 DIAGNOSIS — F332 Major depressive disorder, recurrent severe without psychotic features: Secondary | ICD-10-CM

## 2021-02-02 DIAGNOSIS — F4310 Post-traumatic stress disorder, unspecified: Secondary | ICD-10-CM

## 2021-02-02 NOTE — BH Specialist Note (Signed)
Integrated Behavioral Health via Telemedicine Visit  02/02/2021 [name] [id number]  Number of [place] visits: 9 Session Start time: 10:00am  Session End time: 10:40am Total time: 40   Referring Provider: Dr. Morrison Old, MD Patient/Family location: Pt is home in private [facility] Provider location: [facility] All persons participating in visit: Pt & Clinician Types of Service: Health Promotion and securing of InPt Detox per Pt request a/o Tue, 02/01/21  I connected with [name] and/or [name]  self  via  Telephone or [employer]  (Video is Caregility application) and verified that I am speaking with the correct person using two identifiers. Discussed confidentiality:  9th visit  I discussed the limitations of telemedicine and the availability of in person appointments.  Discussed there is a possibility of technology failure and discussed alternative modes of communication if that failure occurs.  I discussed that engaging in this telemedicine visit, they consent to the provision of behavioral healthcare and the services will be billed under their insurance.  Patient and/or legal guardian expressed understanding and consented to Telemedicine visit:  9th visit  Presenting Concerns: Patient and/or family reports the following symptoms/concerns: elevated anx/dep with himself over chronic use of ETOH; Pt in 'Preparation' sage of MI. Pt is planning for extended InPt stay once Facility is located Duration of problem: yrs; Severity of problem: severe; Pt threatened SA w/gun to his mouth, but did not fllw through. Pt gave weapon to a friend. Sig Other Hassan Rowan) is in full support of Pt's decision to seek Tx.  Patient and/or Family's Strengths/Protective Factors: Social connections, Social and Emotional competence, Concrete supports in place (healthy food, safe environments, etc.), and Sense of purpose; Pt expressing motivation to do this  for himself & realizes he needs InPt care, "away from the outside world".  Goals Addressed: Patient will:  Reduce symptoms of: agitation, anxiety, depression, mood instability, and stress   Increase knowledge and/or ability of: coping skills, healthy habits, stress reduction, and Recovery Support/Relapse Prevention to prevent return to use    Demonstrate ability to: Increase healthy adjustment to current life circumstances, Increase adequate support systems for patient/family, Increase motivation to adhere to plan of care, and Decrease self-medicating behaviors  Progress towards Goals: Ongoing  Interventions: Interventions utilized:  Solution-Focused Strategies, Behavioral Activation, and Supportive Counseling Standardized Assessments completed: AUDIT-C: 11  Patient and/or Family Response: Pt receptive to call & consistently a gentleman on telehealth. Pt has motivation to act on his current need for Tx & being explicit about those needs w/all Providers.  Assessment: Patient currently experiencing heightened anx/dep & triggering w/current beh that is worrisome to him. 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  Patient may benefit from Tx for his ETOH consumption & probable PTSD using TIC.  Plan: Follow up with behavioral health clinician on : TBD upon Pt completion of Detox & cont'g Tx protocol. Will fllw Pt per Pt consent Behavioral recommendations: Maintain your current stance on your needs & fllw through as directed by Beatriz Stallion, FNP @ [facility]. Referral(s):  [facility]  I discussed the assessment and treatment plan with the patient and/or parent/guardian. They were provided an opportunity to ask questions and all were answered. They agreed with the plan and demonstrated an understanding of the instructions.   They were advised to call back or seek an in-person evaluation if the symptoms worsen or if the condition fails  to improve as anticipated.  Jannell Franta L Vega Stare, LMFT 

## 2021-02-02 NOTE — Addendum Note (Signed)
Addended by: [name] on: 02/02/2021 03:53 PM   Modules accepted: Orders

## 2021-02-03 ENCOUNTER — Other Ambulatory Visit: Payer: Self-pay

## 2021-02-03 ENCOUNTER — Ambulatory Visit (HOSPITAL_COMMUNITY)
Admission: EM | Admit: 2021-02-03 | Discharge: 2021-02-03 | Disposition: A | Discharge status: Home / Self Care | Payer: Medicaid Other | Attending: Licensed Clinical Social Worker | Admitting: Licensed Clinical Social Worker

## 2021-02-03 DIAGNOSIS — F17210 Nicotine dependence, cigarettes, uncomplicated: Secondary | ICD-10-CM | POA: Insufficient documentation

## 2021-02-03 DIAGNOSIS — Z79899 Other long term (current) drug therapy: Secondary | ICD-10-CM | POA: Insufficient documentation

## 2021-02-03 DIAGNOSIS — F32A Depression, unspecified: Secondary | ICD-10-CM | POA: Insufficient documentation

## 2021-02-03 DIAGNOSIS — F1020 Alcohol dependence, uncomplicated: Secondary | ICD-10-CM

## 2021-02-03 DIAGNOSIS — F1029 Alcohol dependence with unspecified alcohol-induced disorder: Secondary | ICD-10-CM | POA: Diagnosis not present

## 2021-02-03 DIAGNOSIS — F1010 Alcohol abuse, uncomplicated: Secondary | ICD-10-CM

## 2021-02-03 DIAGNOSIS — F10288 Alcohol dependence with other alcohol-induced disorder: Secondary | ICD-10-CM

## 2021-02-03 NOTE — BH Assessment (Signed)
Comprehensive Clinical Assessment (CCA) Note  02/03/2021 [name] [id number]  Chief Complaint: No chief complaint on file.  Visit Diagnosis:  Alcohol abuse, uncomplicated  Disposition:  Per Trinna Post, PA pt does not meet inpatient criteria and there is no bed availability at this time for facility based crisis. Pt was given outpatient SA resources and instructed to come back or call [facility] tomorrow AM to see if bed available. Pt was provided contact info for [facility].   Learned ED from 02/03/2021 in [facility] ED to Hosp-Admission (Discharged) from 11/08/2020 in [facility] ED to Hosp-Admission (Discharged) from 09/26/2020 in [facility]  C-SSRS RISK CATEGORY No Risk No Risk No Risk       The patient demonstrates the following risk factors for suicide: Chronic risk factors for suicide include: substance use disorder. Acute risk factors for suicide include: social withdrawal/isolation. Protective factors for this patient include: positive social support. Considering these factors, the overall suicide risk at this point appears to be low. Patient is appropriate for outpatient follow up.   [name] Is a 55 year old male presenting as a walk in to [facility] accompanied by his fiance. Patient reports that he had an appointment with urgent care today (Dr. [name]), and was recommended to come to [facility] for evaluation of alcohol abuse. Patient currently denying any suicidal ideations, homicidal ideations, visual hallucinations, or auditory hallucinations. Patient reports that he is here to get help for his drinking. Patient and fiance do not feel the patient is currently a danger to himself or others, but fear that he will harm himself if he continues drinking heavily. Patient denies any past history of violence or seizures. Patient does report that he is currently incontinent--urinary and bowels  CCA Screening, Triage and  Referral (STR)  Patient Reported Information How did you hear about [place]? Self  What Is the Reason for Your Visit/Call Today? [name] Is a 56 year old male presenting as a walk in to [facility] accompanied by his fiance. Patient reports that he had an appointment with urgent care today (Dr. [name]), and was recommended to come to [facility] for evaluation of alcohol abuse. Patient currently denying any suicidal ideations, homicidal ideations, visual hallucinations, or auditory hallucinations. Patient reports that he is here to get help for his drinking. Patient and fiance do not feel the patient is currently a danger to himself or others, but fear that he will harm himself if he continues drinking heavily. Patient denies any past history of violence or seizures. Patient does report that he is currently incontinent--urinary and bowels.  How Long Has This Been Causing You Problems? > than 6 months  What Do You Feel Would Help You the Most Today? Alcohol or Drug Use Treatment   Have You Recently Had Any Thoughts About Hurting Yourself? No  Are You Planning to Commit Suicide/Harm Yourself At This time? No   Have you Recently Had Thoughts About [place]? No  Are You Planning to Harm Someone at This Time? No  Explanation: No data recorded  Have You Used Any Alcohol or Drugs in the Past 24 Hours? Yes  How Long Ago Did You Use Drugs or Alcohol? No data recorded What Did You Use and How Much? Per patient:  "I drink about 6 small beers per day".  Per fiance: "he drinks until he can't drink any more"   Do You Currently Have a Therapist/Psychiatrist? Yes  Name of Therapist/Psychiatrist:  Kerin Salen, LMFT   Have You Been Recently Discharged From Any Office Practice or Programs? No  Explanation of Discharge From Practice/Program: No data recorded    CCA Screening Triage Referral Assessment Type of Contact: Face-to-Face  Telemedicine Service Delivery:   Is this Initial or  Reassessment? No data recorded Date Telepsych consult ordered in [facility]:  No data recorded Time Telepsych consult ordered in [facility]:  No data recorded Location of Assessment: [facility] Assessment Services  Provider Location: [facility] Assessment Services   Collateral Involvement: Pts fiance, Hubbard Hartshorn was present throughout assessment.   Does Patient Have a Stage manager Guardian? No data recorded Name and Contact of Legal Guardian: No data recorded If Minor and Not Living with Parent(s), Who has Custody? No data recorded Is CPS involved or ever been involved? Never  Is APS involved or ever been involved? Never   Patient Determined To Be At Risk for Harm To Self or Others Based on Review of Patient Reported Information or Presenting Complaint? No  Method: No data recorded Availability of Means: No data recorded Intent: No data recorded Notification Required: No data recorded Additional Information for Danger to Others Potential: No data recorded Additional Comments for Danger to Others Potential: No data recorded Are There Guns or Other Weapons in Your Home? No data recorded Types of Guns/Weapons: No data recorded Are These Weapons Safely Secured?                            No data recorded Who Could Verify You Are Able To Have These Secured: No data recorded Do You Have any Outstanding Charges, Pending Court Dates, Parole/Probation? No data recorded Contacted To Inform of Risk of Harm To Self or Others: No data recorded   Does Patient Present under Involuntary Commitment? No  IVC Papers Initial File Date: No data recorded  [place] of Residence: [place]   Patient Currently Receiving the Following Services: Medication Management   Determination of Need: Urgent (48 hours)   Options For Referral: Outpatient Therapy; Medication Management; Chemical Dependency Intensive Outpatient Therapy (CDIOP); Facility-Based Crisis     CCA Biopsychosocial Patient Reported  Schizophrenia/Schizoaffective Diagnosis in Past: No   Strengths: No data recorded  Mental Health Symptoms Depression:   Change in energy/activity; Difficulty Concentrating; Hopelessness; Irritability; Sleep (too much or little); Weight gain/loss   Duration of Depressive symptoms:  Duration of Depressive Symptoms: Greater than two weeks   Mania:   Racing thoughts   Anxiety:    Worrying; Tension; Restlessness   Psychosis:   None   Duration of Psychotic symptoms:    Trauma:   None   Obsessions:   None   Compulsions:   None   Inattention:   None   Hyperactivity/Impulsivity:   None   Oppositional/Defiant Behaviors:   Angry; Temper (anger when drinking)   Emotional Irregularity:   Mood lability   Other Mood/Personality Symptoms:  No data recorded   Mental Status Exam Appearance and self-care  Stature:   Average   Weight:   Average weight   Clothing:   Neat/clean   Grooming:   Normal   Cosmetic use:   None   Posture/gait:   Normal   Motor activity:   Agitated   Sensorium  Attention:   Normal   Concentration:   Normal   Orientation:   X5   Recall/memory:   Normal   Affect and Mood  Affect:   Anxious  Mood:   Anxious; Irritable   Relating  Eye contact:   Normal; Staring   Facial expression:   Anxious   Attitude toward examiner:   Cooperative   Thought and Language  Speech flow:  Clear and Coherent   Thought content:   Appropriate to Mood and Circumstances   Preoccupation:   None   Hallucinations:   None   Organization:  No data recorded  [employer] of Knowledge:  No data recorded  Intelligence:   Average   Abstraction:   Normal   Judgement:   Good   Reality Testing:   Realistic   Insight:   Gaps   Decision Making:   Impulsive   Social Functioning  Social Maturity:   Isolates   Social Judgement:   Normal   Stress  Stressors:   Illness (worried about health if pt continues  to drink etoh)   Coping Ability:  No data recorded  Skill Deficits:  No data recorded  Supports:   Church     Religion: Religion/Spirituality Are You A Religious Person?: Yes  Leisure/Recreation: Leisure / Recreation Do You Have Hobbies?: Yes Leisure and Hobbies: fishing; spending time with fiance  Exercise/Diet: Exercise/Diet Do You Exercise?: No Have You Gained or Lost A Significant Amount of Weight in the Past Six Months?: Yes-Lost (gained 6 back) Number of Pounds Lost?: 30 Do You Follow a Special Diet?: No Do You Have Any Trouble Sleeping?: No   CCA Employment/Education Employment/Work Situation: Employment / Work Situation Employment Situation: On disability Patient's Job has Been Impacted by Current Illness: No Has Patient ever Been in the [employer]?: No  Education: Education Is Patient Currently Attending School?: No Did Physicist, medical?: No Did You Have An Individualized Education Program (IIEP): No Did You Have Any Difficulty At [employer]?: No Patient's Education Has Been Impacted by Current Illness: No   CCA Family/Childhood History Family and Relationship History: Family history Marital status: Long term relationship Does patient have children?: No  Childhood History:  Childhood History By whom was/is the patient raised?:  (information not given at time of assessment) Did patient suffer any verbal/emotional/physical/sexual abuse as a child?:  (information not given at time of assessment) Did patient suffer from severe childhood neglect?:  (information not given at time of assessment) Has patient ever been sexually abused/assaulted/raped as an adolescent or adult?:  (information not given at time of assessment) Was the patient ever a victim of a crime or a disaster?:  (information not given at time of assessment) Witnessed domestic violence?:  (information not given at time of assessment) Has patient been affected by domestic violence as an adult?:   (information not given at time of assessment)  Child/Adolescent Assessment:     CCA Substance Use Alcohol/Drug Use: Alcohol / Drug Use Pain Medications: see MAR Prescriptions: see MAR Over the Counter: see MAR History of alcohol / drug use?: Yes Substance #1 Name of Substance 1: ETOH 1 - Age of First Use: 12 1 - Amount (size/oz): variable 1 - Frequency: daily 1 - Duration: years 1 - Last Use / Amount: few hours ago 1 - Method of Aquiring: legal/store 1- Route of Use: oral/drink Substance #2 Name of Substance 2: THC 2 - Amount (size/oz): variable 2 - Frequency: daily 2 - Duration: years 2 - Last Use / Amount: yesterday 2 - Method of Aquiring: street 2 - Route of Substance Use: oral/smoke     ASAM's:  Six Dimensions of Multidimensional Assessment  Dimension 1:  Acute Intoxication and/or Withdrawal Potential:   Dimension 1:  Description of individual's past and current experiences of substance use and withdrawal: pt currently using alcohol and THC daily  Dimension 2:  Biomedical Conditions and Complications:   Dimension 2:  Description of patient's biomedical conditions and  complications: liver damage; bowel/bladder incontinence  Dimension 3:  Emotional, Behavioral, or Cognitive Conditions and Complications:  Dimension 3:  Description of emotional, behavioral, or cognitive conditions and complications: history of depression/anxiety. No present SI/HI/AVH  Dimension 4:  Readiness to Change:  Dimension 4:  Description of Readiness to Change criteria: pt is willing to make changes  Dimension 5:  Relapse, Continued use, or Continued Problem Potential:  Dimension 5:  Relapse, continued use, or continued problem potential critiera description: pt has history of relapse after hospital discharge  Dimension 6:  Recovery/Living Environment:  Dimension 6:  Recovery/Iiving environment criteria description: pt hsa supportive partner  ASAM Severity Score: ASAM's Severity Rating Score: 4   ASAM Recommended Level of Treatment:     Substance use Disorder (SUD) Substance Use Disorder (SUD)  Checklist Symptoms of Substance Use: Continued use despite having a persistent/recurrent physical/psychological problem caused/exacerbated by use, Continued use despite persistent or recurrent social, interpersonal problems, caused or exacerbated by use, Evidence of tolerance, Evidence of withdrawal (Comment), Large amounts of time spent to obtain, use or recover from the substance(s), Persistent desire or unsuccessful efforts to cut down or control use, Presence of craving or strong urge to use, Recurrent use that results in a failure to fulfill major role obligations (work, school, home), Social, occupational, recreational activities given up or reduced due to use  Recommendations for Services/Supports/Treatments: Recommendations for Services/Supports/Treatments Recommendations For Services/Supports/Treatments: Detox, SAIOP (Substance Abuse Intensive Outpatient Program), Medication Management, Other (Comment) (facility based crisis)  Discharge Disposition:  Discharge with SA resources--pt encouraged to follow up with [facility] tomorrow to check on bed availability.   DSM5 Diagnoses: Patient Active Problem List   Diagnosis Date Noted   Healthcare maintenance 01/18/2021   Abnormal chest x-ray 01/17/2021   Breast mass, right 01/17/2021   Heroin use disorder, mild, in early remission, on maintenance therapy, abuse ([place]) 01/17/2021   Alcohol withdrawal syndrome ([place]) 11/08/2020   Low Volume Hemoptysis 11/08/2020   Aspiration pneumonia ([place]) 11/08/2020   Dehydration 11/08/2020   Hyperbilirubinemia 11/08/2020   Tachycardia 10/06/2020   Generalized weakness 10/06/2020   Syncope 09/27/2020   Vitamin D deficiency 07/06/2020   Right hip pain 07/02/2020   Alcohol use disorder, mild, abuse 02/13/2020   Elevated serum GGT level 11/06/2019   Paresthesia 05/15/2019   Pain in both lower extremities  05/15/2019   Functional incontinence 04/14/2019   Gait abnormality 04/14/2019   Myositis 02/06/2019   Anemia 03/14/2018   Left leg pain 01/21/2017   Essential hypertension 01/21/2017   MDD (major depressive disorder) 03/25/2010   ERECTILE DYSFUNCTION 05/14/2008   GERD 04/13/2008   TOBACCO ABUSE 03/11/2008     Referrals to Alternative Service(s): Referred to Alternative Service(s):   Place:   Date:   Time:    Referred to Alternative Service(s):   Place:   Date:   Time:    Referred to Alternative Service(s):   Place:   Date:   Time:    Referred to Alternative Service(s):   Place:   Date:   Time:     Rachel Bo Niyah Mamaril, LCSW

## 2021-02-03 NOTE — ED Provider Notes (Signed)
Behavioral Health Urgent Care Medical Screening Exam  Patient Name: [name] MRN: [id number] Date of Evaluation: 02/03/21 Chief Complaint:  "Getting over alcohol addiction." Diagnosis:  Final diagnoses:  Alcohol dependence with other alcohol-induced disorder ([place])    History of Present illness: [name] is a 55 y.o. male with a past psychiatric history significant for depression and alcohol abuse who presents to [facility], accompanied by his fiance Hubbard Hartshorn), for detox.  Patient reports that he wants to stop drinking.  Patient states that his alcohol abuse has been an ongoing issue for roughly 45 years.  On average, patient states that he drinks roughly 7-8 beers a day.  He states that he occasionally drinks wine but has not resorted to drinking liquor.  Patient's fianc specified that the patient drinks malt liquor beer.  Patient states that he has only had two 12 ounce beers today and is currently not intoxicated.  Patient endorses a history of depression but denies experiencing any depressive symptoms at this time. Patient has a history of using Depakote and is currently taking Cymbalta and gabapentin.   He does express that he is a little disappointed in the amenities provided by [facility].  Patient is unable to determine any discernible triggers to his alcohol abuse and reports that he mostly drinks out of habit.  Per fianc, patient was recently hospitalized twice and during his hospitalizations, patient went through severe and debilitating withdrawals.  During his withdrawals, patient's fiance stated that the patient was unable to walk, talk, or feed himself.  She states that due to his alcohol abuse, patient suffers from both urinary and bowel incontinence.  She expresses that it was the patient's idea to come in today but she is fearful that the patient will undergo severe withdrawal symptoms if he is unable to receive detox today.  It is noted  that the patient does not want to go to the hospital. Patient expresses that he will not drink if her were to be discharged today.  Patient is alert and oriented x4, calm, cooperative, and fully engaged in conversation during the encounter.  Patient is irritable at times during the encounter.  Patient's source of irritation is mostly attributed to the response from his fiance disclosing intimate details regarding his alcohol abuse.  Patient denies suicidal or homicidal ideations.  He further denies auditory or visual hallucinations.  Patient endorses good sleep and receives on average 6 to 8 hours of sleep each night.  Patient endorses poor appetite and eats at least 1 meal (dinner) per day.  Patient has a long history of alcohol abuse that has severely impacted his liver.  Patient endorses tobacco use and smokes on average a pack per day.  Patient also endorses illicit drug use in the form of marijuana.  Patient currently does not feel like a danger to himself and is able to contract for safety.  Patient expresses that if he is unable to receive detox services today, he is open to returning tomorrow.  Per chart review, patient was recently hospitalized due to healthcare associated pneumonia (aspiration pneumonia) back in May 2022.  Psychiatric Specialty Exam  Presentation  General Appearance:Appropriate for Environment; Fairly Groomed  Eye Contact:Good  Speech:Clear and Coherent; Normal Rate  Speech Volume:Normal  Handedness:Right   Mood and Affect  Mood:Irritable; Euthymic  Affect:Congruent; Appropriate   Thought Process  Thought Processes:Coherent; Goal Directed  Descriptions of Associations:Intact  Orientation:Full (Time, Place and Person)  Thought Content:Logical; WDL  Diagnosis of  Schizophrenia or Schizoaffective disorder in past: No   Hallucinations:None  Ideas of Reference:None  Suicidal Thoughts:No  Homicidal Thoughts:No   Sensorium  Memory:Immediate Good;  Recent Good; Recent Fair; Remote Fair  Judgment:Good  Insight:Fair   Executive Functions  Concentration:Good  Attention Span:Good  [place] of Knowledge:Good  Language:Good   Psychomotor Activity  Psychomotor Activity:Normal   Assets  Assets:Communication Skills; Desire for Improvement; Social Support; Intimacy; Housing   Sleep  Sleep:Good  Number of hours:  No data recorded  No data recorded  Physical Exam: Physical Exam Psychiatric:        Attention and Perception: Attention and perception normal. He does not perceive auditory or visual hallucinations.        Mood and Affect: Mood and affect normal.        Speech: Speech normal.        Behavior: Behavior is agitated. Behavior is cooperative.        Thought Content: Thought content normal. Thought content is not paranoid. Thought content does not include homicidal or suicidal ideation. Thought content does not include suicidal plan.        Cognition and Memory: Cognition and memory normal.        Judgment: Judgment normal.   Review of Systems  Psychiatric/Behavioral:  Positive for substance abuse. Negative for depression, hallucinations and suicidal ideas. The patient is not nervous/anxious and does not have insomnia.   Blood pressure 130/86, pulse 88, temperature 98.7 F (37.1 C), temperature source Oral, resp. rate 16, SpO2 97 %. There is no height or weight on file to calculate BMI.  Musculoskeletal: Strength & Muscle Tone: within normal limits Gait & Station: normal Patient leans: N/A   [place] MSE Discharge Disposition for Follow up and Recommendations: Based on my evaluation the patient does not appear to have an emergency medical condition and can be discharged with resources and follow up care in outpatient services for Substance Abuse Intensive Outpatient Program  Patient denies suicidal and homicidal ideations as well as auditory or visual hallucinations. Patient was provided resources for  detox facilities as well as contact information.   Malachy Mood, PA 02/03/2021, 8:27 PM

## 2021-02-03 NOTE — Discharge Instructions (Signed)
Patient to be provided resources for alcohol detox. Patient will also be given contact information for [facility].

## 2021-02-03 NOTE — ED Notes (Signed)
Pt has been provided resources and AVS paperwork. Him and significant other has left

## 2021-02-07 ENCOUNTER — Other Ambulatory Visit: Payer: Self-pay | Admitting: Internal Medicine

## 2021-02-07 DIAGNOSIS — N6310 Unspecified lump in the right breast, unspecified quadrant: Secondary | ICD-10-CM

## 2021-02-09 ENCOUNTER — Other Ambulatory Visit: Payer: Self-pay

## 2021-02-09 ENCOUNTER — Other Ambulatory Visit: Payer: Medicaid Other

## 2021-02-09 ENCOUNTER — Telehealth: Payer: Self-pay | Admitting: Behavioral Health

## 2021-02-09 NOTE — Patient Outreach (Signed)
Medicaid Managed Care Social Work Note  02/09/2021 Name:  [name] MRN:  [id number] DOB:  11/24/1965  [name] is an 55 y.o. year old male who is a primary patient of [name], MD.  The Medicaid Managed Care Coordination team was consulted for assistance with:   referral for inpatient detox programs  [name] was given information about Medicaid Managed Care Coordination team services today. [name]  patient is not a minor  Engaged with patient  for by telephone forinitial visit in response to referral for case management and/or care coordination services.   Assessments/Interventions:  Review of past medical history, allergies, medications, health status, including review of consultants reports, laboratory and other test data, was performed as part of comprehensive evaluation and provision of chronic care management services.  SDOH: (Social Determinant of Health) assessments and interventions performed: BSW received a referral for patient wanting to go to an inpatient detox facility. Patient stated that he was dissatisfied with his experience at [facility]. Patient stated "they did not help me, they just wanted to run my insurance" Patient stated he no longer wanted to go into an inpatient facility. Patient states he has been 1 week without alcohol and he feels as though he can do it himself with the support of his fiance. Patient did agree to MM services and wants to be followed by [facility] and pharmacist. No other services/resources are needed at this time. BSW will continue to follow up with patient.   Advanced Directives Status:  Not addressed in this encounter.  Care Plan                 No Known Allergies  Medications Reviewed Today     Reviewed by [name], [name], CPhT (Pharmacy Technician) on 11/08/20 at 1129  Med List Status: Complete   Medication Order Taking? Sig Documenting Provider Last Dose Status Informant  atorvastatin (LIPITOR) 40 MG tablet  [id number] Yes Take 1 tablet (40 mg total) by mouth daily. [name], MD Past Week Unknown time Active Self  cyclobenzaprine (FLEXERIL) 5 MG tablet [id number] Yes Take 1 tablet (5 mg total) by mouth 3 (three) times daily as needed for muscle spasms. [name], MD Past Week Unknown time Active Self  DULoxetine (CYMBALTA) 60 MG capsule [id number] Yes Take 1 capsule (60 mg total) by mouth daily. [name], MD Past Week Unknown time Active Self  ergocalciferol (VITAMIN D2) 1.25 MG (50000 UT) capsule [id number] Yes Take 1 capsule (50,000 Units total) by mouth once a week.  Patient taking differently: Take 50,000 Units by mouth once a week. Mondays   [name], MD 11/01/2020 Active Self  gabapentin (NEURONTIN) 300 MG capsule [id number] Yes Take 2 capsules (600 mg total) by mouth 3 (three) times daily. [name], MD Past Week Unknown time Active Self  Multiple Vitamin (MULTIVITAMIN ADULT PO) [id number] Yes Take 2 tablets by mouth daily. [provider] Past Week Unknown time Active Self  tetrahydrozoline-zinc (VISINE-AC) 0.05-0.25 % ophthalmic solution [id number] Yes Place 1 drop into both eyes 3 (three) times daily as needed (dry eyes). [provider] unknown Active Self  thiamine 100 MG tablet [id number] Yes Take 1 tablet (100 mg total) by mouth daily. [name], MD Past Week Unknown time Active Self            Patient Active Problem List   Diagnosis Date Noted   Healthcare maintenance 01/18/2021   Abnormal chest x-ray 01/17/2021   Breast  mass, right 01/17/2021   Heroin use disorder, mild, in early remission, on maintenance therapy, abuse ([place]) 01/17/2021   Alcohol withdrawal syndrome ([place]) 11/08/2020   Low Volume Hemoptysis 11/08/2020   Aspiration pneumonia ([place]) 11/08/2020   Dehydration 11/08/2020   Hyperbilirubinemia 11/08/2020   Tachycardia 10/06/2020   Generalized weakness 10/06/2020   Syncope 09/27/2020   Vitamin D deficiency 07/06/2020   Right hip  pain 07/02/2020   Alcohol use disorder, mild, abuse 02/13/2020   Elevated serum GGT level 11/06/2019   Paresthesia 05/15/2019   Pain in both lower extremities 05/15/2019   Functional incontinence 04/14/2019   Gait abnormality 04/14/2019   Myositis 02/06/2019   Anemia 03/14/2018   Left leg pain 01/21/2017   Essential hypertension 01/21/2017   MDD (major depressive disorder) 03/25/2010   ERECTILE DYSFUNCTION 05/14/2008   GERD 04/13/2008   TOBACCO ABUSE 03/11/2008    Conditions to be addressed/monitored per PCP order:  Depression and alcohol abuse  There are no care plans that you recently modified to display for this patient.   Follow up:  Patient agrees to Care Plan and Follow-up.  Plan: The Managed Medicaid care management team will reach out to the patient again over the next 30 days.  Date/time of next scheduled Social Work care management/care coordination outreach:  03/11/21  [name], Arita Miss, [place] Medicaid Team  [phone]

## 2021-02-09 NOTE — Patient Instructions (Signed)
Visit Information  [name] was given information about Medicaid Managed Care team care coordination services as a part of their [place] Medicaid benefit. [name] verbally consented to engagement with the [facility] Managed Care team.   If you are experiencing a medical emergency, please call 911 or report to your local emergency department or urgent care.   If you have a non-emergency medical problem during routine business hours, please contact your provider's office and ask to speak with a nurse.   For questions related to your [facility], please call: [phone] or visit the homepage here: [email or web address]  If you would like to schedule transportation through your [facility], please call the following number at least 2 days in advance of your appointment: [phone].   Call the [place] at [phone], at any time, 24 hours a day, 7 days a week. If you are in danger or need immediate medical attention call 911.  If you would like help to quit smoking, call 1-800-QUIT-NOW (418)658-8471) OR Espaol: 1-855-Djelo-Ya HD:1601594) o para ms informacin haga clic aqu or Text READY to 200-400 to register via text  [name] - following are the goals we discussed in your visit today:   Goals Addressed   None       Social Worker will follow up with patient in 30 days.   [name], BSW, [place] Managed Medicaid Team  [phone]   Following is a copy of your plan of care:  There are no care plans to display for this patient.

## 2021-02-09 NOTE — Telephone Encounter (Signed)
Contacted Pt to f/u from last weeks' events. Pt has good news to share, but is being transported by his Son to see his Uncle in the hosp. Agreed we could speak on Thur-  Dr. [name]

## 2021-02-11 ENCOUNTER — Ambulatory Visit: Payer: Medicaid Other

## 2021-02-14 ENCOUNTER — Other Ambulatory Visit: Payer: Self-pay | Admitting: *Deleted

## 2021-02-14 NOTE — Patient Outreach (Signed)
Care Coordination  02/14/2021  [name] 11/12/1965 [id number]  Successful telephone outreach to patient for scheduled initial assessment however unable to complete assessment as patient received call from family member and requests call be reschedule to 02/15/21 at 12 noon.  Plan:  Initial telephone assessment rescheduled for 02/14/21 at 12 noon per patient request.  Patient was also provided with direct contact number for this CCM RN.   Kelli Churn RN, CCM, [place] [facility] Care Management Coordinator - Managed [place] High Risk [phone]

## 2021-02-15 ENCOUNTER — Other Ambulatory Visit: Payer: Self-pay | Admitting: *Deleted

## 2021-02-15 ENCOUNTER — Ambulatory Visit: Payer: Self-pay

## 2021-02-15 ENCOUNTER — Telehealth: Payer: Self-pay | Admitting: Student

## 2021-02-15 NOTE — Patient Outreach (Addendum)
Care Coordination  02/15/2021  [name] [birth year] [id number]  02/15/2021 Name: [name] MRN: [id number] DOB: 07-26-1965  Referred by: [name], MD Reason for referral : High Risk Managed Medicaid (2nd RNCM telephone outreach to complete initial assessment)   An unsuccessful telephone outreach was attempted today. The patient was referred to the case management team for assistance with care management and care coordination.    Follow Up Plan: A HIPAA compliant phone message was left for the patient providing contact information and requesting a return call. and The Managed Medicaid care management team will reach out to the patient again over the next 7 days.    Kelli Churn RN, CCM, [place] [facility] Care Management Coordinator - Managed [place] High Risk [phone]

## 2021-02-15 NOTE — Telephone Encounter (Signed)
..   Medicaid Managed Care   Unsuccessful Outreach Note  02/15/2021 Name: [name] MRN: [id number] DOB: Jun 19, 1966  Referred by: [name], MD Reason for referral : High Risk Managed Medicaid (I called Mr.Lengyel today to see if we could reschedule his phone visit with the MM RNCM. I left my name and number on his VM.)   An unsuccessful telephone outreach was attempted today. The patient was referred to the case management team for assistance with care management and care coordination.   Follow Up Plan: The care management team will reach out to the patient again over the next 7 days.   [place]

## 2021-02-15 NOTE — Patient Instructions (Signed)
[name] ,   The [facility] Managed Care Team is available to provide assistance to you with your healthcare needs at no cost and as a benefit of your [facility] Health plan. I'm sorry I was unable to reach you today for our scheduled appointment. Our care guide will call you to reschedule our telephone appointment. Please call me at the number below. I am available to be of assistance to you regarding your healthcare needs. .   Thank you,   Kelli Churn RN, CCM, [place] [facility] Care Management Coordinator - Managed [place] High Risk [phone]

## 2021-02-15 NOTE — Patient Outreach (Signed)
Care Coordination  02/15/2021  Finnegan Carkhuff 08/09/1965 [id number]  Patient left message for this CCM RN stating he was unaware his phone was turned off and therefore did not receive the call for the scheduled 12 noon initial telephone assessment.He is requesting a return call.  Plan:  Notified scheduling care guide Reita Chard requesting she contact patient to reschedule initial telephone assessment, preferably for the morning of 02/18/21.  Kelli Churn RN, CCM, [place] [facility] Care Management Coordinator - Managed [place] High Risk [phone]

## 2021-02-18 ENCOUNTER — Other Ambulatory Visit: Payer: Self-pay

## 2021-02-18 NOTE — Patient Instructions (Signed)
Visit Information  [name] was given information about Medicaid Managed Care team care coordination services as a part of their [place] Medicaid benefit. [name] verbally consented to engagement with the [facility] Managed Care team.   If you are experiencing a medical emergency, please call 911 or report to your local emergency department or urgent care.   If you have a non-emergency medical problem during routine business hours, please contact your provider's office and ask to speak with a nurse.   For questions related to your [place] [facility], please call: [phone] or visit the homepage here: [email or web address]  If you would like to schedule transportation through your [facility], please call the following number at least 2 days in advance of your appointment: [phone].   Call the [place] at [phone], at any time, 24 hours a day, 7 days a week. If you are in danger or need immediate medical attention call 911.  If you would like help to quit smoking, call 1-800-QUIT-NOW 787-663-5854) OR Espaol: 1-855-Djelo-Ya QO:409462) o para ms informacin haga clic aqu or Text READY to 200-400 to register via text  [name] - following are the goals we discussed in your visit today:   Goals Addressed   None     Please see education materials related to Cholesterol provided as print materials.   The patient verbalized understanding of instructions provided today and declined a print copy of patient instruction materials.   The Managed Medicaid care management team will reach out to the patient again over the next 90 days.   [place] Constable, Pharm.D., Managed Medicaid Pharmacist [phone]   Following is a copy of your plan of care:  Patient Care Plan: Medication Management     Problem Identified: Health Promotion  or Disease Self-Management (General Plan of Care)      Goal: Medication Management   Note:   Current Barriers:  Unable to independently afford treatment regimen Does not adhere to prescribed medication regimen Does not maintain contact with provider office Does not contact provider office for questions/concerns   Pharmacist Clinical Goal(s):  Over the next 90 days, patient will contact provider office for questions/concerns as evidenced notation of same in electronic health record through collaboration with PharmD and provider.    Interventions: Inter-disciplinary care team collaboration (see longitudinal plan of care) Comprehensive medication review performed; medication list updated in electronic medical record     Patient Goals/Self-Care Activities Over the next 90 days, patient will:  - collaborate with provider on medication access solutions  Follow Up Plan: The patient has been provided with contact information for the care management team and has been advised to call with any health related questions or concerns.      Task: Mutually Develop and Royce Macadamia Achievement of Patient Goals   Note:   Care Management Activities:    - verbalization of feelings encouraged    Notes:

## 2021-02-18 NOTE — Patient Outreach (Addendum)
Medicaid Managed Care Pharmacy Note  02/18/2021 Name:  Ishraq Wanless MRN:  [id number] DOB:  [birth year]  Shyam Heglund is an 55 y.o. year old male who is a primary patient of [name], MD.  The [facility] Managed Care Coordination team was consulted for assistance with disease management and care coordination needs.    Engaged with patient by telephone for initial visit in response to referral for case management and/or care coordination services.  Mr. Orsak was given information about Medicaid Managed Care Coordination team services today. [name] Patient agreed to services and verbal consent obtained.  Objective:  Lab Results  Component Value Date   CREATININE 0.54 (L) 11/17/2020   CREATININE 0.52 (L) 11/13/2020   CREATININE 0.62 11/12/2020    Lab Results  Component Value Date   HGBA1C 5.3 09/27/2020       Component Value Date/Time   CHOL 187 09/27/2020 0337   CHOL 155 01/19/2017 1600   TRIG 131 09/27/2020 0337   HDL 36 (L) 09/27/2020 0337   HDL 62 01/19/2017 1600   CHOLHDL 5.2 09/27/2020 0337   VLDL 26 09/27/2020 0337   LDLCALC 125 (H) 09/27/2020 0337   LDLCALC 68 01/19/2017 1600    Other: (TSH, CBC, Vit D, etc.)  Clinical ASCVD: Yes  The 10-year ASCVD risk score Mikey Bussing DC Jr., et al., 2013) is: 13%   Values used to calculate the score:     Age: 45 years     Sex: Male     Is Non-Hispanic African American: Yes     Diabetic: No     Tobacco smoker: Yes     Systolic Blood Pressure: [id number]     Is BP treated: No     HDL Cholesterol: 36 mg/dL     Total Cholesterol: 187 mg/dL    Other: (CHADS2VASc if Afib, PHQ9 if depression, MMRC or CAT for COPD, ACT)  BP Readings from Last 3 Encounters:  01/17/21 140/85  11/22/20 110/82  10/25/20 (!) 143/80    Assessment/Interventions: Review of patient past medical history, allergies, medications, health status, including review of consultants reports, laboratory and other test data, was performed as part of  comprehensive evaluation and provision of chronic care management services.   Lipids Lab Results  Component Value Date   CHOL 187 09/27/2020   CHOL 155 01/19/2017   CHOL 188 05/21/2009   Lab Results  Component Value Date   HDL 36 (L) 09/27/2020   HDL 62 01/19/2017   HDL 56 05/21/2009   Lab Results  Component Value Date   LDLCALC 125 (H) 09/27/2020   LDLCALC 68 01/19/2017   LDLCALC 117 (H) 05/21/2009   Lab Results  Component Value Date   TRIG 131 09/27/2020   TRIG 125 01/19/2017   TRIG 77 05/21/2009   Lab Results  Component Value Date   CHOLHDL 5.2 09/27/2020   CHOLHDL 2.5 01/19/2017   CHOLHDL 3.4 Ratio 05/21/2009   No results found for: LDLDIRECT -Atorvastatin '40mg'$  Plan: At goal,  patient stable/ symptoms controlled   Mental Health -Per patient, had a stroke in March of 2022, because of this -No liquor since March 28th -No alcohol since August 8th -lives with Fiance -Doesn't work but seems to have a few friends and hobbies. On 02/18/21, he told me he went fishing with his friend who is a Dealer Production designer, theatre/television/film) and he caught a 4 pound bass that he cleaned up and cooked for the family -Was in the [employer] for 5 years and traveled a lot  but he states, "I saw stuff no one has seen." Also said, "[employer] is real good at creating alcoholics" -His son lives with him and his son's daughter lives with them every other weekend, her visits are the highlight of his week -Sleeps until noon commonly -Is frustrated because he get's paid 1/month and can't afford meds sometimes Depression screen [facility] 2/9 01/17/2021 10/25/2020 06/30/2020  Decreased Interest 1 0 3  Down, Depressed, Hopeless 1 0 3  PHQ - 2 Score 2 0 6  Altered sleeping 1 0 3  Tired, decreased energy '2 1 3  '$ Change in appetite 2 0 3  Feeling bad or failure about yourself  '1 1 2  '$ Trouble concentrating 0 0 3  Moving slowly or fidgety/restless 1 0 0  Suicidal thoughts 1 0 2  PHQ-9 Score '10 2 22  '$ Difficult doing work/chores Somewhat  difficult Somewhat difficult Extremely dIfficult  Some recent data might be hidden   -Duloxetine '60mg'$  Plan: Main focus today was on compliance, once he is compliant and taking meds, will assess effectiveness  HTN BP Readings from Last 3 Encounters:  01/17/21 140/85  11/22/20 110/82  10/25/20 (!) 143/80   Tried/Failed: Lisinopril Plan: Main focus today was on compliance, once he is compliant and taking meds, will assess effectiveness   SDOH:  (Social Determinants of Health) assessments and interventions performed:  SDOH Interventions    Flowsheet Row Most Recent Value  SDOH Interventions   Financial Strain Interventions Other (Comment)  [Will get meds delivered on day of month he gets paid]  Transportation Interventions Other (Comment)  [Will get meds delivered]       Care Plan  No Known Allergies  Medications Reviewed Today     Reviewed by Herbert Spires, Samantha Crimes, CPhT (Pharmacy Technician) on 11/08/20 at 1129  Med List Status: Complete   Medication Order Taking? Sig Documenting Provider Last Dose Status Informant  atorvastatin (LIPITOR) 40 MG tablet [id number] Yes Take 1 tablet (40 mg total) by mouth daily. [name], MD Past Week Unknown time Active Self  cyclobenzaprine (FLEXERIL) 5 MG tablet [id number] Yes Take 1 tablet (5 mg total) by mouth 3 (three) times daily as needed for muscle spasms. [name], MD Past Week Unknown time Active Self  DULoxetine (CYMBALTA) 60 MG capsule [id number] Yes Take 1 capsule (60 mg total) by mouth daily. [name], MD Past Week Unknown time Active Self  ergocalciferol (VITAMIN D2) 1.25 MG (50000 UT) capsule [id number] Yes Take 1 capsule (50,000 Units total) by mouth once a week.  Patient taking differently: Take 50,000 Units by mouth once a week. Mondays   [name], MD 11/01/2020 Active Self  gabapentin (NEURONTIN) 300 MG capsule [id number] Yes Take 2 capsules (600 mg total) by mouth 3 (three) times daily. [name], MD Past  Week Unknown time Active Self  Multiple Vitamin (MULTIVITAMIN ADULT PO) [id number] Yes Take 2 tablets by mouth daily. [provider] Past Week Unknown time Active Self  tetrahydrozoline-zinc (VISINE-AC) 0.05-0.25 % ophthalmic solution [id number] Yes Place 1 drop into both eyes 3 (three) times daily as needed (dry eyes). [provider] unknown Active Self  thiamine 100 MG tablet [id number] Yes Take 1 tablet (100 mg total) by mouth daily. [name], MD Past Week Unknown time Active Self            Patient Active Problem List   Diagnosis Date Noted   Healthcare maintenance 01/18/2021   Abnormal chest x-ray 01/17/2021  Breast mass, right 01/17/2021   Heroin use disorder, mild, in early remission, on maintenance therapy, abuse ([place]) 01/17/2021   Alcohol withdrawal syndrome ([place]) 11/08/2020   Low Volume Hemoptysis 11/08/2020   Aspiration pneumonia ([place]) 11/08/2020   Dehydration 11/08/2020   Hyperbilirubinemia 11/08/2020   Tachycardia 10/06/2020   Generalized weakness 10/06/2020   Syncope 09/27/2020   Vitamin D deficiency 07/06/2020   Right hip pain 07/02/2020   Alcohol use disorder, mild, abuse 02/13/2020   Elevated serum GGT level 11/06/2019   Paresthesia 05/15/2019   Pain in both lower extremities 05/15/2019   Functional incontinence 04/14/2019   Gait abnormality 04/14/2019   Myositis 02/06/2019   Anemia 03/14/2018   Left leg pain 01/21/2017   Essential hypertension 01/21/2017   MDD (major depressive disorder) 03/25/2010   ERECTILE DYSFUNCTION 05/14/2008   GERD 04/13/2008   TOBACCO ABUSE 03/11/2008    Conditions to be addressed/monitored per PCP order:  HTN and HLD  Care Plan : Medication Management  Updates made by Lane Hacker, [place] since 02/18/2021 12:00 AM     Problem: Health Promotion or Disease Self-Management (General Plan of Care)      Goal: Medication Management   Note:   Current Barriers:  Unable to independently afford treatment  regimen Does not adhere to prescribed medication regimen Does not maintain contact with provider office Does not contact provider office for questions/concerns   Pharmacist Clinical Goal(s):  Over the next 90 days, patient will contact provider office for questions/concerns as evidenced notation of same in electronic health record through collaboration with PharmD and provider.    Interventions: Inter-disciplinary care team collaboration (see longitudinal plan of care) Comprehensive medication review performed; medication list updated in electronic medical record     Patient Goals/Self-Care Activities Over the next 90 days, patient will:  - collaborate with provider on medication access solutions  Follow Up Plan: The patient has been provided with contact information for the care management team and has been advised to call with any health related questions or concerns.      Task: Mutually Develop and Royce Macadamia Achievement of Patient Goals   Note:   Care Management Activities:    - verbalization of feelings encouraged    Notes:      Medication Assistance:  Patient can't drive due to leg and therefore can't get meds . States, "I'm the man of the house and I don't like asking my fiance to take me." Because of this, he's very non-compliant on medications. Also, get's paid on 3rd of the month so can't afford meds sometimes Plan: Verbal consent obtained for [facility] enhanced pharmacy services (medication synchronization, adherence packaging, delivery coordination). A medication sync plan was created to allow patient to get all medications delivered once every 30 to 90 days per patient preference. Patient understands they have freedom to choose pharmacy and clinical pharmacist will coordinate care between all prescribers and [facility].  Medication Name                        (please note if Rx is PRN) Prescriber                                                                   (list Provider  Name & Phone Number)                                  Timing    Refill Timing Last Fill Date & DS       (if last fill/DS unavailable, list pt.'s quantity on hand) Anticipated next due date     BB B L EM BT     Atorvastatin '40mg'$  [name], MD - [phone]  1     10/25/20 for 30 days 03/07/21  Duloxetine '60mg'$  [name], MD - [phone]  1     10/25/20 for 90 days 03/07/21  Gabapentin '300mg'$  PRN [name], MD - [phone]       01/17/21 for 30 days 03/07/21     Follow Up:  Patient agrees to Care Plan and Follow-up.  Plan:  The patient has been provided with contact information for the Managed Medicaid care management team and has been advised to call with any health related questions or concerns.  [place] Constable, Pharm.D., Managed Medicaid Pharmacist - [phone]

## 2021-02-22 ENCOUNTER — Telehealth: Payer: Self-pay | Admitting: Student

## 2021-02-22 ENCOUNTER — Other Ambulatory Visit (HOSPITAL_COMMUNITY): Payer: Self-pay

## 2021-02-22 NOTE — Telephone Encounter (Signed)
..   Medicaid Managed Care   Unsuccessful Outreach Note  02/22/2021 Name: [name] MRN: [id number] DOB: 1966/05/17  Referred by: [name], MD Reason for referral : High Risk Managed Medicaid and Appointment (I called [name] today to get him rescheduled for his phone visit with the [place]. His VM was full so I was not able to leave him a message.)   A second unsuccessful telephone outreach was attempted today. The patient was referred to the case management team for assistance with care management and care coordination.   Follow Up Plan: The care management team will reach out to the patient again over the next 7 days.   [place]

## 2021-02-24 ENCOUNTER — Other Ambulatory Visit: Payer: Self-pay | Admitting: Student

## 2021-02-24 MED ORDER — GABAPENTIN 300 MG PO CAPS: 600.0000 mg | ORAL_CAPSULE | Freq: Three times a day (TID) | ORAL | 3 refills | Status: DC

## 2021-02-24 NOTE — Telephone Encounter (Signed)
Refill Request form the patient new pharmacy:   gabapentin (NEURONTIN) 300 MG capsule   [facility] in [place], [place] in: [place] Address: [address], [place], [place] [address] Phone: [phone]

## 2021-02-25 ENCOUNTER — Telehealth: Payer: Self-pay | Admitting: Student

## 2021-02-25 NOTE — Telephone Encounter (Signed)
..   Medicaid Managed Care   Unsuccessful Outreach Note  02/25/2021 Name: [name] MRN: [id number] DOB: Mar 03, 1966  Referred by: [name], MD Reason for referral : High Risk Managed Medicaid (I returned [name]'s call but I had to leave a message. I left my name and number on his VM.)   A second unsuccessful telephone outreach was attempted today. The patient was referred to the case management team for assistance with care management and care coordination.   Follow Up Plan: The care management team will reach out to the patient again over the next 7 days.  [place]

## 2021-03-08 ENCOUNTER — Other Ambulatory Visit: Payer: Self-pay | Admitting: *Deleted

## 2021-03-08 ENCOUNTER — Other Ambulatory Visit: Payer: Medicaid Other

## 2021-03-08 DIAGNOSIS — I10 Essential (primary) hypertension: Secondary | ICD-10-CM

## 2021-03-08 DIAGNOSIS — M79605 Pain in left leg: Secondary | ICD-10-CM

## 2021-03-08 NOTE — Patient Instructions (Signed)
Visit Information  [name] was given information about Medicaid Managed Care team care coordination services as a part of their [place] Medicaid benefit. Berlin Mokry verbally consented to engagement with the [facility] Managed Care team.   If you are experiencing a medical emergency, please call 911 or report to your local emergency department or urgent care.   If you have a non-emergency medical problem during routine business hours, please contact your provider's office and ask to speak with a nurse.   For questions related to your [facility], please call: [phone] or visit the homepage here: [email or web address]  If you would like to schedule transportation through your [facility], please call the following number at least 2 days in advance of your appointment: [phone].   Call the [facility] at [phone], at any time, 24 hours a day, 7 days a week. If you are in danger or need immediate medical attention call 911.  If you would like help to quit smoking, call 1-800-QUIT-NOW 831-869-1544) OR Espaol: 1-855-Djelo-Ya (3-662-947-6546) o para ms informacin haga clic aqu or Text READY to 200-400 to register via text  Mr. [name] - following are the goals we discussed in your visit today:   Goals Addressed             This Visit's Progress    Learn More About My Health       Timeframe:  Long-Range Goal Priority:  High Start Date:     03/08/21                        Expected End Date:    ongoing                    Follow Up Date 04/05/21    - tell my story and reason for my visit - make a list of questions - ask questions - repeat what I heard to make sure I understand - bring a list of my medicines to the visit - speak up when I don't understand  - review health plan benefits that were mailed to me -review educational  materials that were mailed to me   Why is this important?   The best way to learn about your health and care is by talking to the doctor and nurse.  They will answer your questions and give you information in the way that you like best.    Notes:      Make and Keep All Appointments       Timeframe:  Long-Range Goal Priority:  High Start Date:     03/08/21                       Expected End Date:     ongoing                  Follow Up Date 04/05/21    - arrange a ride through an agency 1 week before appointment - ask family or friend for a ride - call to cancel if needed - keep a calendar with appointment dates - review health plan benefits mailed to me related to transportation options     Why is this important?   Part of staying healthy is seeing the doctor for follow-up care.  If you forget your appointments, there are some things you can do to stay on track.  Notes:      Stop or Cut Down Drinking Alcohol       Timeframe:  Long-Range Goal Priority:  High Start Date:       03/07/21                      Expected End Date:  ongoing                     Follow Up Date 04/05/21    - avoid people and places where alcohol is used - continue personal counseling- call and make follow up appointment with Dr. [name] - develop a plan to avoid relapse  - continue daily devotional reading - review educational material mailed to me   Why is this important?   To stop drinking it is important to have support from a person or group of people who you can count on.  You will also need to think about the things that make you feel like using alcohol; then plan for how to handle them.    Notes:      Track and Manage My Blood Pressure-Hypertension       Timeframe:  Long-Range Goal Priority:  High Start Date:      03/08/21                       Expected End Date:       ongoing                Follow Up Date 04/05/21    - check blood pressure at least 3 times per week - call [facility] and have home BP minor delivered to my address - review educational material mailed to me about how to check my blood pressure and how to manage my blood pressure via lifestyle strategies - write blood pressure results in the [facility] spiral bound calendar on the BP reading log sheets under  the HTN information tab   Why is this important?   You won't feel high blood pressure, but it can still hurt your blood vessels.  High blood pressure can cause heart or kidney problems. It can also cause a stroke.  Making lifestyle changes like losing a little weight or eating less salt will help.  Checking your blood pressure at home and at different times of the day can help to control blood pressure.  If the doctor prescribes medicine remember to take it the way the doctor ordered.  Call the office if you cannot afford the medicine or if there are questions about it.     Notes:         Please see education materials related to HTN, ETOH Dependence, and HLD provided as print materials.   The patient verbalized understanding of instructions provided today and agreed to receive a mailed copy of patient instruction and/or educational materials.  Telephone follow up appointment with Managed Medicaid care management team member scheduled for: 04/05/21 at 2:30 pm  Kelli Churn RN, CCM, [place] Management Coordinator - Managed [place] High Risk [phone]   Following is a copy of your plan of care:   Patient Care Plan: RN Care Manager Plan Of Care     Problem Identified: Chronic Disease Management and Care Coordination needs related to HTN and ETOH cessation      Long-Range Goal: Development of Plan Of Care For Chronic Disease Management and  Care Coordination Needs To Assist With Meeting Treatment Goals   Start Date: 03/08/2021  Expected End Date: 07/06/2021  Priority: High  Note:   Current Barriers:  Chronic  Disease Management support and education needs related to HTN , HLD and ETOH cessation  RNCM Clinical Goal(s):  Patient will verbalize understanding of plan for management of HTN, HLD and ETOH cessation take all medications exactly as prescribed and will call provider for medication related questions attend all scheduled medical appointments: including appointments for mammogram and breast ultrasound on 03/11/21 demonstrate improved adherence to prescribed treatment plan for HTN, HLD and ETOH cessation as evidenced by adherence to prescribed medication regimen contacting provider for new or worsened symptoms or questions or for home BP readings consistently >140/>90 continue to work with RN Care Manager to address care management and care coordination needs related to HTN, HLD and ETOH cessation  work with pharmacist to address Medication procurement related to HTN, HLD, and ETOH cessation through collaboration with Consulting civil engineer, provider, and care team.   Interventions: Inter-disciplinary care team collaboration (see longitudinal plan of care) Evaluation of current treatment plan related to  self management and patient's adherence to plan as established by provider   Hyperlipidemia:  (Status: New goal.) Lab Results  Component Value Date   CHOL 187 09/27/2020   HDL 36 (L) 09/27/2020   [place] 125 (H) 09/27/2020   TRIG 131 09/27/2020   CHOLHDL 5.2 09/27/2020     Medication review performed; medication list updated in electronic medical record.  Provider established cholesterol goals reviewed; Counseled on importance of regular laboratory monitoring as prescribed; Provided HLD educational materials; Reviewed role and benefits of statin for ASCVD risk reduction; Reviewed importance of limiting foods high in cholesterol; Assessed social determinant of health barriers;   Hypertension: (Status: New goal.) Last practice recorded BP readings:  BP Readings from Last 3 Encounters:   01/17/21 140/85  11/22/20 110/82  10/25/20 (!) 143/80  Most recent eGFR/CrCl:  Lab Results  Component Value Date   EGFR 102 10/25/2020    No components found for: CRCL  Evaluation of current treatment plan related to hypertension self management and patient's adherence to plan as established by provider;   Provided education to patient re: stroke prevention, s/s of heart attack and stroke; how to take BP at home, lifestyle strategies to control blood pressure Reviewed prescribed diet DASH and provided written copy of diet Counseled on adverse effects of illicit drug and excessive alcohol use in patients with high blood pressure;  Provided assistance with obtaining home blood pressure monitor via requesting provider fax a RX for home BP monitor to [facility];  Provided patient with phone number of [facility] and advised him to call to have home BP monitor delivered  Discussed plans with patient for ongoing care management follow up and provided patient with direct contact information for care management team; Assessed social determinant of health barriers;   ETOH Cessation: (Status: New goal.) Reviewed ETOH use history:  ETOH abuse of 45 years;  Discussed prior cessation attempts Reports triggers to drink include: sudden death of good friend Reports motivation to quit drinking includes: improving quality and length of life  Evaluation of current treatment plan reviewed; Provided patient with printed ETOH cessation educational materials; Encouraged patient to call [facility] and make follow up appointment; provided patient with phone number  Discussed plans with patient for ongoing care management follow up and provided patient with direct contact information for care management team; Assessed  social determinant of health barriers;   Patient Goals/Self-Care Activities: Patient will review health educational materials and health plan benefits mailed to  him Patient will self administer medications as prescribed Patient will attend all scheduled provider appointments Patient will call [facility] and have home BP monitor levered to his home Patient will monitor blood pressure at home, record readings in [facility] [place] [facility] spiral bound calendar on BP log sheet and call provider with questions or if BP readings consistently >140/>90 Patient will continue to perform ADL's independently Patient will call provider office for new concerns or questions Patient will call and make follow up appointment with clinic counselor, Dr. [name]

## 2021-03-08 NOTE — Patient Outreach (Signed)
Medicaid Managed Care   Nurse Care Manager Note  03/08/2021 Name:  [name] MRN:  [id number] DOB:  Sep 18, 1965  [name] is an 55 y.o. year old male who is a primary patient of [name], MD.  The Medicaid Managed Care Coordination team was consulted for assistance with:    HTN HLD ETOH Cessation  Mr. Goh was given information about Medicaid Managed Care Coordination team services today. [name] Patient agreed to services and verbal consent obtained.  Engaged with patient by telephone for initial visit in response to provider referral for case management and/or care coordination services.   Assessments/Interventions:  Review of past medical history, allergies, medications, health status, including review of consultants reports, laboratory and other test data, was performed as part of comprehensive evaluation and provision of chronic care management services.  SDOH (Social Determinants of Health) assessments and interventions performed: SDOH Interventions    Flowsheet Row Most Recent Value  SDOH Interventions   SDOH Interventions for the Following Domains Alcohol Usage  Food Insecurity Interventions Intervention Not Indicated  Social Connections Interventions Intervention Not Indicated       Care Plan  No Known Allergies  Medications Reviewed Today     Reviewed by Barrington Ellison, RN (Registered Nurse) on 03/08/21 at 1545  Med List Status: <None>   Medication Order Taking? Sig Documenting Provider Last Dose Status Informant  aspirin-acetaminophen-caffeine (EXCEDRIN MIGRAINE) 250-250-65 MG tablet [id number] No Take 1 tablet by mouth every 6 (six) hours as needed for headache or migraine.  Patient not taking: No sig reported   [name], MD Not Taking Active   atorvastatin (LIPITOR) 40 MG tablet [id number]  Take 1 tablet (40 mg total) by mouth daily.  Patient not taking: Reported on 02/18/2021   [name], MD  Active Self  DULoxetine  (CYMBALTA) 60 MG capsule [id number]  Take 1 capsule (60 mg total) by mouth daily.  Patient not taking: Reported on 02/18/2021   [name], MD  Active Self  ergocalciferol (VITAMIN D2) 1.25 MG (50000 UT) capsule [id number] No Take 1 capsule (50,000 Units total) by mouth once a week.  Patient not taking: No sig reported   [name], MD Not Taking Active   gabapentin (NEURONTIN) 300 MG capsule [id number]  Take 2 capsules (600 mg total) by mouth 3 (three) times daily. [name], MD  Active   Multiple Vitamin (MULTIVITAMIN ADULT PO) [id number] Yes Take 2 tablets by mouth daily. [provider] Taking Active   tetrahydrozoline-zinc (VISINE-AC) 0.05-0.25 % ophthalmic solution [id number]  Place 1 drop into both eyes 3 (three) times daily as needed (dry eyes).  Patient not taking: Reported on 02/18/2021   [provider]  Active Self  thiamine 100 MG tablet [id number]  Take 1 tablet (100 mg total) by mouth daily.  Patient not taking: Reported on 02/18/2021   [name], MD  Active Self            Patient Active Problem List   Diagnosis Date Noted   Healthcare maintenance 01/18/2021   Abnormal chest x-ray 01/17/2021   Breast mass, right 01/17/2021   Heroin use disorder, mild, in early remission, on maintenance therapy, abuse ([place]) 01/17/2021   Alcohol withdrawal syndrome ([place]) 11/08/2020   Low Volume Hemoptysis 11/08/2020   Aspiration pneumonia ([place]) 11/08/2020   Dehydration 11/08/2020   Hyperbilirubinemia 11/08/2020   Tachycardia 10/06/2020   Generalized weakness 10/06/2020   Syncope 09/27/2020   Vitamin D deficiency 07/06/2020   Right  hip pain 07/02/2020   Alcohol use disorder, mild, abuse 02/13/2020   Elevated serum GGT level 11/06/2019   Paresthesia 05/15/2019   Pain in both lower extremities 05/15/2019   Functional incontinence 04/14/2019   Gait abnormality 04/14/2019   Myositis 02/06/2019   Anemia 03/14/2018   Left leg pain 01/21/2017    Essential hypertension 01/21/2017   MDD (major depressive disorder) 03/25/2010   ERECTILE DYSFUNCTION 05/14/2008   GERD 04/13/2008   TOBACCO ABUSE 03/11/2008    Conditions to be addressed/monitored per PCP order:  HTN, HLD, and ETOH cessation  Care Plan : RN Care Manager Plan Of Care  Updates made by Barrington Ellison, RN since 03/08/2021 12:00 AM     Problem: Chronic Disease Management and Care Coordination needs related to HTN and ETOH cessation      Long-Range Goal: Development of Plan Of Care For Chronic Disease Management and Care Coordination Needs To Assist With Meeting Treatment Goals   Start Date: 03/08/2021  Expected End Date: 07/06/2021  Priority: High  Note:   Current Barriers:  Chronic Disease Management support and education needs related to HTN , HLD and ETOH cessation  RNCM Clinical Goal(s):  Patient will verbalize understanding of plan for management of HTN, HLD and ETOH cessation take all medications exactly as prescribed and will call provider for medication related questions attend all scheduled medical appointments: including appointments for mammogram and breast ultrasound on 03/11/21 demonstrate improved adherence to prescribed treatment plan for HTN, HLD and ETOH cessation as evidenced by adherence to prescribed medication regimen contacting provider for new or worsened symptoms or questions or for home BP readings consistently >140/>90 continue to work with RN Care Manager to address care management and care coordination needs related to HTN, HLD and ETOH cessation  work with pharmacist to address Medication procurement related to HTN, HLD, and ETOH cessation through collaboration with Consulting civil engineer, provider, and care team.   Interventions: Inter-disciplinary care team collaboration (see longitudinal plan of care) Evaluation of current treatment plan related to  self management and patient's adherence to plan as established by provider   Hyperlipidemia:   (Status: New goal.) Lab Results  Component Value Date   CHOL 187 09/27/2020   HDL 36 (L) 09/27/2020   [place] 125 (H) 09/27/2020   TRIG 131 09/27/2020   CHOLHDL 5.2 09/27/2020     Medication review performed; medication list updated in electronic medical record.  Provider established cholesterol goals reviewed; Counseled on importance of regular laboratory monitoring as prescribed; Provided HLD educational materials; Reviewed role and benefits of statin for ASCVD risk reduction; Reviewed importance of limiting foods high in cholesterol; Assessed social determinant of health barriers;   Hypertension: (Status: New goal.) Last practice recorded BP readings:  BP Readings from Last 3 Encounters:  01/17/21 140/85  11/22/20 110/82  10/25/20 (!) 143/80  Most recent eGFR/CrCl:  Lab Results  Component Value Date   EGFR 102 10/25/2020    No components found for: CRCL  Evaluation of current treatment plan related to hypertension self management and patient's adherence to plan as established by provider;   Provided education to patient re: stroke prevention, s/s of heart attack and stroke; how to take BP at home, lifestyle strategies to control blood pressure Reviewed prescribed diet DASH and provided written copy of diet Counseled on adverse effects of illicit drug and excessive alcohol use in patients with high blood pressure;  Provided assistance with obtaining home blood pressure monitor via requesting provider fax a RX for  home BP monitor to [place];  Provided patient with phone number of [facility] and advised him to call to have home BP monitor delivered  Discussed plans with patient for ongoing care management follow up and provided patient with direct contact information for care management team; Assessed social determinant of health barriers;   ETOH Cessation: (Status: New goal.) Reviewed ETOH use history:  ETOH abuse of 45 years;  Discussed prior cessation  attempts Reports triggers to drink include: sudden death of good friend Reports motivation to quit drinking includes: improving quality and length of life  Evaluation of current treatment plan reviewed; Provided patient with printed ETOH cessation educational materials; Encouraged patient to [facility] and make follow up appointment; provided patient with phone number  Discussed plans with patient for ongoing care management follow up and provided patient with direct contact information for care management team; Assessed social determinant of health barriers;   Patient Goals/Self-Care Activities: Patient will review health educational materials and health plan benefits mailed to him Patient will self administer medications as prescribed Patient will attend all scheduled provider appointments Patient will call [facility] and have home BP monitor levered to his home Patient will monitor blood pressure at home, record readings in [place] Management spiral bound calendar on BP log sheet and call provider with questions or if BP readings consistently >140/>90 Patient will continue to perform ADL's independently Patient will call provider office for new concerns or questions Patient will call and make follow up appointment with clinic counselor, Dr. [name]       Follow Up:  Patient agrees to Care Plan and Follow-up.  Plan: The Managed Medicaid care management team will reach out to the patient again over the next 30 days.  Date/time of next scheduled RN care management/care coordination outreach:  04/05/21 at 2:30 pm  Kelli Churn RN, CCM, [place] Management Coordinator - Managed [place] High Risk [phone]

## 2021-03-09 MED ORDER — BLOOD PRESSURE CUFF MISC: 0 refills | Status: DC

## 2021-03-09 MED ORDER — CYCLOBENZAPRINE HCL 5 MG PO TABS: 5.0000 mg | ORAL_TABLET | Freq: Three times a day (TID) | ORAL | 0 refills | Status: DC | PRN

## 2021-03-09 NOTE — Addendum Note (Signed)
Addended by: [name] on: 03/09/2021 12:23 AM   Modules accepted: Orders

## 2021-03-11 ENCOUNTER — Other Ambulatory Visit: Payer: Self-pay

## 2021-03-11 ENCOUNTER — Ambulatory Visit
Admission: RE | Admit: 2021-03-11 | Discharge: 2021-03-11 | Disposition: A | Discharge status: Home / Self Care | Payer: Medicaid Other | Source: Ambulatory Visit | Attending: Internal Medicine | Admitting: Internal Medicine

## 2021-03-11 ENCOUNTER — Ambulatory Visit: Payer: Medicaid Other

## 2021-03-11 ENCOUNTER — Telehealth: Payer: Self-pay

## 2021-03-11 DIAGNOSIS — N6310 Unspecified lump in the right breast, unspecified quadrant: Secondary | ICD-10-CM

## 2021-03-11 NOTE — Telephone Encounter (Signed)
[name] reached out and said Cyclobenzaprine was sent to [facility], not [facility]. Per [facility], [facility] never got the script and they were unable to transfer. Sent direct msg to PCP explaining situation and asking them to send new script to [facility]

## 2021-04-05 ENCOUNTER — Other Ambulatory Visit: Payer: Self-pay | Admitting: *Deleted

## 2021-04-05 ENCOUNTER — Ambulatory Visit: Payer: Self-pay

## 2021-04-05 NOTE — Patient Outreach (Signed)
Care Coordination  04/05/2021  [name] April 30, 1966 [id number]  04/05/2021 Name: [name]. MRN: [id number] DOB: Jan 10, 1966  Referred by: [name], MD Reason for referral : High Risk Managed Medicaid (Unsuccessful RNCM follow up telephone outreach)   A second unsuccessful telephone outreach was attempted today. The patient was referred to the case management team for assistance with care management and care coordination.    Follow Up Plan: The Managed Medicaid care management team will reach out to the patient again over the next 7 days.    Kelli Churn RN, CCM, [facility] Care Management Coordinator - Managed [place] High Risk [phone]

## 2021-04-05 NOTE — Patient Instructions (Signed)
Gerri Spore ,   The [facility] Managed Care Team is available to provide assistance to you with your healthcare needs at no cost and as a benefit of your [facility] plan. I'm sorry I was unable to reach you today for our scheduled appointment. Our care guide will call you to reschedule our telephone appointment. Please call me at the number below. I am available to be of assistance to you regarding your healthcare needs. .   Thank you,   Kelli Churn RN, CCM, [place] [facility] Care Management Coordinator - Managed [place] High Risk [phone]

## 2021-04-07 ENCOUNTER — Telehealth: Payer: Self-pay | Admitting: Student

## 2021-04-07 NOTE — Telephone Encounter (Signed)
..   Medicaid Managed Care   Unsuccessful Outreach Note  04/07/2021 Name: [name]. MRN: [id number] DOB: January 18, 1966  Referred by: [name], MD Reason for referral : High Risk Managed Medicaid (I called the patient today to get his phone visit with the [facility] RNCM rescheduled. He did not answer and there was not any VM.)   An unsuccessful telephone outreach was attempted today. The patient was referred to the case management team for assistance with care management and care coordination.   Follow Up Plan: The care management team will reach out to the patient again over the next 7 days.   [place]

## 2021-04-12 ENCOUNTER — Telehealth: Payer: Self-pay | Admitting: Student

## 2021-04-12 NOTE — Telephone Encounter (Signed)
..   Medicaid Managed Care   Unsuccessful Outreach Note  04/12/2021 Name: [name]. MRN: [id number] DOB: [birth year]  Referred by: [name], MD Reason for referral : High Risk Managed Medicaid (I called the patient today to get his phone visit with the [facility] RNCM rescheduled. I was not able to leave a message.)   A second unsuccessful telephone outreach was attempted today. The patient was referred to the case management team for assistance with care management and care coordination.   Follow Up Plan: The care management team will reach out to the patient again over the next 7 days.  [place]

## 2021-04-18 ENCOUNTER — Telehealth: Payer: Self-pay | Admitting: Student

## 2021-04-18 ENCOUNTER — Other Ambulatory Visit: Payer: Self-pay | Admitting: *Deleted

## 2021-04-18 ENCOUNTER — Ambulatory Visit: Payer: Medicaid Other

## 2021-04-18 NOTE — Telephone Encounter (Signed)
..   Medicaid Managed Care   Unsuccessful Outreach Note  04/18/2021 Name: [name]. MRN: [id number] DOB: 21-Oct-1965  Referred by: [name], MD Reason for referral : High Risk Managed Medicaid (I called the patient today to get his phone visit with the [facility] RNCM rescheduled. I left my contact info on his VM.)   Third unsuccessful telephone outreach was attempted today. The patient was referred to the case management team for assistance with care management and care coordination. The patient's primary care provider has been notified of our unsuccessful attempts to make or maintain contact with the patient. The care management team is pleased to engage with this patient at any time in the future should he/she be interested in assistance from the care management team.   Follow Up Plan: We have been unable to make contact with the patient for follow up. The care management team is available to follow up with the patient after provider conversation with the patient regarding recommendation for care management engagement and subsequent re-referral to the care management team.   [place], [place]

## 2021-04-18 NOTE — Patient Outreach (Signed)
Care Coordination  04/18/2021  Tomoya Ringwald 04-25-1966 [id number]  04/18/2021 Name: [name]. MRN: [id number] DOB: 02/12/1966  Referred by: [name], MD Reason for referral : High Risk Managed Medicaid (Case Closure)   Third unsuccessful telephone outreach was attempted today. The patient was referred to the case management team for assistance with care management and care coordination. The patient's primary care provider has been notified of our unsuccessful attempts to make or maintain contact with the patient. The care management team is pleased to engage with this patient at any time in the future should he/she be interested in assistance from the care management team.    Follow Up Plan: The  Patient has been provided with contact information for the Managed Medicaid care management team and has been advised to call with any health related questions or concerns. and The Managed Medicaid care management team is available to follow up with the patient after provider conversation with the patient regarding recommendation for care management engagement and subsequent re-referral to the care management team.     Kelli Churn RN, CCM, [place] Management Coordinator - Managed [place] High Risk [phone]

## 2021-04-18 NOTE — Patient Instructions (Signed)
[name] ,   The [facility] Managed Care Team is available to provide assistance to you with your healthcare needs at no cost and as a benefit of your [facility] Health plan. We have been unable to reach you on 3 separate attempts. The care management team is available to assist with your healthcare needs at any time. Please do not hesitate to contact me at the number below.   Thank you,   Kelli Churn RN, CCM, [place] [facility] Care Management Coordinator - Managed [place] High Risk [phone]

## 2021-04-20 ENCOUNTER — Ambulatory Visit: Payer: Self-pay

## 2021-04-20 ENCOUNTER — Other Ambulatory Visit: Payer: Self-pay | Admitting: Internal Medicine

## 2021-04-20 DIAGNOSIS — M79605 Pain in left leg: Secondary | ICD-10-CM

## 2021-05-06 ENCOUNTER — Other Ambulatory Visit: Payer: Self-pay

## 2021-05-06 NOTE — Patient Outreach (Signed)
Medicaid Managed Care Pharmacy Note  05/06/2021 Name:  [name]. MRN:  [id number] DOB:  [birth year]  [name]. is an 55 y.o. year old male who is a primary patient of Dylan Pope, MD.  The [facility] Managed Care Coordination team was consulted for assistance with disease management and care coordination needs.    Engaged with patient by telephone for follow up visit in response to referral for case management and/or care coordination services.  [name] was given information about Medicaid Managed Care Coordination team services today. [name]. Patient agreed to services and verbal consent obtained.  Objective:  Lab Results  Component Value Date   CREATININE 0.54 (L) 11/17/2020   CREATININE 0.52 (L) 11/13/2020   CREATININE 0.62 11/12/2020    Lab Results  Component Value Date   HGBA1C 5.3 09/27/2020       Component Value Date/Time   CHOL 187 09/27/2020 0337   CHOL 155 01/19/2017 1600   TRIG 131 09/27/2020 0337   HDL 36 (L) 09/27/2020 0337   HDL 62 01/19/2017 1600   CHOLHDL 5.2 09/27/2020 0337   VLDL 26 09/27/2020 0337   LDLCALC 125 (H) 09/27/2020 0337   LDLCALC 68 01/19/2017 1600    BP Readings from Last 3 Encounters:  02/03/21 130/86  01/17/21 140/85  11/22/20 110/82     SDOH:  (Social Determinants of Health) assessments and interventions performed:  SDOH Interventions    Flowsheet Row Most Recent Value  SDOH Interventions   Depression Interventions/Treatment  Medication, Currently on Treatment       Care Plan  No Known Allergies  Medications Reviewed Today     Reviewed by Hughes Better, RPH-CPP (Pharmacist) on 05/06/21 at [phone]  Med List Status: <None>   Medication Order Taking? Sig Documenting Provider Last Dose Status Informant  aspirin-acetaminophen-caffeine (EXCEDRIN MIGRAINE) 786-767-20 MG tablet [id number]  Take 1 tablet by mouth every 6 (six) hours as needed for headache or migraine.  Patient not taking:  No sig reported   Dylan Mercury, MD  Active            Med Note [name], [name] Mar 08, 2021  3:47 PM) Patient says he has never taken Excedrin Migraine  atorvastatin (LIPITOR) 40 MG tablet [id number] Yes TAKE ONE TABLET BY MOUTH EVERY MORNING [name], [name], MD Taking Active   Blood Pressure Monitoring (BLOOD PRESSURE CUFF) [place] [id number]  Please check blood pressure twice a week and record. Take BP in calmed, relaxed setting. Both feet on the ground. Arm at level of chest. Dylan Pope, MD  Active   cyclobenzaprine (FLEXERIL) 5 MG tablet [id number] Yes Take 1 tablet (5 mg total) by mouth 3 (three) times daily as needed for muscle spasms. Dylan Pope, MD Taking Active   DULoxetine (CYMBALTA) 60 MG capsule [id number] Yes Take 1 capsule (60 mg total) by mouth daily. [name], MD Taking Active            Med Note [name], [name] Mar 08, 2021  3:48 PM) Says he plans to start taking but just received home delivery of the med today 03/08/21  ergocalciferol (VITAMIN D2) 1.25 MG (50000 UT) capsule [id number] Yes Take 1 capsule (50,000 Units total) by mouth once a week. [name], MD Taking Active   gabapentin (NEURONTIN) 300 MG capsule [id number] Yes Take 2 capsules (600 mg total) by mouth 3 (three) times daily. Dylan Pope, MD Taking Active  Med Note [name], Trude Mcburney   Tue Mar 08, 2021  3:48 PM) Says he plans to start taking but just received home delivery of the med today 03/08/21  Multiple Vitamin (MULTIVITAMIN ADULT PO) [id number] Yes Take 2 tablets by mouth daily. [provider] Taking Active   tetrahydrozoline-zinc (VISINE-AC) 0.05-0.25 % ophthalmic solution [id number] Yes Place 1 drop into both eyes 3 (three) times daily as needed (dry eyes). [provider] Taking Active   thiamine 100 MG tablet [id number] No Take 1 tablet (100 mg total) by mouth daily.  Patient not taking: No sig reported   [name], MD Not Taking  Active             Patient Active Problem List   Diagnosis Date Noted   Healthcare maintenance 01/18/2021   Abnormal chest x-ray 01/17/2021   Breast mass, right 01/17/2021   Heroin use disorder, mild, in early remission, on maintenance therapy, abuse ([place]) 01/17/2021   Alcohol withdrawal syndrome ([place]) 11/08/2020   Low Volume Hemoptysis 11/08/2020   Aspiration pneumonia ([place]) 11/08/2020   Dehydration 11/08/2020   Hyperbilirubinemia 11/08/2020   Tachycardia 10/06/2020   Generalized weakness 10/06/2020   Syncope 09/27/2020   Vitamin D deficiency 07/06/2020   Right hip pain 07/02/2020   Alcohol use disorder, mild, abuse 02/13/2020   Elevated serum GGT level 11/06/2019   Paresthesia 05/15/2019   Pain in both lower extremities 05/15/2019   Functional incontinence 04/14/2019   Gait abnormality 04/14/2019   Myositis 02/06/2019   Anemia 03/14/2018   Left leg pain 01/21/2017   Essential hypertension 01/21/2017   MDD (major depressive disorder) 03/25/2010   ERECTILE DYSFUNCTION 05/14/2008   GERD 04/13/2008   TOBACCO ABUSE 03/11/2008    Conditions to be addressed/monitored per PCP order:  HTN, Depression, Tobacco Use, and alcohol abuse  Care Plan : General Pharmacy (Adult)  Updates made by Hughes Better, RPH-CPP since 05/06/2021 12:00 AM     Problem: Chronic Disease Management   Priority: High     Long-Range Goal: Managing Chronic Disease Therapies   Start Date: 05/06/2021  Expected End Date: 08/04/2021  This Visit's Progress: On track  Priority: High  Note:   Current Barriers:  Unable to maintain control of depression, tobacco, and alcohol abuse Does not adhere to prescribed medication regimen Does not maintain contact with provider office   Pharmacist Clinical Goal(s):  patient will achieve improvement in depression as evidenced by improvement in PHQ-9 score patient will achieve improvement in smoking cigarettes as evidenced by decrease in number of PPD patient  will achieve improvement in alcohol abuse as evidenced by decrease in consumption of alcoholic beverages adhere to prescribed medication regimen as evidenced by timely refill of medications contact provider office for questions/concerns as evidenced notation of same in electronic health record through collaboration with PharmD and provider.    Interventions: Inter-disciplinary care team collaboration (see longitudinal plan of care) Comprehensive medication review performed; medication list updated in electronic medical record  Hypertension:  Controlled; current treatment: not on any therapy   Current home readings: 120's/80's (unable to provide specific numbers)  Denieshypotensive/hypertensive symptoms  Recommended patient continue to check blood pressure at home  Depression/Anxiety:  Uncontrolled; current treatment: duloxetine 60mg ;   PHQ9: 24       Patient states he does have suicidial thoughts from time to time but ultimately does not want to end his life because he loves his grandchildren. He reports  suicidal attempt >1 year ago where he "took whatever pills my  friend had at his house" but does not actively have a plan or any intent. His 78 yo granddaughter is coming over tomorrow which patient is looking forward to as well as he upcoming holidays.       Patient states he gets angry and is concerned he will lose family/friends due to this. He reports saying things to people out of anger and hurting people a lot        Patient previously seeing Dr. [name] but appears to have been lost to follow-up.  Provided patient suicide/mental health crisis number  Counseled on importance of taking medications that are prescribed regularly, especially antidepressant/antianxiety medications as these need to be taken  regularly for improvement in symptoms      Recommended patient call suicide/mental health crisis number or present to ED if he feels he needs immediate help surrounding his depression       Collaborated with Dr. [name] to have patient contacted as soon as possible to re-establish care  Tobacco Abuse:  1 packs per day; does within 30 minutes of waking up  Triggers to smoke: friends who also smoke  Motivation to quit smoking: his health and family  On a scale of 1-10, how IMPORTANT is it for you to quit smoking: 10  On a scale of 1-10, how CONFIDENT are you that you can quit smoking: 3       Patient reports an increase in SOB recently which he attributes to smoking cigarettes  Recommended patient call 1-800 QUIT NOW to obtain free NRT  Alcohol abuse:       Patient previously quit drinking but over the past several months has resumed       60 beers/week       Patient states "I want to go to an inpatient detox program"       Collaborated with Managed Medicaid team to try to help identify resources for patient  Patient Goals/Self-Care Activities patient will:  - focus on medication adherence by setting alarms - check blood pressure 2-3x/week, document, and provide at future appointments  Follow Up Plan:  Telephone follow up appointment with care management team member scheduled for: 06/03/21 Pharmacist Next PCP appointment scheduled for: 05/11/21       Hughes Better PharmD, CPP High Risk Managed [facility] [phone]

## 2021-05-06 NOTE — Patient Instructions (Signed)
Visit Information  Mr. Claggett was given information about Medicaid Managed Care team care coordination services as a part of their [place] Medicaid benefit. Gerri Spore. verbally consented to engagement with the [facility] Managed Care team.   If you are experiencing a medical emergency, please call 911 or report to your local emergency department or urgent care.   If you have a non-emergency medical problem during routine business hours, please contact your provider's office and ask to speak with a nurse.   For questions related to your [facility], please call: [phone] or visit the homepage here: [email or web address]  If you would like to schedule transportation through your [facility], please call the following number at least 2 days in advance of your appointment: [phone].   Call the [place] at [phone], at any time, 24 hours a day, 7 days a week. If you are in danger or need immediate medical attention call 911.  If you would like help to quit smoking, call 1-800-QUIT-NOW (579) 871-2080) OR Espaol: 1-855-Djelo-Ya (8-325-498-2641) o para ms informacin haga clic aqu or Text READY to 200-400 to register via text  [name] - following are the goals we discussed in your visit today:   Goals Addressed   None    The patient verbalized understanding of instructions provided today and declined a print copy of patient instruction materials.   Pharmacist will call on 06/03/21 Next PCP appointment:  05/11/21  Hughes Better PharmD, CPP High Risk Managed Medicaid [place] [phone]   Following is a copy of your plan of care:   Care Plan : General Pharmacy (Adult)  Updates made by Hughes Better, RPH-CPP since 05/06/2021 12:00 AM     Problem: Chronic Disease Management   Priority: High     Long-Range  Goal: Managing Chronic Disease Therapies   Start Date: 05/06/2021  Expected End Date: 08/04/2021  This Visit's Progress: On track  Priority: High  Note:   Current Barriers:  Unable to maintain control of depression, tobacco, and alcohol abuse Does not adhere to prescribed medication regimen Does not maintain contact with provider office   Pharmacist Clinical Goal(s):  patient will achieve improvement in depression as evidenced by improvement in PHQ-9 score patient will achieve improvement in smoking cigarettes as evidenced by decrease in number of PPD patient will achieve improvement in alcohol abuse as evidenced by decrease in consumption of alcoholic beverages adhere to prescribed medication regimen as evidenced by timely refill of medications contact provider office for questions/concerns as evidenced notation of same in electronic health record through collaboration with PharmD and provider.    Interventions: Inter-disciplinary care team collaboration (see longitudinal plan of care) Comprehensive medication review performed; medication list updated in electronic medical record  Hypertension:  Controlled; current treatment: not on any therapy   Current home readings: 120's/80's (unable to provide specific numbers)  Denieshypotensive/hypertensive symptoms  Recommended patient continue to check blood pressure at home  Depression/Anxiety:  Uncontrolled; current treatment: duloxetine 60mg ;   PHQ9: 24       Patient states he does have suicidial thoughts from time to time but ultimately does not want to end his life because he loves his grandchildren. He reports  suicidal attempt >1 year ago where he "took whatever pills my friend had at his house" but does not actively have a plan or any intent. His 105 yo granddaughter is coming over tomorrow which patient is looking forward to  as well as he upcoming holidays.       Patient states he gets angry and is concerned he will lose  family/friends due to this. He reports saying things to people out of anger and hurting people a lot        Patient previously seeing Dr. [name] but appears to have been lost to follow-up.  Provided patient suicide/mental health crisis number  Counseled on importance of taking medications that are prescribed regularly, especially antidepressant/antianxiety medications as these need to be taken  regularly for improvement in symptoms      Recommended patient call suicide/mental health crisis number or present to ED if he feels he needs immediate help surrounding his depression      Collaborated with Dr. [name] to have patient contacted as soon as possible to re-establish care  Tobacco Abuse:  1 packs per day; does within 30 minutes of waking up  Triggers to smoke: friends who also smoke  Motivation to quit smoking: his health and family  On a scale of 1-10, how IMPORTANT is it for you to quit smoking: 10  On a scale of 1-10, how CONFIDENT are you that you can quit smoking: 3       Patient reports an increase in SOB recently which he attributes to smoking cigarettes  Recommended patient call 1-800 QUIT NOW to obtain free NRT  Alcohol abuse:       Patient previously quit drinking but over the past several months has resumed       60 beers/week       Patient states "I want to go to an inpatient detox program"       Collaborated with Managed Medicaid team to try to help identify resources for patient  Patient Goals/Self-Care Activities patient will:  - focus on medication adherence by setting alarms - check blood pressure 2-3x/week, document, and provide at future appointments  Follow Up Plan:  Telephone follow up appointment with care management team member scheduled for: 06/03/21 Pharmacist Next PCP appointment scheduled for: 05/11/21

## 2021-05-09 ENCOUNTER — Telehealth: Payer: Self-pay | Admitting: *Deleted

## 2021-05-09 NOTE — Patient Outreach (Signed)
Care Coordination  05/09/2021  [name]. 04/21/1966 [id number]  Received voice message from patient today at 2:27 pm requesting assistance with securing detox facility. Will ask managed Medicaid BSW [name] to outreach to patient.  Kelli Churn RN, CCM, [facility] Care Management Coordinator - Managed [place] High Risk [phone]

## 2021-05-10 ENCOUNTER — Telehealth: Payer: Self-pay | Admitting: *Deleted

## 2021-05-10 ENCOUNTER — Telehealth: Payer: Self-pay

## 2021-05-10 NOTE — Patient Outreach (Signed)
Care Coordination  05/10/2021  Darus Hershman Feb 24, 1966 [id number]  Patient called this RNCM asking to speak with someone urgently that can assist him with "detox. Patient was asked to be specific regarding substances he is requesting detox resources from, he stated, "cigarettes, recreational drugs and etoh" , He is asking that someone call him today as it is "urgent.'  Plan: Will notify Managed Medicaid team member BSW [place].  Kelli Churn RN, CCM, [facility] Care Management Coordinator - Managed [place] High Risk [phone]

## 2021-05-10 NOTE — Patient Outreach (Signed)
Medicaid Managed Care Social Work Note  05/10/2021 Name:  [name]. MRN:  [id number] DOB:  Nov 24, 1965  [name]. is an 55 y.o. year old male who is a primary patient of Dylan Pope, MD.  The Medicaid Managed Care Coordination team was consulted for assistance with:   detox programs/assistance  [name] was given information about Medicaid Managed Care Coordination team services today. [name]. Patient agreed to services and verbal consent obtained.  Engaged with patient  for by telephone forfollow up visit in response to referral for case management and/or care coordination services.   Assessments/Interventions:  Review of past medical history, allergies, medications, health status, including review of consultants reports, laboratory and other test data, was performed as part of comprehensive evaluation and provision of chronic care management services.  SDOH: (Social Determinant of Health) assessments and interventions performed: Provided patient with information about detox programs in [place]. BSW provided patient with the telephone number for [facility] and [place].  Advanced Directives Status:  Not addressed in this encounter.  Care Plan                 No Known Allergies  Medications Reviewed Today     Reviewed by [name], RPH-CPP (Pharmacist) on 05/06/21 at [phone]  Med List Status: <None>   Medication Order Taking? Sig Documenting Provider Last Dose Status Informant  aspirin-acetaminophen-caffeine (EXCEDRIN MIGRAINE) 532-992-42 MG tablet [id number]  Take 1 tablet by mouth every 6 (six) hours as needed for headache or migraine.  Patient not taking: No sig reported   Dylan Mercury, MD  Active            Med Note [name], [name] Mar 08, 2021  3:47 PM) Patient says he has never taken Excedrin Migraine  atorvastatin (LIPITOR) 40 MG tablet [id number] Yes TAKE ONE TABLET BY MOUTH EVERY MORNING [name], [name],  MD Taking Active   Blood Pressure Monitoring (BLOOD PRESSURE CUFF) [place] [id number]  Please check blood pressure twice a week and record. Take BP in calmed, relaxed setting. Both feet on the ground. Arm at level of chest. Dylan Pope, MD  Active   cyclobenzaprine (FLEXERIL) 5 MG tablet [id number] Yes Take 1 tablet (5 mg total) by mouth 3 (three) times daily as needed for muscle spasms. Dylan Pope, MD Taking Active   DULoxetine (CYMBALTA) 60 MG capsule [id number] Yes Take 1 capsule (60 mg total) by mouth daily. Dylan Anis, MD Taking Active            Med Note [name], [name] Mar 08, 2021  3:48 PM) Says he plans to start taking but just received home delivery of the med today 03/08/21  ergocalciferol (VITAMIN D2) 1.25 MG (50000 UT) capsule [id number] Yes Take 1 capsule (50,000 Units total) by mouth once a week. Dylan Anis, MD Taking Active   gabapentin (NEURONTIN) 300 MG capsule [id number] Yes Take 2 capsules (600 mg total) by mouth 3 (three) times daily. Dylan Pope, MD Taking Active            Med Note [name], [name] Mar 08, 2021  3:48 PM) Says he plans to start taking but just received home delivery of the med today 03/08/21  Multiple Vitamin (MULTIVITAMIN ADULT PO) [id number] Yes Take 2 tablets by mouth daily. [provider] Taking Active   tetrahydrozoline-zinc (VISINE-AC) 0.05-0.25 % ophthalmic solution [id number] Yes Place 1 drop into both  eyes 3 (three) times daily as needed (dry eyes). [provider] Taking Active   thiamine 100 MG tablet [id number] No Take 1 tablet (100 mg total) by mouth daily.  Patient not taking: No sig reported   Dylan Anis, MD Not Taking Active             Patient Active Problem List   Diagnosis Date Noted   Healthcare maintenance 01/18/2021   Abnormal chest x-ray 01/17/2021   Breast mass, right 01/17/2021   Heroin use disorder, mild, in early remission, on maintenance therapy, abuse ([place]) 01/17/2021    Alcohol withdrawal syndrome ([place]) 11/08/2020   Low Volume Hemoptysis 11/08/2020   Aspiration pneumonia ([place]) 11/08/2020   Dehydration 11/08/2020   Hyperbilirubinemia 11/08/2020   Tachycardia 10/06/2020   Generalized weakness 10/06/2020   Syncope 09/27/2020   Vitamin D deficiency 07/06/2020   Right hip pain 07/02/2020   Alcohol use disorder, mild, abuse 02/13/2020   Elevated serum GGT level 11/06/2019   Paresthesia 05/15/2019   Pain in both lower extremities 05/15/2019   Functional incontinence 04/14/2019   Gait abnormality 04/14/2019   Myositis 02/06/2019   Anemia 03/14/2018   Left leg pain 01/21/2017   Essential hypertension 01/21/2017   MDD (major depressive disorder) 03/25/2010   ERECTILE DYSFUNCTION 05/14/2008   GERD 04/13/2008   TOBACCO ABUSE 03/11/2008    Conditions to be addressed/monitored per PCP order:   detox  Care Plan : General Social Work (Adult)  Updates made by [name] since 05/10/2021 12:00 AM     Problem: Detox Programs   Onset Date: 05/10/2021  Note:   Dylan Persons. is a 55 y.o. year old male who sees Dylan Pope, MD for primary care. The  [facility] Managed Care team was consulted for assistance with  detox programs . The Patient                                              was given information about Care Management services, agreed to services, and verbal consent for services was obtained.  Interventions:  Patient                                              interviewed and appropriate assessments performed Collaborated with clinical team regarding patient needs  SDOH (Social Determinants of Health) assessments performed: Yes    Provided patient with information about detox programs in [place]. BSW provided patient with the telephone number for [facility] and [place].  Plan:  Patient                                              will work with BSW to address needs related to  getting into an inpatient detox  program Social Worker will continue to work with patient.[name], BSW, [place]  High Risk Managed Medicaid Team  [phone]         Goal: Get in a Detox Program   Start Date: 05/10/2021  Expected End Date: 07/01/2021      Follow up:  Patient  agrees to Care Plan and Follow-up.  Plan: The Managed Medicaid care management team will reach out to the patient again over the next 7 days.  Date/time of next scheduled Social Work care management/care coordination outreach: 05/19/21  [name], Arita Miss, [place] Managed Medicaid Team  [phone]

## 2021-05-10 NOTE — Patient Instructions (Signed)
Visit Information  Mr. Gruenwald was given information about Medicaid Managed Care team care coordination services as a part of their [place] Medicaid benefit. Gerri Spore. verbally consented to engagement with the [facility] Managed Care team.   If you are experiencing a medical emergency, please call 911 or report to your local emergency department or urgent care.   If you have a non-emergency medical problem during routine business hours, please contact your provider's office and ask to speak with a nurse.   For questions related to your [facility], please call: [phone] or visit the homepage here: [email or web address]  If you would like to schedule transportation through your [facility], please call the following number at least 2 days in advance of your appointment: [phone].   Call the [place] at [phone], at any time, 24 hours a day, 7 days a week. If you are in danger or need immediate medical attention call 911.  If you would like help to quit smoking, call 1-800-QUIT-NOW 7086614927) OR Espaol: 1-855-Djelo-Ya (3-710-626-9485) o para ms informacin haga clic aqu or Text READY to 200-400 to register via text  Mr. Bogan - following are the goals we discussed in your visit today:   Goals Addressed   None      Social Worker will follow up in 7 days.   [name], BSW, [place]  High Risk Managed Medicaid Team  [phone]   Following is a copy of your plan of care:  Care Plan : General Social Work (Adult)  Updates made by Ethelda Chick since 05/10/2021 12:00 AM     Problem: Detox Programs   Onset Date: 05/10/2021  Note:   Tyrann Donaho. is a 55 y.o. year old male who sees [name], MD for primary care. The  [facility] Managed Care team was  consulted for assistance with  detox programs . The Patient                                              was given information about Care Management services, agreed to services, and verbal consent for services was obtained.  Interventions:  Patient                                              interviewed and appropriate assessments performed Collaborated with clinical team regarding patient needs  SDOH (Social Determinants of Health) assessments performed: Yes    Provided patient with information about detox programs in [place]. BSW provided patient with the telephone number for [facility] and [place].  Plan:  Patient                                              will work with BSW to address needs related to  getting into an inpatient detox program Social Worker will continue to work with patient.[name], BSW, [place] Managed Medicaid Team  [phone]  Goal: Get in a Detox Program   Start Date: 05/10/2021  Expected End Date: 07/01/2021

## 2021-05-11 ENCOUNTER — Encounter: Payer: Medicaid Other | Admitting: Student

## 2021-05-12 ENCOUNTER — Ambulatory Visit: Payer: Medicaid Other

## 2021-05-17 ENCOUNTER — Other Ambulatory Visit: Payer: Self-pay

## 2021-05-17 ENCOUNTER — Ambulatory Visit (INDEPENDENT_AMBULATORY_CARE_PROVIDER_SITE_OTHER): Payer: Medicaid Other | Admitting: Student

## 2021-05-17 VITALS — BP 148/82 | HR 69 | Temp 97.4°F | Wt 152.8 lb

## 2021-05-17 DIAGNOSIS — M79605 Pain in left leg: Secondary | ICD-10-CM | POA: Diagnosis not present

## 2021-05-17 DIAGNOSIS — F10939 Alcohol use, unspecified with withdrawal, unspecified: Secondary | ICD-10-CM

## 2021-05-17 DIAGNOSIS — M62838 Other muscle spasm: Secondary | ICD-10-CM

## 2021-05-17 DIAGNOSIS — Z23 Encounter for immunization: Secondary | ICD-10-CM

## 2021-05-17 DIAGNOSIS — K7030 Alcoholic cirrhosis of liver without ascites: Secondary | ICD-10-CM

## 2021-05-17 DIAGNOSIS — N62 Hypertrophy of breast: Secondary | ICD-10-CM | POA: Diagnosis not present

## 2021-05-17 DIAGNOSIS — R9389 Abnormal findings on diagnostic imaging of other specified body structures: Secondary | ICD-10-CM | POA: Diagnosis present

## 2021-05-17 DIAGNOSIS — I10 Essential (primary) hypertension: Secondary | ICD-10-CM | POA: Diagnosis not present

## 2021-05-17 DIAGNOSIS — M79604 Pain in right leg: Secondary | ICD-10-CM | POA: Diagnosis not present

## 2021-05-17 DIAGNOSIS — R0602 Shortness of breath: Secondary | ICD-10-CM | POA: Diagnosis not present

## 2021-05-17 DIAGNOSIS — Z0000 Encounter for general adult medical examination without abnormal findings: Secondary | ICD-10-CM

## 2021-05-17 MED ORDER — OLMESARTAN MEDOXOMIL 20 MG PO TABS: 10.0000 mg | ORAL_TABLET | Freq: Every day | ORAL | 2 refills | Status: DC

## 2021-05-17 NOTE — Patient Instructions (Addendum)
Thank you, Mr.Tayton E Tobi Bastos. for allowing [place] to provide your care today. Today we discussed   Alcohol use Congrats on decreasing amount of alcohol you are consuming.  I will reach out to social work to try to figure out the best facility for your this being outpatient or inpatient services.  Please be on the look out for a call from them.  We will also be reordering an ultrasound of your liver to make sure you have had not progression  Abnormal chest x-ray Ordering a follow-up chest x-ray for you to have.  This will be to reassess from your prior abnormal chest x-ray.  High blood pressure We will be restarting you on blood pressure medication called olmesartan for your blood pressure. I would like you to return to clinic in 1 month for a BP recheck and to see how you are doing with your alcohol use  Leg spasm I am glad to hear that this is resolved.  If you notice that you are having these consistently please call our clinic and we can run a test to check your electrolytes.  Your potassium has been low in the past.  We will be checking a few labs today to make sure magnesium and potassium levels are not low  Shortness of breath I am glad that your shortness of breath has resolved.  With the other people in the house being sick it does sound like you had some type of viral infection.  Glad to know you are doing better.  If any of the symptoms return please call our clinic and let us know and we will evaluate you at that time.  I have ordered the following labs for you:  Lab Orders         BMP8+Anion Gap         Magnesium      Referrals ordered today:   Referral Orders  No referral(s) requested today     I have ordered the following medication/changed the following medications:   Stop the following medications: There are no discontinued medications.   Start the following medications: Meds ordered this encounter  Medications   olmesartan (BENICAR) 20 MG tablet    Sig: Take 0.5  tablets (10 mg total) by mouth daily.    Dispense:  30 tablet    Refill:  2      Follow up:  1 month BP and alcohol use checkup      Should you have any questions or concerns please call the internal medicine clinic at [phone].    Sanjuana Letters, D.O. [place]
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
## 2021-05-17 NOTE — Assessment & Plan Note (Addendum)
Assessment: Prior to his increasing alcohol use, patient was on lisinopril and doing well. During one of his recent admissions patient was hypotensive secondary to decreased PO intake. Since he has decreased his alcohol intake significantly and has been drinking more water/gatorade.   BP readings during this visit elevated, will switch patient from lisinopril to olmesartan. Will order BMP and have patient follow up in 1 month.   Plan: -start olmesartan 20 mg daily -BMP pending -f/u one month

## 2021-05-18 DIAGNOSIS — R0602 Shortness of breath: Secondary | ICD-10-CM

## 2021-05-18 DIAGNOSIS — R0609 Other forms of dyspnea: Secondary | ICD-10-CM | POA: Insufficient documentation

## 2021-05-18 DIAGNOSIS — K7030 Alcoholic cirrhosis of liver without ascites: Secondary | ICD-10-CM | POA: Insufficient documentation

## 2021-05-18 HISTORY — DX: Shortness of breath: R06.02

## 2021-05-18 LAB — BMP8+ANION GAP
Anion Gap: 14 mmol/L (ref 10.0–18.0)
BUN/Creatinine Ratio: 11 (ref 9–20)
BUN: 8 mg/dL (ref 6–24)
CO2: 25 mmol/L (ref 20–29)
Calcium: 9.4 mg/dL (ref 8.7–10.2)
Chloride: 101 mmol/L (ref 96–106)
Creatinine, Ser: 0.72 mg/dL — ABNORMAL LOW (ref 0.76–1.27)
Glucose: 114 mg/dL — ABNORMAL HIGH (ref 70–99)
Potassium: 3.7 mmol/L (ref 3.5–5.2)
Sodium: 140 mmol/L (ref 134–144)
eGFR: 107 mL/min/{1.73_m2} (ref >59)

## 2021-05-18 LAB — MAGNESIUM: Magnesium: 2.1 mg/dL (ref 1.6–2.3)

## 2021-05-18 NOTE — Assessment & Plan Note (Signed)
Assessment: Patient states he had two weeks of exertional dyspnea that occurred last month. He had no other associated symptoms of congestion, chest pain, cough. He endorsed others in his home being sick and one family member testing positive for the flu. Since then, he has not had recurrence of episode. Likely secondary to a viral infection. Low suspicion for cardiac etiology. If it occurs again will initiate further workup.   Plan: -continue to monitor -if reoccurs, determine if needs echocardiogram (hx of alcohol use) EKG, and further evaluation.

## 2021-05-18 NOTE — Assessment & Plan Note (Signed)
Assessment: Patient endorses bilateral gynecomastia. I suspect this is secondary to history of cirrhosis. He does not use marijuana as frequently anymore and do not suspect any of his medications are causing the gynecomastia  Plan: -continue to monitor

## 2021-05-18 NOTE — Assessment & Plan Note (Addendum)
Patient continues to endorse bilateral lower extremity cramps every few months. I suspect these are secondary to electrolyte derangements with his history of alcohol use and lack of PO intake during his binges. Will assess BMP today, starting on olmesartan which will increase potassium levels. Will also check magnesium  Addendum:  Mag and K levels unremarkable. Continue to monitor

## 2021-05-18 NOTE — Assessment & Plan Note (Signed)
Assessment: Abnormal chest xray with opacity seen on prior admission. Patient was instructed to have repeat done and order was placed in august however xray never done. Will repeat order for imaging today.   Plan: -reorder repeat chest xray to evaluate for resolution of RLL opacity.

## 2021-05-18 NOTE — Assessment & Plan Note (Signed)
Assessment: Patient with hospitalization in May 2022 and during patient presented with liver changes in [place] as well as abdominal ascites, consistent with cirrhosis. Patient is not in a decompensatory state, will repeat RUQ [place] to make certain no progression has occurred. Will continue to monitor with yearly lab work.  Plan: -RUQ [place] pending -continue to monitor for signs of progression to decompensated state -continue to work with patient on alcohol use disorder.

## 2021-05-18 NOTE — Assessment & Plan Note (Signed)
Assessment: Initial plan was to have patient taper using outpatient method with gabapentin in 08/22. The following weeks I had a difficult time reaching the patient via phone. Eventually was able to talk with him and he endorsed going back to drinking liquor multiple times a day  Today he notes that he has not had liquor in 3 months and he drinks a 40oz beer every 2-3 days to help with his agitation that occurs. He denies any other signs of withdrawal such as tremors, n/v/d, altered mental status, or seizure like activity. Congratulated him on this and encouraged him to reach out if he feels as though he begins drinking more frequently. He endorses wanting to stop alcohol use all together as his fiance will not marry him until he stops.   He is enquiring about outpatient/inpatient treatment centers. He is not interested in inpatient centers at this time as he would like to be around for the holidays. I will reach out to our social work team to see what resources are available.   Plan: -Continue to work with patient on his alcohol use disorder -Reach out to social work to discuss possible options

## 2021-05-18 NOTE — Assessment & Plan Note (Addendum)
Influenza vaccine given today

## 2021-05-19 ENCOUNTER — Other Ambulatory Visit: Payer: Self-pay

## 2021-05-19 NOTE — Patient Outreach (Signed)
Medicaid Managed Care Social Work Note  05/19/2021 Name:  [name]. MRN:  [id number] DOB:  12/02/1965  [name]. is an 55 y.o. year old male who is a primary patient of Dylan Pope, MD.  The Medicaid Managed Care Coordination team was consulted for assistance with:   detox programs  [name] was given information about Medicaid Managed Care Coordination team services today. [name]. Patient agreed to services and verbal consent obtained.  Engaged with patient  for by telephone forfollow up visit in response to referral for case management and/or care coordination services.   Assessments/Interventions:  Review of past medical history, allergies, medications, health status, including review of consultants reports, laboratory and other test data, was performed as part of comprehensive evaluation and provision of chronic care management services.  SDOH: (Social Determinant of Health) assessments and interventions performed: BSW contacted patient to follow up on resources provided for detox programs. Patient stated he had them but did not call yet because his family wanted him to be around for the holidays. Patient stated after Christmas he would go into one of the programs. Patient stated he has not had a drink, and he he purchased sodas to drink on Thanksgiving rather than alcohol.    Advanced Directives Status:  Not addressed in this encounter.  Care Plan                 No Known Allergies  Medications Reviewed Today     Reviewed by Dylan Pope, MD (Resident) on 05/17/21 at 1502  Med List Status: <None>   Medication Order Taking? Sig Documenting Provider Last Dose Status Informant  aspirin-acetaminophen-caffeine (EXCEDRIN MIGRAINE) 250-250-65 MG tablet [id number] No Take 1 tablet by mouth every 6 (six) hours as needed for headache or migraine.  Patient not taking: No sig reported   Dylan Mercury, MD Not Taking Active            Med  Note [name], [name] Mar 08, 2021  3:47 PM) Patient says he has never taken Excedrin Migraine  atorvastatin (LIPITOR) 40 MG tablet [id number] No TAKE ONE TABLET BY MOUTH EVERY MORNING [name], [name], MD Taking Active   Blood Pressure Monitoring (BLOOD PRESSURE CUFF) [place] [id number]  Please check blood pressure twice a week and record. Take BP in calmed, relaxed setting. Both feet on the ground. Arm at level of chest. Dylan Pope, MD  Active   cyclobenzaprine (FLEXERIL) 5 MG tablet [id number] No Take 1 tablet (5 mg total) by mouth 3 (three) times daily as needed for muscle spasms. Dylan Pope, MD Taking Active   DULoxetine (CYMBALTA) 60 MG capsule [id number] No Take 1 capsule (60 mg total) by mouth daily. [name], MD Taking Active            Med Note [name], [name] Mar 08, 2021  3:48 PM) Says he plans to start taking but just received home delivery of the med today 03/08/21  ergocalciferol (VITAMIN D2) 1.25 MG (50000 UT) capsule [id number] No Take 1 capsule (50,000 Units total) by mouth once a week. [name], MD Taking Active   gabapentin (NEURONTIN) 300 MG capsule [id number] No Take 2 capsules (600 mg total) by mouth 3 (three) times daily. Dylan Pope, MD Taking Active            Med Note [name] Mar 08, 2021  3:48 PM) Says he plans to start  taking but just received home delivery of the med today 03/08/21  Multiple Vitamin (MULTIVITAMIN ADULT PO) [id number] No Take 2 tablets by mouth daily. [provider] Taking Active   tetrahydrozoline-zinc (VISINE-AC) 0.05-0.25 % ophthalmic solution [id number] No Place 1 drop into both eyes 3 (three) times daily as needed (dry eyes). [provider] Taking Active   thiamine 100 MG tablet [id number] No Take 1 tablet (100 mg total) by mouth daily.  Patient not taking: No sig reported   [name], MD Not Taking Active             Patient Active Problem List   Diagnosis Date  Noted   Alcoholic cirrhosis ([place]) 62/83/1517   Shortness of breath 05/18/2021   Healthcare maintenance 01/18/2021   Abnormal chest x-ray 01/17/2021   Gynecomastia 01/17/2021   Heroin use disorder, mild, in early remission, on maintenance therapy, abuse ([place]) 01/17/2021   Alcohol withdrawal syndrome ([place]) 11/08/2020   Aspiration pneumonia ([place]) 11/08/2020   Hyperbilirubinemia 11/08/2020   Tachycardia 10/06/2020   Syncope 09/27/2020   Vitamin D deficiency 07/06/2020   Right hip pain 07/02/2020   Alcohol use disorder, mild, abuse 02/13/2020   Elevated serum GGT level 11/06/2019   Paresthesia 05/15/2019   Pain in both lower extremities 05/15/2019   Functional incontinence 04/14/2019   Gait abnormality 04/14/2019   Myositis 02/06/2019   Anemia 03/14/2018   Left leg pain 01/21/2017   Essential hypertension 01/21/2017   MDD (major depressive disorder) 03/25/2010   ERECTILE DYSFUNCTION 05/14/2008   GERD 04/13/2008   TOBACCO ABUSE 03/11/2008    Conditions to be addressed/monitored per PCP order:   detox programs  Care Plan : General Social Work (Adult)  Updates made by Ethelda Chick since 05/19/2021 12:00 AM     Problem: Detox Programs   Onset Date: 05/10/2021  Note:   [name]. is a 55 y.o. year old male who sees Dylan Pope, MD for primary care. The  [facility] Managed Care team was consulted for assistance with  detox programs . The Patient                                              was given information about Care Management services, agreed to services, and verbal consent for services was obtained.  Interventions:  Patient                                              interviewed and appropriate assessments performed Collaborated with clinical team regarding patient needs  SDOH (Social Determinants of Health) assessments performed: Yes    Provided patient with information about detox programs in [place]. BSW provided patient with the telephone  number for [facility] and [place]. 05/19/21: BSW contacted patient to follow up on resources provided for detox programs. Patient stated he had them but did not call yet because his family wanted him to be around for the holidays. Patient stated after Christmas he would go into one of the programs. Patient stated he has not had a drink, and he he purchased sodas to drink on Thanksgiving rather than alcohol.   Plan:  Patient  will work with BSW to address needs related to  getting into an inpatient detox program Social Worker will continue to work with patient.[name], BSW, [place] Managed Medicaid Team  [phone]          Follow up:  Patient agrees to Care Plan and Follow-up.  Plan: The Managed Medicaid care management team will reach out to the patient again over the next 45 days.  Date/time of next scheduled Social Work care management/care coordination outreach:  07/08/21  [name], Arita Miss, [place] Medicaid Team  [phone]

## 2021-05-19 NOTE — Patient Instructions (Signed)
Visit Information  [name] was given information about Medicaid Managed Care team care coordination services as a part of their [place] Medicaid benefit. [name]. verbally consented to engagement with the [facility] Managed Care team.   If you are experiencing a medical emergency, please call 911 or report to your local emergency department or urgent care.   If you have a non-emergency medical problem during routine business hours, please contact your provider's office and ask to speak with a nurse.   For questions related to your [facility], please call: [phone] or visit the homepage here: [email or web address]  If you would like to schedule transportation through your [facility], please call the following number at least 2 days in advance of your appointment: [phone].   Call the [place] at [phone], at any time, 24 hours a day, 7 days a week. If you are in danger or need immediate medical attention call 911.  If you would like help to quit smoking, call 1-800-QUIT-NOW 573-376-9974) OR Espaol: 1-855-Djelo-Ya (5-456-256-3893) o para ms informacin haga clic aqu or Text READY to 200-400 to register via text  [name] - following are the goals we discussed in your visit today:   Goals Addressed   None     Social Worker will follow up in 45 days.   [name], BSW, [place] Managed Medicaid Team  [phone]   Following is a copy of your plan of care:  Care Plan : General Social Work (Adult)  Updates made by Ethelda Chick since 05/19/2021 12:00 AM     Problem: Detox Programs   Onset Date: 05/10/2021  Note:   [name]. is a 55 y.o. year old male who sees [name], MD for primary care. The  [facility] Managed Care team was  consulted for assistance with  detox programs . The Patient                                              was given information about Care Management services, agreed to services, and verbal consent for services was obtained.  Interventions:  Patient                                              interviewed and appropriate assessments performed Collaborated with clinical team regarding patient needs  SDOH (Social Determinants of Health) assessments performed: Yes    Provided patient with information about detox programs in [place]. BSW provided patient with the telephone number for [facility] and [place]. 05/19/21: BSW contacted patient to follow up on resources provided for detox programs. Patient stated he had them but did not call yet because his family wanted him to be around for the holidays. Patient stated after Christmas he would go into one of the programs. Patient stated he has not had a drink, and he he purchased sodas to drink on Thanksgiving rather than alcohol.   Plan:  Patient  will work with BSW to address needs related to  getting into an inpatient detox program Social Worker will continue to work with patient.[name], BSW, [place] Managed Medicaid Team  [phone]

## 2021-05-20 NOTE — Progress Notes (Signed)
Internal Medicine Clinic Attending  Case discussed with Dr. [name]  At the time of the visit.  We reviewed the resident's history and exam and pertinent patient test results.  I agree with the assessment, diagnosis, and plan of care documented in the resident's note.  

## 2021-06-03 ENCOUNTER — Other Ambulatory Visit: Payer: Self-pay

## 2021-06-09 NOTE — Patient Outreach (Addendum)
Medicaid Managed Care Pharmacy Note  06/03/2021 Name:  [name]. MRN:  [id number] DOB:  1965-11-27  [name]. is an 55 y.o. year old male who is a primary patient of Dylan Pope, MD.  The [facility] Managed Care Coordination team was consulted for assistance with disease management and care coordination needs.    Engaged with patient by telephone for follow up visit in response to referral for case management and/or care coordination services.  [name] was given information about Medicaid Managed Care Coordination team services today. [name]. Patient agreed to services and verbal consent obtained.  Objective:  Lab Results  Component Value Date   CREATININE 0.72 (L) 05/17/2021   CREATININE 0.54 (L) 11/17/2020   CREATININE 0.52 (L) 11/13/2020    Lab Results  Component Value Date   HGBA1C 5.3 09/27/2020       Component Value Date/Time   CHOL 187 09/27/2020 0337   CHOL 155 01/19/2017 1600   TRIG 131 09/27/2020 0337   HDL 36 (L) 09/27/2020 0337   HDL 62 01/19/2017 1600   CHOLHDL 5.2 09/27/2020 0337   VLDL 26 09/27/2020 0337   LDLCALC 125 (H) 09/27/2020 0337   LDLCALC 68 01/19/2017 1600   BP Readings from Last 3 Encounters:  05/17/21 (!) 148/82  02/03/21 130/86  01/17/21 140/85    Care Plan  No Known Allergies  Medications Reviewed Today     Reviewed by Hughes Better, RPH-CPP (Pharmacist) on 06/09/21 at 65  Med List Status: <None>   Medication Order Taking? Sig Documenting Provider Last Dose Status Informant  aspirin-acetaminophen-caffeine (EXCEDRIN MIGRAINE) 250-250-65 MG tablet [id number] No Take 1 tablet by mouth every 6 (six) hours as needed for headache or migraine.  Patient not taking: No sig reported   [name], MD Not Taking Active            Med Note [name], [name] Mar 08, 2021  3:47 PM) Patient says he has never taken Excedrin Migraine  atorvastatin (LIPITOR) 40 MG tablet [id number] No TAKE ONE  TABLET BY MOUTH EVERY MORNING [name], [name], MD Taking Active   Blood Pressure Monitoring (BLOOD PRESSURE CUFF) [place] [id number]  Please check blood pressure twice a week and record. Take BP in calmed, relaxed setting. Both feet on the ground. Arm at level of chest. Dylan Pope, MD  Active   cyclobenzaprine (FLEXERIL) 5 MG tablet [id number] No Take 1 tablet (5 mg total) by mouth 3 (three) times daily as needed for muscle spasms. Dylan Pope, MD Taking Active   DULoxetine (CYMBALTA) 60 MG capsule [id number] No Take 1 capsule (60 mg total) by mouth daily. [name], MD Taking Active            Med Note [name], [name] Mar 08, 2021  3:48 PM) Says he plans to start taking but just received home delivery of the med today 03/08/21  ergocalciferol (VITAMIN D2) 1.25 MG (50000 UT) capsule [id number] No Take 1 capsule (50,000 Units total) by mouth once a week. [name], MD Taking Active   gabapentin (NEURONTIN) 300 MG capsule [id number] No Take 2 capsules (600 mg total) by mouth 3 (three) times daily. Dylan Pope, MD Taking Active            Med Note [name], [name] Mar 08, 2021  3:48 PM) Says he plans to start taking but just received home delivery of the med today 03/08/21  Multiple Vitamin (MULTIVITAMIN ADULT PO) [id number] No Take 2 tablets by mouth daily. [provider] Taking Active   olmesartan (BENICAR) 20 MG tablet [id number]  Take 0.5 tablets (10 mg total) by mouth daily. Dylan Pope, MD  Active   tetrahydrozoline-zinc (VISINE-AC) 0.05-0.25 % ophthalmic solution [id number] No Place 1 drop into both eyes 3 (three) times daily as needed (dry eyes). [provider] Taking Active   thiamine 100 MG tablet [id number] No Take 1 tablet (100 mg total) by mouth daily.  Patient not taking: No sig reported   [name], MD Not Taking Active             Patient Active Problem List   Diagnosis Date Noted   Alcoholic cirrhosis  ([place]) 05/18/2021   Shortness of breath 05/18/2021   Healthcare maintenance 01/18/2021   Abnormal chest x-ray 01/17/2021   Gynecomastia 01/17/2021   Heroin use disorder, mild, in early remission, on maintenance therapy, abuse ([name]) 01/17/2021   Alcohol withdrawal syndrome ([name]) 11/08/2020   Aspiration pneumonia ([name]) 11/08/2020   Hyperbilirubinemia 11/08/2020   Tachycardia 10/06/2020   Syncope 09/27/2020   Vitamin D deficiency 07/06/2020   Right hip pain 07/02/2020   Alcohol use disorder, mild, abuse 02/13/2020   Elevated serum GGT level 11/06/2019   Paresthesia 05/15/2019   Pain in both lower extremities 05/15/2019   Functional incontinence 04/14/2019   Gait abnormality 04/14/2019   Myositis 02/06/2019   Anemia 03/14/2018   Left leg pain 01/21/2017   Essential hypertension 01/21/2017   MDD (major depressive disorder) 03/25/2010   ERECTILE DYSFUNCTION 05/14/2008   GERD 04/13/2008   TOBACCO ABUSE 03/11/2008    Conditions to be addressed/monitored per PCP order:  HTN, Anxiety, Depression, Tobacco Use, and Alcohol withdrawal  Care Plan : General Pharmacy (Adult)  Updates made by Hughes Better, RPH-CPP since 06/09/2021 12:00 AM     Problem: Chronic Disease Management   Priority: High     Long-Range Goal: Managing Chronic Disease Therapies   Start Date: 05/06/2021  Expected End Date: 08/04/2021  Recent Progress: On track  Priority: High  Note:   Current Barriers:  Unable to maintain control of depression, tobacco, and alcohol abuse Does not adhere to prescribed medication regimen Does not maintain contact with provider office   Pharmacist Clinical Goal(s):  patient will achieve improvement in depression as evidenced by improvement in PHQ-9 score patient will achieve improvement in smoking cigarettes as evidenced by decrease in number of PPD patient will achieve improvement in alcohol abuse as evidenced by decrease in consumption of alcoholic beverages adhere to  prescribed medication regimen as evidenced by timely refill of medications contact provider office for questions/concerns as evidenced notation of same in electronic health record through collaboration with PharmD and provider.    Interventions: Inter-disciplinary care team collaboration (see longitudinal plan of care) Comprehensive medication review performed; medication list updated in electronic medical record  Hypertension:  Controlled; current treatment: not on any therapy   Current home readings: 120's/80's (unable to provide specific numbers)  Denies hypotensive/hypertensive symptoms  Recommended patient continue to check blood pressure at home  Depression/Anxiety:  Uncontrolled; current treatment: duloxetine 60mg ;   PHQ9: 24 (05/06/21)       Patient states he does have suicidial thoughts from time to time but ultimately does not want to end his life because he loves his grandchildren. He reports  suicidal attempt >1 year ago where he "took whatever pills my friend had at his house" but does not actively have a  plan or any intent. His 46 yo granddaughter is coming over tomorrow which patient is looking forward to as well as he upcoming holidays.       Patient states he gets angry and is concerned he will lose family/friends due to this. He reports saying things to people out of anger and hurting people a lot        Patient previously seeing Dr. [name] but appears to have been lost to follow-up.  Previously provided patient suicide/mental health crisis number  Counseled on importance of taking medications that are prescribed regularly, especially antidepressant/antianxiety medications as these need to be taken  regularly for improvement in symptoms       Recommended patient call suicide/mental health crisis number or present to ED if he feels he needs immediate help surrounding his depression       Discussed with patient he did not answer call attempts from Dr. [name] to re-establish  contact. Recommended patient reach out to [facility] clinic to make  appointment  Tobacco Abuse:  1 packs per day; does within 30 minutes of waking up  Triggers to smoke: friends who also smoke  Motivation to quit smoking: his health and family  On a scale of 1-10, how IMPORTANT is it for you to quit smoking: 10  On a scale of 1-10, how CONFIDENT are you that you can quit smoking: 3       Patient reports an increase in SOB recently which he attributes to smoking cigarettes  Recommended patient call 1-800 QUIT NOW to obtain free NRT which he has not done per previous discussion  Alcohol abuse:       Patient previously quit drinking but over the past several months has resumed       60 beers/week       Patient states "I want to go to an inpatient detox program"       Managed Medicaid team had provided resources for patient and per their discussion patient wanted to wait to go to detox program after new year. Will  follow-up with patient after new year to discuss progress  Patient Goals/Self-Care Activities patient will:  - focus on medication adherence by setting alarms - check blood pressure 2-3x/week, document, and provide at future appointments - reach out to PCP office to schedule follow-up  Follow Up Plan:  Telephone follow up appointment with care management team member scheduled for: 07/08/21 Social Worker; 07/12/21 Pharmacist Will contact PCP to inform him patient did not schedule follow-up appt        Hughes Better PharmD, CPP High Risk Managed Medicaid [place] [phone]

## 2021-06-09 NOTE — Patient Instructions (Signed)
Visit Information  [name] was given information about Medicaid Managed Care team care coordination services as a part of their [place] Medicaid benefit. [facility]. verbally consented to engagement with the [facility] Managed Care team.   If you are experiencing a medical emergency, please call 911 or report to your local emergency department or urgent care.   If you have a non-emergency medical problem during routine business hours, please contact your provider's office and ask to speak with a nurse.   For questions related to your [facility], please call: [phone] or visit the homepage here: [email or web address]  If you would like to schedule transportation through your [facility], please call the following number at least 2 days in advance of your appointment: [phone].   Call the [facility] at [phone], at any time, 24 hours a day, 7 days a week. If you are in danger or need immediate medical attention call 911.  If you would like help to quit smoking, call 1-800-QUIT-NOW 214-040-1349) OR Espaol: 1-855-Djelo-Ya (7-741-287-8676) o para ms informacin haga clic aqu or Text READY to 200-400 to register via text  [name] - following are the goals we discussed in your visit today:   Goals Addressed   None     The patient verbalized understanding of instructions provided today and declined a print copy of patient instruction materials.   Social Worker will call on 07/08/21.  Pharmacist will 07/12/21 , Hughes Better PharmD, CPP High Risk Managed Medicaid [place] [phone]   Following is a copy of your plan of care:  Care Plan : General Pharmacy (Adult)  Updates made by Hughes Better, RPH-CPP since 06/09/2021 12:00 AM     Problem: Chronic Disease Management   Priority: High     Long-Range Goal:  Managing Chronic Disease Therapies   Start Date: 05/06/2021  Expected End Date: 08/04/2021  Recent Progress: On track  Priority: High  Note:   Current Barriers:  Unable to maintain control of depression, tobacco, and alcohol abuse Does not adhere to prescribed medication regimen Does not maintain contact with provider office   Pharmacist Clinical Goal(s):  patient will achieve improvement in depression as evidenced by improvement in PHQ-9 score patient will achieve improvement in smoking cigarettes as evidenced by decrease in number of PPD patient will achieve improvement in alcohol abuse as evidenced by decrease in consumption of alcoholic beverages adhere to prescribed medication regimen as evidenced by timely refill of medications contact provider office for questions/concerns as evidenced notation of same in electronic health record through collaboration with PharmD and provider.    Interventions: Inter-disciplinary care team collaboration (see longitudinal plan of care) Comprehensive medication review performed; medication list updated in electronic medical record  Hypertension:  Controlled; current treatment: not on any therapy   Current home readings: 120's/80's (unable to provide specific numbers)  Denies hypotensive/hypertensive symptoms  Recommended patient continue to check blood pressure at home  Depression/Anxiety:  Uncontrolled; current treatment: duloxetine 60mg ;   PHQ9: 24 (05/06/21)       Patient states he does have suicidial thoughts from time to time but ultimately does not want to end his life because he loves his grandchildren. He reports  suicidal attempt >1 year ago where he "took whatever pills my friend had at his house" but does not actively have a plan or any intent. His 38 yo granddaughter is coming over tomorrow which patient is looking forward  to as well as he upcoming holidays.       Patient states he gets angry and is concerned he will lose family/friends  due to this. He reports saying things to people out of anger and hurting people a lot        Patient previously seeing Dr. [name] but appears to have been lost to follow-up.  Previously provided patient suicide/mental health crisis number  Counseled on importance of taking medications that are prescribed regularly, especially antidepressant/antianxiety medications as these need to be taken  regularly for improvement in symptoms       Recommended patient call suicide/mental health crisis number or present to ED if he feels he needs immediate help surrounding his depression       Discussed with patient he did not answer call attempts from Dr. [name] to re-establish contact. Recommended patient reach out to [facility] clinic to make  appointment  Tobacco Abuse:  1 packs per day; does within 30 minutes of waking up  Triggers to smoke: friends who also smoke  Motivation to quit smoking: his health and family  On a scale of 1-10, how IMPORTANT is it for you to quit smoking: 10  On a scale of 1-10, how CONFIDENT are you that you can quit smoking: 3       Patient reports an increase in SOB recently which he attributes to smoking cigarettes  Recommended patient call 1-800 QUIT NOW to obtain free NRT which he has not done per previous discussion  Alcohol abuse:       Patient previously quit drinking but over the past several months has resumed       60 beers/week       Patient states "I want to go to an inpatient detox program"       Managed Medicaid team had provided resources for patient and per their discussion patient wanted to wait to go to detox program after new year. Will  follow-up with patient after new year to discuss progress  Patient Goals/Self-Care Activities patient will:  - focus on medication adherence by setting alarms - check blood pressure 2-3x/week, document, and provide at future appointments - reach out to PCP office to schedule follow-up  Follow Up Plan:  Telephone follow  up appointment with care management team member scheduled for: 07/08/21 Social Worker; 07/12/21 Pharmacist Will contact PCP to inform him patient did not schedule follow-up appt

## 2021-06-10 ENCOUNTER — Ambulatory Visit (HOSPITAL_COMMUNITY)
Admission: EM | Admit: 2021-06-10 | Discharge: 2021-06-10 | Disposition: A | Discharge status: Home / Self Care | Payer: Medicaid Other | Attending: Psychiatry | Admitting: Psychiatry

## 2021-06-10 DIAGNOSIS — Z560 Unemployment, unspecified: Secondary | ICD-10-CM | POA: Insufficient documentation

## 2021-06-10 DIAGNOSIS — F1290 Cannabis use, unspecified, uncomplicated: Secondary | ICD-10-CM | POA: Insufficient documentation

## 2021-06-10 DIAGNOSIS — R296 Repeated falls: Secondary | ICD-10-CM | POA: Insufficient documentation

## 2021-06-10 DIAGNOSIS — F1020 Alcohol dependence, uncomplicated: Secondary | ICD-10-CM | POA: Insufficient documentation

## 2021-06-10 DIAGNOSIS — F32A Depression, unspecified: Secondary | ICD-10-CM | POA: Insufficient documentation

## 2021-06-10 DIAGNOSIS — Z9151 Personal history of suicidal behavior: Secondary | ICD-10-CM | POA: Insufficient documentation

## 2021-06-10 NOTE — ED Provider Notes (Signed)
Behavioral Health Urgent Care Medical Screening Exam  Patient Name: [name]. MRN: [id number] Date of Evaluation: 06/10/21 Chief Complaint:   Diagnosis:  Final diagnoses:  Uncomplicated alcohol dependence ([place])    History of Present illness: [name]. is a 55 y.o. male patient who presents to the [facility] voluntarily as a walk-in accompanied by his friend Debbe Bales with a chief complaint of "alcohol detox"  Patient seen and evaluated face-to-face by this provider with his friend Leane Para present, chart reviewed and case discussed with Dr. Serafina Mitchell. On evaluation, patient is alert and oriented x4. His thought process is logical and he is goal oriented for treatment. His speech is clear and coherent. His mood is euthymic and affect is congruent.  Patient reports that he needs help to stop drinking alcohol.  He reports that he has been drinking a combination of beer and liquor every day, on average 8-10 beers. He reports that his last drink was yesterday and states that he had 5 beers. He denies alcohol withdrawal symptoms at this time. He reports that he has been drinking alcohol since age 78. He reports the longest he has been without alcohol was this past summer for 2 weeks. He denied any alcohol withdrawal symptoms at that time and states that the most he experienced was irritability. He denies a history of alcohol withdrawal seizures or DTs. He denies a history of substance abuse treatment. He reports smoking a joint a day.  He reports smoking marijuana since age 50. He denies using other illicit drugs.   Patient denies current suicidal ideations. Patient denies current homicidal ideations. Patient denies auditory or visual hallucinations. There is no evidence that the patient is responding to internal or external stimuli.  Patient reports that he is prescribed duloxetine for his depression. He states that he is not compliant with taking his  medications and takes his medications occasionally. He reports that the duloxetine is prescribed by his PCP at [facility]. He reports a past history of 2 suicide attempts years ago. He reports 2 previous psychiatric hospitalizations years ago. He reports that he ambulates with a cane and that he wears 5 braces on his left leg and 2 braces on his right leg. He reports that he has been falling more frequently. He reports that his last fall was on Wednesday. He denies other medical problems such as hypertension, seizures, and diabetes. He reports that he resides with his fiance and her 52 year old son. He reports that he is unemployed and is on disability.  Patient was informed that with his history of frequent falls and need to ambulate with a cane may be a barrier for inpatient substance abuse treatment. Patient was advised to consider outpatient substance abuse treatment as an option. This provider discussed the patient's case with Doris's clinical social worker for substance abuse inpatient treatment referrals and outpatient resources. Patient was accepted to two inpatient detox facilities in [place] and [place]. Patient declined treatment offers due to distance. Patient provided with outpatient resources for substance abuse treatment and psychiatry.   Psychiatric Specialty Exam  Presentation  General Appearance:Appropriate for Environment  Eye Contact:Fair  Speech:Clear and Coherent  Speech Volume:Normal  Handedness:Right   Mood and Affect  Mood:Euthymic  Affect:Appropriate   Thought Process  Thought Processes:Coherent; Goal Directed  Descriptions of Associations:Intact  Orientation:Full (Time, Place and Person)  Thought Content:WDL  Diagnosis of Schizophrenia or Schizoaffective disorder in past: No   Hallucinations:None  Ideas of  Reference:None  Suicidal Thoughts:No  Homicidal Thoughts:No   Sensorium  Memory:Immediate Fair; Recent Fair; Remote  Fair  Judgment:Fair  Insight:Fair   Executive Functions  Concentration:Fair  Attention Span:Fair  [place]   Psychomotor Activity  Psychomotor Activity:Normal   Assets  Assets:Communication Skills; Desire for Improvement; Financial Resources/Insurance; Housing; Intimacy; Leisure Time; Social Support   Sleep  Sleep:Fair  Number of hours: 8   No data recorded  Physical Exam: Physical Exam Constitutional:      Appearance: Normal appearance.  HENT:     Head: Normocephalic and atraumatic.     Nose: Nose normal.  Eyes:     Conjunctiva/sclera: Conjunctivae normal.  Cardiovascular:     Rate and Rhythm: Tachycardia present.  Pulmonary:     Effort: Pulmonary effort is normal.  Musculoskeletal:     Cervical back: Normal range of motion.     Comments: Ambulates with a cane  Neurological:     Mental Status: He is alert and oriented to person, place, and time.   Review of Systems  Constitutional: Negative.   HENT: Negative.    Eyes: Negative.   Respiratory: Negative.    Cardiovascular: Negative.   Gastrointestinal: Negative.   Genitourinary: Negative.   Musculoskeletal:  Positive for falls.       Shot in left leg.   Skin: Negative.   Neurological: Negative.   Endo/Heme/Allergies: Negative.   Psychiatric/Behavioral:  Positive for substance abuse.   Blood pressure (!) 151/95, pulse (!) 106, temperature 98.3 F (36.8 C), temperature source Oral, resp. rate 18, SpO2 100 %. There is no height or weight on file to calculate BMI.  Musculoskeletal: Strength & Muscle Tone: within normal limits Gait & Station: normal Patient leans: N/A   [place] MSE Discharge Disposition for Follow up and Recommendations: Based on my evaluation the patient does not appear to have an emergency medical condition and can be discharged with resources and follow up care in outpatient services for Medication Management, Substance Abuse Intensive  Outpatient Program, Individual Therapy, and Group Therapy   Marissa Calamity, NP 06/10/2021, 10:57 AM

## 2021-06-10 NOTE — Progress Notes (Signed)
CSW asked to locate inpatient detox. CSW able to locate 2 facilities that would accept, located [place] and [place]. Pt reported facilities were too far away and opted for outpatient resources instead. [facility] provider made aware and will place resources in AVS. Pt to discharge.  Shaheen Star, LCSWA 11:52 am

## 2021-06-10 NOTE — Progress Notes (Signed)
Patient is a 55 year old male that presents voluntary to [facility] requesting assistance with ongoing ETOH issues. Patient is denying any S/I, H/I or AVH. Patient has intensive case management (see notes) and was last seen by that provider on 12/2 which outlined patient's goals. Patient reports his primary concerns associated with his non-compliance is his continued alcohol use. Patient reports consuming up to 10 beers daily along with various amounts of alcohol. This writer spoke at length to patient in reference to his non-compliance in reference to not taking mediations and his inability to reduce his alcohol intake. This Probation officer suggested a higher level of care to include possible a rehab facility although patient states he "doesn't want to go out of town." Patient states he has never followed up with any OP groups although may be interested. Patient is here requesting a "detox program" but is open to the above interventions.

## 2021-06-10 NOTE — ED Notes (Signed)
Pt discharged with  AVS.  AVS reviewed prior to discharge.  Pt alert, oriented, and ambulatory.  Safety maintained.  °

## 2021-06-10 NOTE — Discharge Instructions (Signed)
Discharge recommendations:  ?Please see information for follow-up appointment with psychiatry and therapy. ?Please follow up with your primary care provider for all medical related needs.  ? ?Therapy: We recommend that patient participate in individual therapy to address mental health concerns. ? ?Medications: The parent/guardian is to contact a medical professional and/or outpatient provider to address any new side effects that develop. Parent/guardian should update outpatient providers of any new medications and/or medication changes.  ? ?Safety:  ?The patient should abstain from use of illicit substances/drugs and abuse of any medications. ?If symptoms worsen or do not continue to improve or if the patient becomes actively suicidal or homicidal then it is recommended that the patient return to the closest hospital emergency department, the [facility], or call 911 for further evaluation and treatment. ?National Suicide Prevention Lifeline 1-800-SUICIDE or 1-800-273-8255. ? ?About 988 ?988 offers 24/7 access to trained crisis counselors who can help people experiencing mental health-related distress. People can call or text 988 or chat 988lifeline.org for themselves or if they are worried about a loved one who may need crisis support.  ? ? ?  ?

## 2021-06-19 ENCOUNTER — Telehealth (HOSPITAL_COMMUNITY): Payer: Self-pay | Admitting: Student

## 2021-06-19 NOTE — BH Assessment (Signed)
Care Management - Follow Up [facility] Discharges   Writer made contact with the patient. Patient reports that he has not had anything to drink.  Patient continues to decline outpatient resources.  Patient reports that he will not be following up with any of the outpatient referrals.

## 2021-06-22 ENCOUNTER — Other Ambulatory Visit: Payer: Self-pay | Admitting: Student

## 2021-06-22 DIAGNOSIS — M79605 Pain in left leg: Secondary | ICD-10-CM

## 2021-07-07 ENCOUNTER — Encounter: Payer: Medicaid Other | Admitting: Student

## 2021-07-08 ENCOUNTER — Other Ambulatory Visit: Payer: Self-pay

## 2021-07-08 NOTE — Patient Instructions (Signed)
Visit Information  Mr. Gerri Spore.  - as a part of your Medicaid benefit, you are eligible for care management and care coordination services at no cost or copay. I was unable to reach you by phone today but would be happy to help you with your health related needs. Please feel free to call me [phone].      [name], BSW, [place] Managed Medicaid Team  [phone]

## 2021-07-08 NOTE — Patient Outreach (Signed)
Care Coordination  07/08/2021  [name] September 18, 1965 [id number]   Medicaid Managed Care   Unsuccessful Outreach Note  07/08/2021 Name: [name]. MRN: [id number] DOB: [birth year]  Referred by: [name], MD Reason for referral : High Risk Managed Medicaid (MM Social [facility] [employer])   An unsuccessful telephone outreach was attempted today. The patient was referred to the case management team for assistance with care management and care coordination.   Follow Up Plan: The patient has been provided with contact information for the care management team and has been advised to call with any health related questions or concerns.   [name], BSW, [place] Managed Medicaid Team  [phone]

## 2021-07-12 ENCOUNTER — Other Ambulatory Visit: Payer: Self-pay

## 2021-07-23 ENCOUNTER — Other Ambulatory Visit: Payer: Self-pay | Admitting: Student

## 2021-07-28 ENCOUNTER — Encounter: Payer: Medicaid Other | Admitting: Student

## 2021-08-23 ENCOUNTER — Other Ambulatory Visit: Payer: Self-pay | Admitting: Student

## 2021-08-23 DIAGNOSIS — M79605 Pain in left leg: Secondary | ICD-10-CM

## 2021-08-30 ENCOUNTER — Ambulatory Visit (HOSPITAL_COMMUNITY)
Admission: RE | Admit: 2021-08-30 | Discharge: 2021-08-30 | Disposition: A | Discharge status: Home / Self Care | Payer: Medicaid Other | Source: Ambulatory Visit | Attending: Internal Medicine | Admitting: Internal Medicine

## 2021-08-30 ENCOUNTER — Other Ambulatory Visit: Payer: Self-pay

## 2021-08-30 DIAGNOSIS — K7030 Alcoholic cirrhosis of liver without ascites: Secondary | ICD-10-CM | POA: Insufficient documentation

## 2021-09-01 ENCOUNTER — Ambulatory Visit (HOSPITAL_COMMUNITY)
Admission: RE | Admit: 2021-09-01 | Discharge: 2021-09-01 | Disposition: A | Discharge status: Home / Self Care | Payer: Medicaid Other | Source: Ambulatory Visit | Attending: Student in an Organized Health Care Education/Training Program | Admitting: Student in an Organized Health Care Education/Training Program

## 2021-09-01 ENCOUNTER — Other Ambulatory Visit: Payer: Self-pay

## 2021-09-01 ENCOUNTER — Ambulatory Visit (INDEPENDENT_AMBULATORY_CARE_PROVIDER_SITE_OTHER): Payer: Medicaid Other | Admitting: Student

## 2021-09-01 VITALS — BP 124/75 | HR 89 | Temp 98.2°F | Ht 67.0 in | Wt 159.5 lb

## 2021-09-01 DIAGNOSIS — R9389 Abnormal findings on diagnostic imaging of other specified body structures: Secondary | ICD-10-CM

## 2021-09-01 DIAGNOSIS — M79605 Pain in left leg: Secondary | ICD-10-CM | POA: Diagnosis not present

## 2021-09-01 DIAGNOSIS — F1010 Alcohol abuse, uncomplicated: Secondary | ICD-10-CM | POA: Diagnosis present

## 2021-09-01 DIAGNOSIS — F17200 Nicotine dependence, unspecified, uncomplicated: Secondary | ICD-10-CM

## 2021-09-01 DIAGNOSIS — R0602 Shortness of breath: Secondary | ICD-10-CM | POA: Diagnosis not present

## 2021-09-01 DIAGNOSIS — M79604 Pain in right leg: Secondary | ICD-10-CM

## 2021-09-01 DIAGNOSIS — N521 Erectile dysfunction due to diseases classified elsewhere: Secondary | ICD-10-CM | POA: Diagnosis not present

## 2021-09-01 DIAGNOSIS — K7030 Alcoholic cirrhosis of liver without ascites: Secondary | ICD-10-CM | POA: Diagnosis not present

## 2021-09-01 DIAGNOSIS — M62838 Other muscle spasm: Secondary | ICD-10-CM | POA: Diagnosis not present

## 2021-09-01 LAB — PROTIME-INR
INR: 1.6 — ABNORMAL HIGH (ref 0.8–1.2)
Prothrombin Time: 19.1 seconds — ABNORMAL HIGH (ref 11.4–15.2)

## 2021-09-01 MED ORDER — SILDENAFIL CITRATE 50 MG PO TABS: 25.0000 mg | ORAL_TABLET | ORAL | 0 refills | Status: DC | PRN

## 2021-09-01 NOTE — Patient Instructions (Signed)
Thank you, [name] for allowing [place] to provide your care today. Today we discussed. ? ?Alcohol use disorder ?We will be checking your labs today and and if no worsening of your liver disease as well as to check your electrolyte levels we will consider starting naltrexone.  This is a medication to help with alcohol cravings.  I will call you in about a week to follow-up on this and see how you are doing.  If you notice that you are having signs or symptoms of alcohol withdrawal which includes tremors nausea vomiting diarrhea, please call our clinic immediately or go to the nearest emergency department. ? ?Erectile dysfunction ?I believe that your difficulties with erections are secondary to your alcohol use.  We will be writing you for Viagra for 15 tabs.  Please take 1 hour before sexual intercourse.  Do not take with nitrates like nitroglycerin or a medication called Imdur. ? ?Abnormal chest x-ray ?Please follow-up and repeat your chest x-ray from a few months ago. ? ?I have ordered the following labs for you: ? ? ?Lab Orders    ?     CMP14 + Anion Gap    ?     Phosphorus    ?     Magnesium    ?     CBC no Diff    ?     Protime-INR     ? ? ?Referrals ordered today:  ? ?Referral Orders  ?No referral(s) requested today  ?  ? ?I have ordered the following medication/changed the following medications:  ? ?Stop the following medications: ?There are no discontinued medications.  ? ?Start the following medications: ?Meds ordered this encounter  ?Medications  ? sildenafil (VIAGRA) 50 MG tablet  ?  Sig: Take 0.5 tablets (25 mg total) by mouth as needed for erectile dysfunction. Take 1 hour before sexual acitivty. If you have an erection after a few hours, please call our clinic or go to the nearest emergency department. Do not take with nitrates (nitroglycerin)  ?  Dispense:  15 tablet  ?  Refill:  0  ?  ? ?Follow up:  3 weeks -4weeks ? ? ? ?Should you have any questions or concerns please call the internal  medicine clinic at [phone].   ? ?[name], D.O. ?[place] ?  ?

## 2021-09-01 NOTE — Assessment & Plan Note (Addendum)
Assessment: ?Patient has history of alcohol use disorder and continues to drink alcohol daily.  His alcohol use has fluctuated over the past year, last time I saw Mr. Narez he was only drinking an occasional beer every couple of days.  He is currently drinking alcohol multiple times per day.  He endorses having 2 beers prior to coming to his appointment today.  The driving factor in his alcohol use is drinking in social settings as well as if he has increased life stressors.  In the past he has met with Dr. Carolynne Edouard and he endorsed that that it helped, he would like to meet with her again sometime soon. ? ?We discussed his most recent right upper quadrant ultrasound that did show hepatic steatosis.  We discussed that if he continues to use alcohol that this will progress and lead to liver failure.  Patient would like to stop drinking and is interested in medications that would help with this.  We will check a CMP, PT/INR today.  He does have some concerning examination findings of abdominal distention and palpable liver margins.  Do not appreciate significant ascites.  ? ?Mr. Wales to reach out to our clinic if he would like to stop and begins having episodes of withdrawals which include tremors, nausea, vomiting.  We have attempted gabapentin taper in the past unfortunately the patient continue to use alcohol.  ? ?Encouraged him to reach out at any time for further assistance with this matter. ?Plan: ?-Follow-up with Dr. Carolynne Edouard for counseling ?-Repeat CMP, PT/INR, mag, Phos ?-Consider starting naltrexone if laboratory data not worsened. ? ?Addendum: Child-pugh score of Child Class A, will call to discuss starting naltrexone.  ?
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
## 2021-09-01 NOTE — Assessment & Plan Note (Signed)
Patient endorses having difficulties achieving and maintaining an erection.  He denies waking with erections.  This is likely secondary to his alcohol use and hepatic disease.  We will start patient on 25 mg sildenafil until he responds to this.  Patients with liver disease can uptitrate to 100 mg max per day. ?

## 2021-09-01 NOTE — Assessment & Plan Note (Addendum)
Repeat ultrasound in last week revealed hepatic steatosis without focal liver lesions.  We will await laboratory data to determine if patient needs repeat ultrasound.   ? ?Physical exam without evidence of jaundice, telangiectasias, gynecomastia or hypervolemia on examination. Of note, did have hepatomegaly and some abdominal distension. Fluid wave absent.  ? ?Addendum:  ?PT/INR elevated, worsening thrombocytopenia. Elevated AST/ALT. T-bili normal.  Difficult to determine if labs are secondary to worsening liver disease or his acute alcohol use. Likely combination of both. Plan to repeat CBC at follow up in the next week or so.. Low suspicion for acute alcoholic hepatitis.  If patient does develop ascites will need to discontinue ARB. Child Class A, with this can conisider starting naltrexone. Will call and discuss with patient.  ? ?MELD score of 12. Consider GI referral if no improvement in labs.  ? ?

## 2021-09-01 NOTE — Assessment & Plan Note (Addendum)
During patient's admission last year he had an abnormal chest x-ray with an opacity suspected to be pneumonia versus nodule.  Repeat chest x-ray performed today.  My interpretation there is resolution of the opacity however will await formal read by radiologist. ? ?Addendum:  ?Per radiology report, right lobe opacity still present, however improved from prior examination. Patient without evidence of infection on examination. Suspect atelectasis.  ? ?Uncertain of pack year history, will follow up with patient to determine number of years he has smoked. If over 20 pack year history, patient would qualify for low dose CT.  ? ?Addnedum: Patient smoked 1 ppd for 38 years. Recently decreased tobacco use to .5 ppd. Patient meets criteria for lung cancer screening, will order low dose ct chest.  ?

## 2021-09-02 ENCOUNTER — Other Ambulatory Visit: Payer: Self-pay | Admitting: Internal Medicine

## 2021-09-02 DIAGNOSIS — K7030 Alcoholic cirrhosis of liver without ascites: Secondary | ICD-10-CM

## 2021-09-02 DIAGNOSIS — D696 Thrombocytopenia, unspecified: Secondary | ICD-10-CM

## 2021-09-02 DIAGNOSIS — I10 Essential (primary) hypertension: Secondary | ICD-10-CM

## 2021-09-02 LAB — CBC
Hematocrit: 38.5 % (ref 37.5–51.0)
Hemoglobin: 13 g/dL (ref 13.0–17.7)
MCH: 33.3 pg — ABNORMAL HIGH (ref 26.6–33.0)
MCHC: 33.8 g/dL (ref 31.5–35.7)
MCV: 99 fL — ABNORMAL HIGH (ref 79–97)
Platelets: 74 10*3/uL — CL (ref 150–450)
RBC: 3.9 x10E6/uL — ABNORMAL LOW (ref 4.14–5.80)
RDW: 14.4 % (ref 11.6–15.4)
WBC: 6.1 10*3/uL (ref 3.4–10.8)

## 2021-09-02 NOTE — Assessment & Plan Note (Signed)
Assessment: ?[name] continues to have dyspnea on exertion. He denies angina symptoms when these episodes occur. He is euvolemic on exam today. Likely secondary to his alcohol use and believe he would have improvement with alcohol cessation.  ? ?Plan: ?-continue to monitor for further progression ?

## 2021-09-02 NOTE — Assessment & Plan Note (Signed)
-   Patient noted to have recurrent thrombocytopenia on blood work with a platelet count of 74 ?-He has been thrombocytopenic in the past and has also been noted to have platelet clumping.  However, his last platelet count prior to this was in the 200s.  Patient does have a history of alcoholic cirrhosis which may be causing thrombocytopenia. ?-We will have patient set up for a lab appointment to repeat his CBC.  We will do this at the [facility] lab.  Discussed with Olivia Mackie from the lab who will arrange this ?-No further work-up at this time ?

## 2021-09-03 LAB — CMP14 + ANION GAP
ALT: 53 IU/L — ABNORMAL HIGH (ref 0–44)
AST: 105 IU/L — ABNORMAL HIGH (ref 0–40)
Albumin/Globulin Ratio: 1.1 — ABNORMAL LOW (ref 1.2–2.2)
Albumin: 4.1 g/dL (ref 3.8–4.9)
Alkaline Phosphatase: 213 IU/L — ABNORMAL HIGH (ref 44–121)
Anion Gap: 14 mmol/L (ref 10.0–18.0)
BUN/Creatinine Ratio: 11 (ref 9–20)
BUN: 9 mg/dL (ref 6–24)
Bilirubin Total: 1 mg/dL (ref 0.0–1.2)
CO2: 24 mmol/L (ref 20–29)
Calcium: 9 mg/dL (ref 8.7–10.2)
Chloride: 101 mmol/L (ref 96–106)
Creatinine, Ser: 0.85 mg/dL (ref 0.76–1.27)
Globulin, Total: 3.7 g/dL (ref 1.5–4.5)
Glucose: 85 mg/dL (ref 70–99)
Potassium: 4.2 mmol/L (ref 3.5–5.2)
Sodium: 139 mmol/L (ref 134–144)
Total Protein: 7.8 g/dL (ref 6.0–8.5)
eGFR: 101 mL/min/{1.73_m2} (ref >59)

## 2021-09-03 LAB — PHOSPHORUS: Phosphorus: 2.9 mg/dL (ref 2.8–4.1)

## 2021-09-03 LAB — MAGNESIUM: Magnesium: 2.2 mg/dL (ref 1.6–2.3)

## 2021-09-05 NOTE — Addendum Note (Signed)
Addended by: [name] on: 09/05/2021 07:15 AM   Modules accepted: Orders

## 2021-09-05 NOTE — Progress Notes (Signed)
Internal Medicine Clinic Attending  Case discussed with Dr. [name]  At the time of the visit.  We reviewed the resident's history and exam and pertinent patient test results.  I agree with the assessment, diagnosis, and plan of care documented in the resident's note.  

## 2021-09-05 NOTE — Assessment & Plan Note (Signed)
Patient continues to endorse cramps in lower extremities. Likely secondary to his alcohol use disorder. Can consider vitamin E supplementation.  Do not believe continuation of his muscle relaxers with his history of alcohol use disorder would be the best plan moving forward.  He does not have any electrolyte abnormalities on his lab work. ?

## 2021-09-05 NOTE — Assessment & Plan Note (Signed)
Assessment: ?Current regimen of olmesartan 20 mg daily. Normotensive on examination today. Will discuss adherence to medication regimen on follow up phone call. If patient has not been taking his anti-hypertensives, normotensive state may be secondary to worsening liver disease ? ?Plan: ?-follow up medication adherence ?

## 2021-09-06 ENCOUNTER — Encounter: Payer: Self-pay | Admitting: Student

## 2021-09-06 MED ORDER — NALTREXONE HCL 50 MG PO TABS: 50.0000 mg | ORAL_TABLET | Freq: Every day | ORAL | 0 refills | Status: DC

## 2021-09-06 NOTE — Addendum Note (Signed)
Addended by: [name] on: 09/06/2021 05:04 PM ? ? Modules accepted: Orders ? ?

## 2021-09-06 NOTE — Assessment & Plan Note (Signed)
Over 20 pack year history and continues to smoke .5 PPD. Will screen with low dose ct chest for lung cancer. Patient agreeable to this.  ?

## 2021-09-13 ENCOUNTER — Encounter: Payer: Self-pay | Admitting: Student

## 2021-09-21 ENCOUNTER — Other Ambulatory Visit: Payer: Medicaid Other

## 2021-09-23 ENCOUNTER — Other Ambulatory Visit: Payer: Medicaid Other

## 2021-09-23 ENCOUNTER — Telehealth: Payer: Self-pay | Admitting: *Deleted

## 2021-09-23 NOTE — Telephone Encounter (Signed)
Sounds good, we can repeat the labs at his follow up.  ? ?Thank you! ?
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
## 2021-09-26 ENCOUNTER — Other Ambulatory Visit: Payer: Self-pay

## 2021-09-26 ENCOUNTER — Ambulatory Visit (INDEPENDENT_AMBULATORY_CARE_PROVIDER_SITE_OTHER): Payer: Medicaid Other | Admitting: Student

## 2021-09-26 ENCOUNTER — Ambulatory Visit (HOSPITAL_COMMUNITY)
Admission: RE | Admit: 2021-09-26 | Discharge: 2021-09-26 | Disposition: A | Discharge status: Home / Self Care | Payer: Medicaid Other | Source: Ambulatory Visit | Attending: Internal Medicine | Admitting: Internal Medicine

## 2021-09-26 ENCOUNTER — Other Ambulatory Visit: Payer: Self-pay | Admitting: Student

## 2021-09-26 VITALS — BP 142/88 | HR 82 | Temp 98.4°F | Ht 67.0 in | Wt 152.6 lb

## 2021-09-26 DIAGNOSIS — D696 Thrombocytopenia, unspecified: Secondary | ICD-10-CM | POA: Diagnosis not present

## 2021-09-26 DIAGNOSIS — D539 Nutritional anemia, unspecified: Secondary | ICD-10-CM | POA: Diagnosis not present

## 2021-09-26 DIAGNOSIS — D649 Anemia, unspecified: Secondary | ICD-10-CM | POA: Diagnosis not present

## 2021-09-26 DIAGNOSIS — I10 Essential (primary) hypertension: Secondary | ICD-10-CM | POA: Diagnosis not present

## 2021-09-26 DIAGNOSIS — K7030 Alcoholic cirrhosis of liver without ascites: Secondary | ICD-10-CM

## 2021-09-26 DIAGNOSIS — M25562 Pain in left knee: Secondary | ICD-10-CM

## 2021-09-26 DIAGNOSIS — F1020 Alcohol dependence, uncomplicated: Secondary | ICD-10-CM

## 2021-09-26 LAB — CBC
HCT: 39.8 % (ref 39.0–52.0)
Hemoglobin: 13.3 g/dL (ref 13.0–17.0)
MCH: 35.5 pg — ABNORMAL HIGH (ref 26.0–34.0)
MCHC: 33.4 g/dL (ref 30.0–36.0)
MCV: 106.1 fL — ABNORMAL HIGH (ref 80.0–100.0)
Platelets: 48 10*3/uL — ABNORMAL LOW (ref 150–400)
RBC: 3.75 MIL/uL — ABNORMAL LOW (ref 4.22–5.81)
RDW: 16 % — ABNORMAL HIGH (ref 11.5–15.5)
WBC: 4 10*3/uL (ref 4.0–10.5)
nRBC: 0 % (ref 0.0–0.2)

## 2021-09-26 LAB — PROTIME-INR
INR: 1.2 (ref 0.8–1.2)
Prothrombin Time: 15.2 seconds (ref 11.4–15.2)

## 2021-09-26 NOTE — Patient Instructions (Signed)
Thank you, Mr.Tamaj E Tobi Bastos. for allowing [place] to provide your care today. Today we discussed. ? ?Cirrhosis ?Please follow up with the GI (liver specialist) doctors. You are doing a great job at decreasing alcohol, please keep up the good work! ? ?Left Knee Pain ?We will be getting xrays of your left hip and knee ot make sure there are no abnormalities with the nail that was placed.  ? ?Alcohol Use ?You are doing a great job! Keep taking the naltrexone    ? ?I have ordered the following labs for you: ? ?Lab Orders  ?No laboratory test(s) ordered today  ?  ? ?Referrals ordered today:  ? ?Referral Orders    ?     Ambulatory referral to Gastroenterology    ?  ? ?I have ordered the following medication/changed the following medications:  ? ?Stop the following medications: ?There are no discontinued medications.  ? ?Start the following medications: ?No orders of the defined types were placed in this encounter. ?  ? ?Follow up: 3 month follow up ? ? ?Should you have any questions or concerns please call the internal medicine clinic at [phone].   ? ?Sanjuana Letters, D.O. ?[place] ?  ?

## 2021-09-26 NOTE — Assessment & Plan Note (Addendum)
Assessment: ?Hisory of nodular liver on CT a/p and [place] consistent with cirrhosis. MELD score of 12, Child Class A ?FIB-4 of 11.11, advanced fibrosis (METAVIR stage F3-F4). Approximate fibrosis stage Ishak 4-6. ? ?Prior evaluation he had elevated PT/INR, however, has normalized on today's exam. He continues to have thrombocytopenia. He has had significant decreased in alcohol craving with naltrexone therapy, only drinking one 40oz beer on the weekends. When cirrhosis was brought up during this visit he was surprised to hear this and does not recall having a conversation about this prior. Readdressed that we have discussed this in the past but at times it was thought he was acutely intoxicated during our appointment. Discussed what cirrhosis was and how we are seeing signs of his synthetic function not working appropriately (repeat PT/INR normal today). Discussed how the first step is to stop alcohol use, which he is doing well with naltrexone therapy.  ? ?No evidence of decompensatory state on exam today. He does not have ascites on exam. No evidence of jaundice or telangectasias.  ? ?His questions were answered today and we will refer to GI for further evaluation and management.  ? ?Plan: ?-continue naltrexone therapy ?-continue to monitor for signs of decompensation ?-follow up with GI ?

## 2021-09-26 NOTE — Assessment & Plan Note (Signed)
Assessment: ?BP 142/88 today. Will follow up with patient tomorrow and see if he is continuing to take is olmesartan. When evaluated earlier this month he was normotensive.  ? ?Plan: ?-follow up adherence to olmesartan.  ?

## 2021-09-27 DIAGNOSIS — M25562 Pain in left knee: Secondary | ICD-10-CM | POA: Insufficient documentation

## 2021-09-27 LAB — TECHNOLOGIST SMEAR REVIEW: Plt Morphology: DECREASED

## 2021-09-27 LAB — IMMATURE PLATELET FRACTION: Immature Platelet Fraction: 10.4 % — ABNORMAL HIGH (ref 1.2–8.6)
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
## 2021-09-27 NOTE — Assessment & Plan Note (Addendum)
Assessment: ?Repeat CBC with platelet count of 48. Immature platelet fraction slightly elevated at 10.4%. Peripheral smear pending. Since his last visit a few weeks ago, he has decreased his alcohol use to 1 40 oz beer on the weekends. I suspect this is from his alcohol use disorder/nutritional deficiencies. Will have patient follow up in one week for repeat labs. Hep c and HIV testing negative in the past. ? ?If persistent or worsening disease, will plan to repeat abdominal US and refer to heme if necessary. Referral placed for GI during this visit.   ? ?Plan: ?-repeat labs next week ? ?Addendum: ?Plan to repeat CBC next week. Blood smear with evidence of slightly low platelets, normal WBC and Hgb. Will repeat US to assess for portal hypertension/hypersplenism ?

## 2021-09-27 NOTE — Assessment & Plan Note (Signed)
Assessment: ?Macrocytic anemia on examination today. Will order folate and B12 levels, suspect nutritional deficiency with history of alcohol use disorder. Low suspicion for acute bleed.  ? ?Plan: ?-b12 and folate levels on repeat lab work next week ?

## 2021-09-27 NOTE — Assessment & Plan Note (Signed)
Assessment: ?Since starting naltrexone therapy during his last visit, Mr. Klemz has noticed significant reduction in his alcohol cravings. His fiance, family, and friends have noticed a difference in his mood and alcohol use. He feels better and is only drinking once on the weekends. No evidence or signs of withdrawal on exam. Continue to monitor and discuss cessation.  ? ?Plan: ?-continue naltrexone and follow up on cessation ?

## 2021-09-28 ENCOUNTER — Encounter: Payer: Medicaid Other | Admitting: Student

## 2021-09-29 ENCOUNTER — Ambulatory Visit: Payer: Medicaid Other | Admitting: Behavioral Health

## 2021-09-29 DIAGNOSIS — F1020 Alcohol dependence, uncomplicated: Secondary | ICD-10-CM

## 2021-09-29 DIAGNOSIS — F331 Major depressive disorder, recurrent, moderate: Secondary | ICD-10-CM

## 2021-09-29 DIAGNOSIS — F419 Anxiety disorder, unspecified: Secondary | ICD-10-CM

## 2021-09-29 LAB — PATHOLOGIST SMEAR REVIEW

## 2021-09-29 NOTE — Addendum Note (Signed)
Addended by: [name] on: 09/29/2021 07:09 PM ? ? Modules accepted: Orders ? ?

## 2021-09-29 NOTE — Progress Notes (Signed)
Internal Medicine Clinic Attending  Case discussed with Dr. [name]  At the time of the visit.  We reviewed the resident's history and exam and pertinent patient test results.  I agree with the assessment, diagnosis, and plan of care documented in the resident's note.  

## 2021-09-29 NOTE — BH Specialist Note (Signed)
Integrated Behavioral Health via Telemedicine Visit ? ?09/29/2021 ?[name]. ?[id number] ? ?Number of [place] Clinician visits: 10 ?Session Start time: 0900 ?Session End time: 1062 ?Total time in minutes: 45 min ? ?Referring Provider: Dr. Morrison Old, DO ?Patient/Family location: Pt is home in private ?[facility] Provider location: [facility] Office  ?All persons participating in visit: Pt & Clinician ?Types of Service: Individual psychotherapy ? ?I connected with [name] and/or Dylan Limes Jr.'s  self  via  Telephone or Video Enabled Telemedicine Application  (Video is Caregility application) and verified that I am speaking with the correct person using two identifiers. Discussed confidentiality: Yes  ? ?I discussed the limitations of telemedicine and the availability of in person appointments.  Discussed there is a possibility of technology failure and discussed alternative modes of communication if that failure occurs. ? ?I discussed that engaging in this telemedicine visit, they consent to the provision of behavioral healthcare and the services will be billed under their insurance. ? ?Patient and/or legal guardian expressed understanding and consented to Telemedicine visit: Yes  ? ?Presenting Concerns: ?Patient and/or family reports the following symptoms/concerns: reduction in use of ETOH has resulted in Pt feeling healthier & waking up daily w/a sense of inc'd meaning & love for his life. Pt & Sig Other have inc'd, positive communication; she told him he is so much easier to be around when his mood is better. Pt is avoiding liq & drinking more water. He is determined to stay focused on the present. ?Duration of problem: yrs of ETOH use/abuse; Severity of problem: moderate & could trend severe if Pt does not cessate the use of ETOH; Pt is aware & frank discussion w/PCP assisted him to make decision to reverse the impact on his health. ? ?Patient and/or Family's  Strengths/Protective Factors: ?Social connections, Social and Patent attorney, Concrete supports in place (healthy food, safe environments, etc.), Sense of purpose, Physical Health (exercise, healthy diet, medication compliance, etc.), and Caregiver has knowledge of parenting & child development. Pt has a faith belief that he is strengthening daily w/an app that wakes him up so he can read a Scripture, pray, & take a walk first thing. ? ?Pt sts, "I have a lot to live for. I'm tired of being tired." Pt has 58yo [employer] whom he loves dearly. He spends time w/her daily & they walk together. ? ?Pt is trying to use his past memories in a positive way by pulling out the good & leaving the bad behind. The loss of his Wife was devastating, but he reminds himself of all the fun they had & the good she brought to his life. ? ?Pt's Sig Other is a lady who believes in his goodness. He is thankful for her. They want to spend the future tgthr in good health.  ? ?Pt is considering asking his 55yo friend in the neighborhood if he can attend AA Mtgs w/him.  ? ?Goals Addressed: ?Patient will: ? Reduce symptoms of: anxiety and depression  ? Increase knowledge and/or ability of: coping skills and healthy habits  ? Demonstrate ability to: Increase healthy adjustment to current life circumstances, Decrease self-medicating behaviors, and reminding yourself to stick to your Plan.  ? ?Progress towards Goals: ?Revised today; Pt will attend psychotherapy sessions for support, encouragement & accountability every 2-3 wks. Pt will connect w/his Neighbor & attend AA Mtg. ? ?Interventions: ?Interventions utilized:  Solution-Focused Strategies, Supportive Counseling, and Psychoeducation and/or Health Education ?Standardized Assessments completed:  screeners prn ? ?Patient and/or Family Response: Pt receptive to call today & plans on sessions in the future. ? ?Assessment: ?Patient currently experiencing an awakening about his health @ his  last visit w/PCP. He is making positive changes & using his determination & will to live as encouragement. ? ?Patient may benefit from cont'd psychotherapy & other resources as identified. ? ?Plan: ?Follow up with behavioral health clinician on : 2-3 wks for 60 min telehealth ?Behavioral recommendations: Cont to use accountability to others & yourself to keep motivated. Stay busy. ?Referral(s): [place] (In Clinic) and AA Mtgs ? ?I discussed the assessment and treatment plan with the patient and/or parent/guardian. They were provided an opportunity to ask questions and all were answered. They agreed with the plan and demonstrated an understanding of the instructions. ?  ?They were advised to call back or seek an in-person evaluation if the symptoms worsen or if the condition fails to improve as anticipated. ? ?Donnetta Hutching, LMFT ?

## 2021-10-05 ENCOUNTER — Ambulatory Visit (HOSPITAL_COMMUNITY): Admission: RE | Admit: 2021-10-05 | Payer: Medicaid Other | Source: Ambulatory Visit

## 2021-10-12 ENCOUNTER — Ambulatory Visit (HOSPITAL_BASED_OUTPATIENT_CLINIC_OR_DEPARTMENT_OTHER): Payer: Medicaid Other | Admitting: Orthopaedic Surgery

## 2021-10-13 ENCOUNTER — Other Ambulatory Visit: Payer: Self-pay | Admitting: Student

## 2021-10-14 ENCOUNTER — Ambulatory Visit (INDEPENDENT_AMBULATORY_CARE_PROVIDER_SITE_OTHER): Payer: Medicaid Other | Admitting: Orthopaedic Surgery

## 2021-10-14 DIAGNOSIS — M1612 Unilateral primary osteoarthritis, left hip: Secondary | ICD-10-CM
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
## 2021-10-26 ENCOUNTER — Other Ambulatory Visit: Payer: Self-pay | Admitting: Student

## 2021-10-27 ENCOUNTER — Ambulatory Visit: Payer: Medicaid Other | Admitting: Behavioral Health

## 2021-10-27 DIAGNOSIS — F331 Major depressive disorder, recurrent, moderate: Secondary | ICD-10-CM

## 2021-10-27 DIAGNOSIS — F419 Anxiety disorder, unspecified: Secondary | ICD-10-CM

## 2021-10-27 NOTE — BH Specialist Note (Addendum)
Integrated Behavioral Health via Telemedicine Visit ? ?10/27/2021 ?[name]. ?[id number] ? ?Number of [place] Clinician visits: 11 ?Session Start time: 1300 ?Session End time: 6381 ?Total time in minutes: 45 min ? ?Referring Provider: Dr. Morrison Old, DO ?Patient/Family location: Pt is home in private ?[facility] Provider location: Working remotely ?All persons participating in visit: Pt & Clinician ?Types of Service: Individual psychotherapy ? ?I connected with [name] and/or Dylan Limes Jr.'s  self  via  Telephone or Video Enabled Telemedicine Application  (Video is Caregility application) and verified that I am speaking with the correct person using two identifiers. Discussed confidentiality: Yes  ? ?I discussed the limitations of telemedicine and the availability of in person appointments.  Discussed there is a possibility of technology failure and discussed alternative modes of communication if that failure occurs. ? ?I discussed that engaging in this telemedicine visit, they consent to the provision of behavioral healthcare and the services will be billed under their insurance. ? ?Patient and/or legal guardian expressed understanding and consented to Telemedicine visit: Yes  ? ?Presenting Concerns: ?Patient and/or family reports the following symptoms/concerns: elevated anx/dep & relapse in visit w/friends last evening using ETOH ?Duration of problem: months; Severity of problem: moderate ? ?Patient and/or Family's Strengths/Protective Factors: ?Social and Emotional competence and Concrete supports in place (healthy food, safe environments, etc.) ? ?Goals Addressed: ?Patient will: ? Reduce symptoms of: anxiety, depression, and stress  ? Increase knowledge and/or ability of: coping skills, healthy habits, and stress reduction  ? Demonstrate ability to: Increase healthy adjustment to current life circumstances ? ?Progress towards  Goals: ?Ongoing ? ?Interventions: ?Interventions utilized:  Supportive Counseling ?Standardized Assessments completed:  Unable to test due to intoxication ? ?Call [facility] @ [phone] to secure info about [facility] & how they support Rehab placement ? ?Patient and/or Family Response: Pt is receptive to call & questioning his own beh w/ETOH ? ?Assessment: ?Patient currently experiencing elevated anx/dep & sorrow for his beh last evening.  ? ?Patient may benefit from cont'd Cslg & outside support for Recovery Support/Relapse Prevention. ? ?Plan: ?Follow up with behavioral health clinician on : 2-3 wks foe 30 min f/u via telehealth ?Behavioral recommendations: Go to AA Mtg. ?Referral(s): [place] (In Clinic) ? ?I discussed the assessment and treatment plan with the patient and/or parent/guardian. They were provided an opportunity to ask questions and all were answered. They agreed with the plan and demonstrated an understanding of the instructions. ?  ?They were advised to call back or seek an in-person evaluation if the symptoms worsen or if the condition fails to improve as anticipated. ? ?Donnetta Hutching, LMFT ?

## 2021-11-04 ENCOUNTER — Ambulatory Visit (HOSPITAL_COMMUNITY): Admission: RE | Admit: 2021-11-04 | Payer: Medicaid Other | Source: Ambulatory Visit

## 2021-11-06 IMAGING — US US ABDOMEN LIMITED
1 series · 14 of 25 positions shown · non-contrast
Comparison: None.

CLINICAL DATA: Elevated liver enzymes

EXAM:
ULTRASOUND ABDOMEN LIMITED RIGHT UPPER QUADRANT

[Series 1: abdomen us · 14 of 88 slices shown]
[im 1/88]
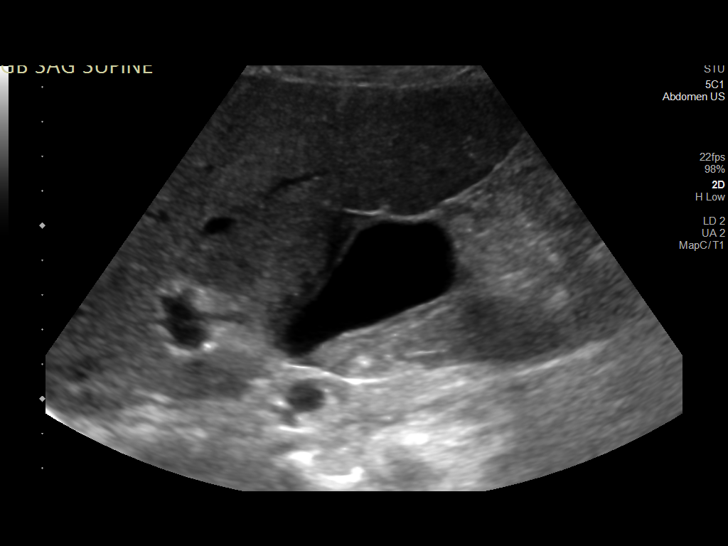
[im 8/88]
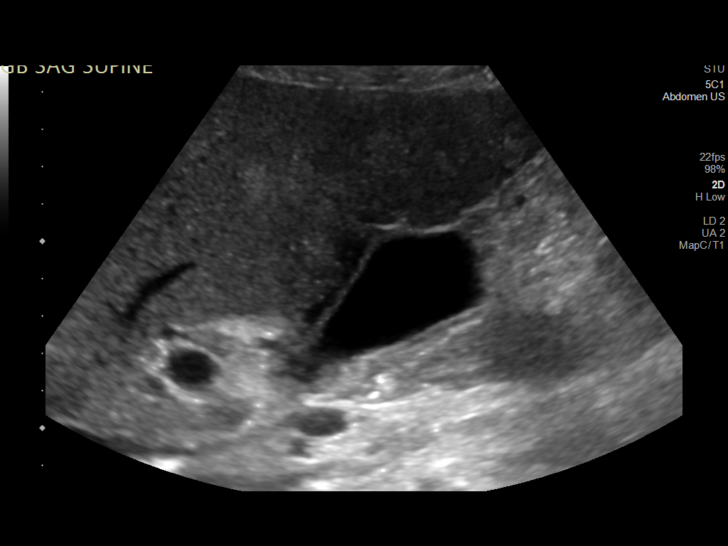
[im 15/88]
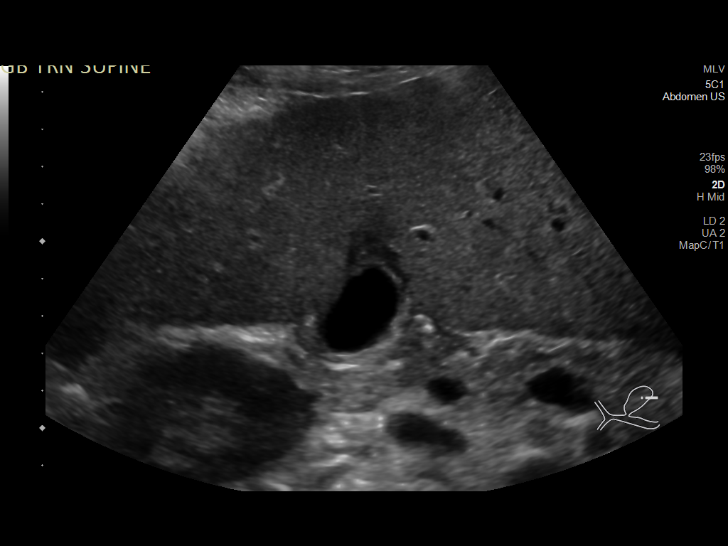
[im 22/88]
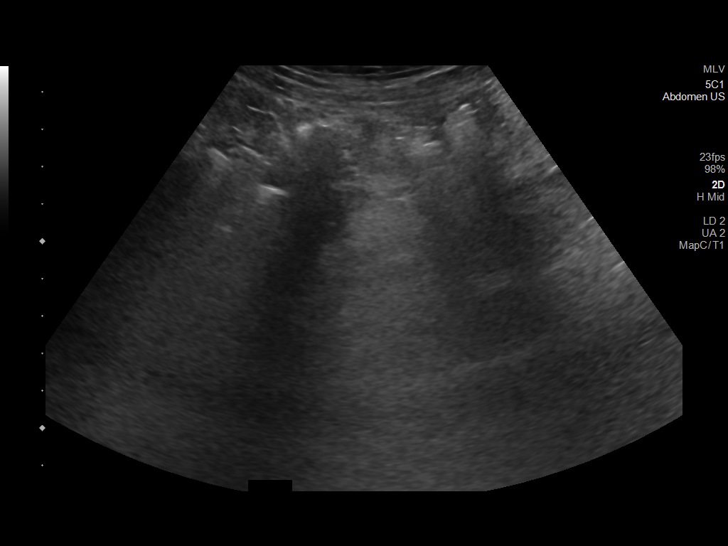
[im 30/88]
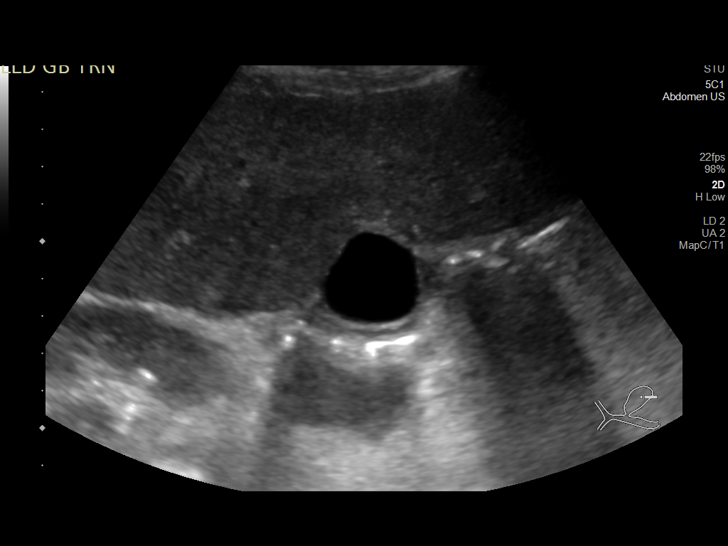
[im 33/88]
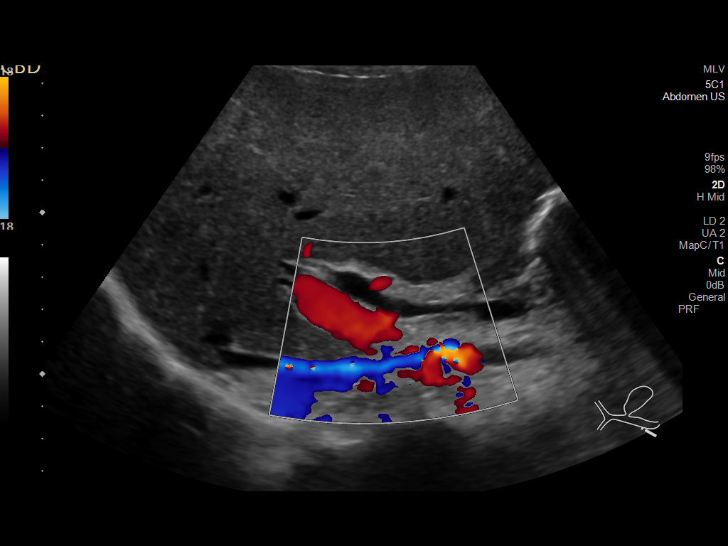
[im 40/88]
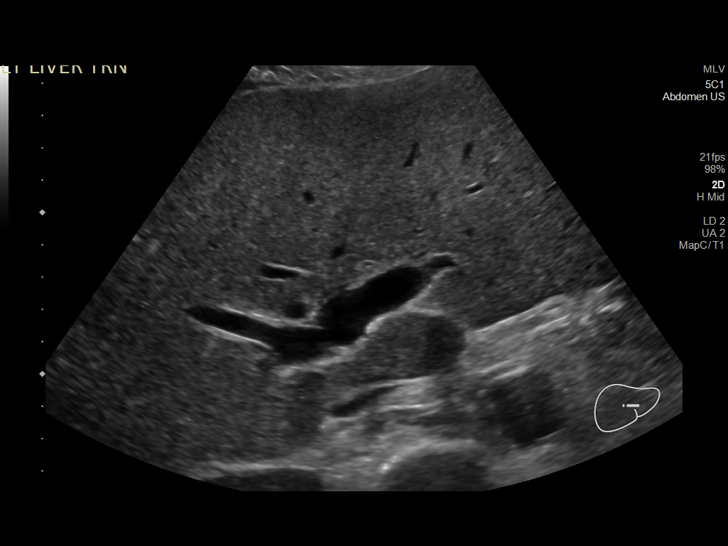
[im 48/88]
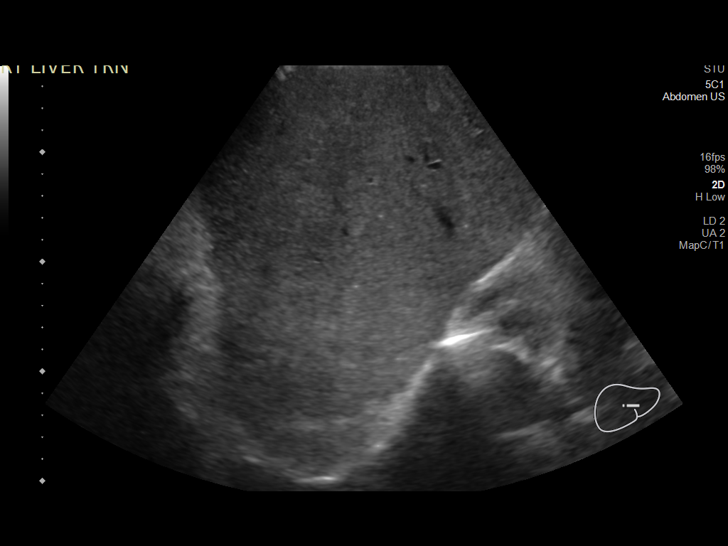
[im 55/88]
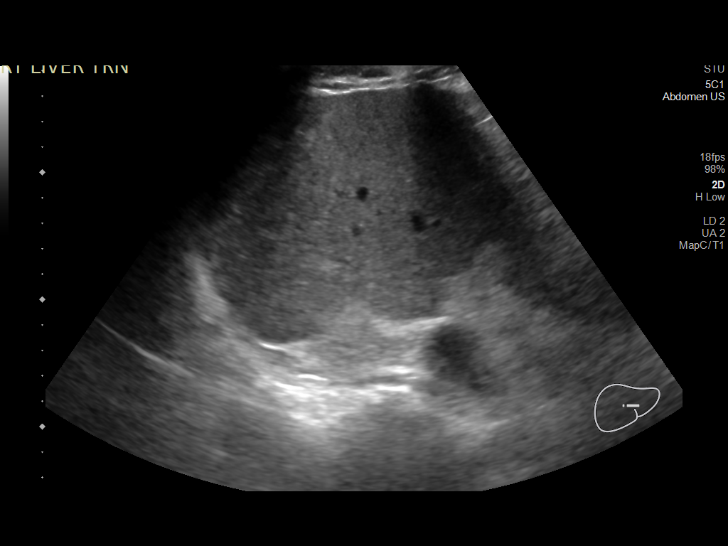
[im 59/88]
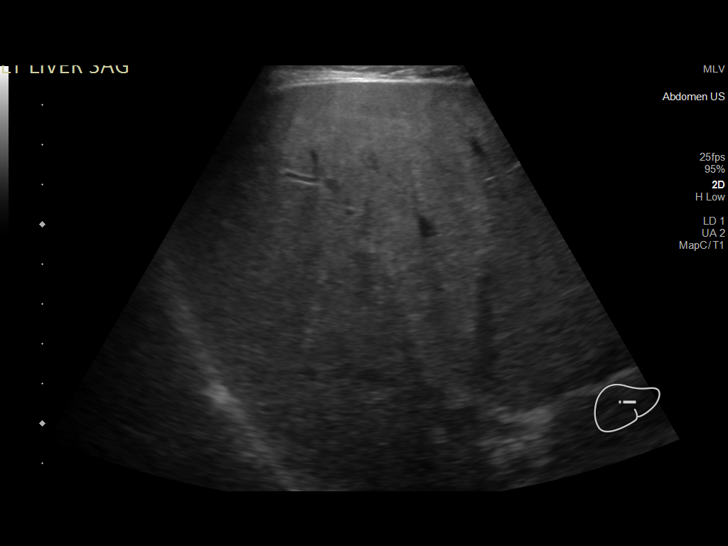
[im 66/88]
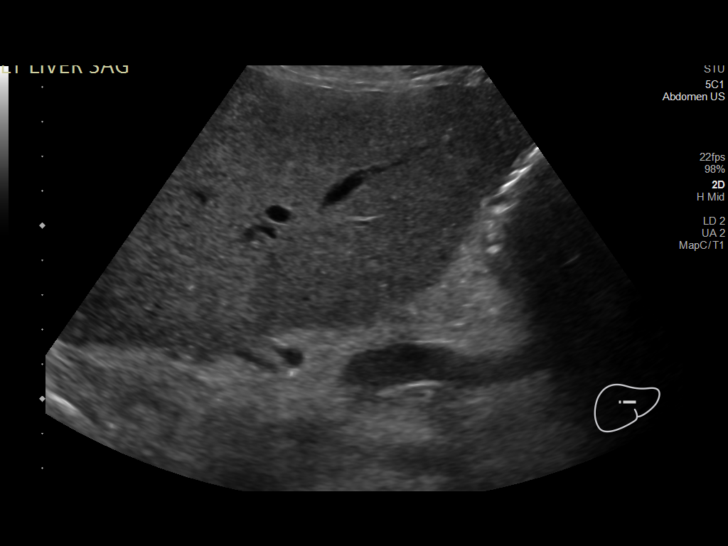
[im 73/88]
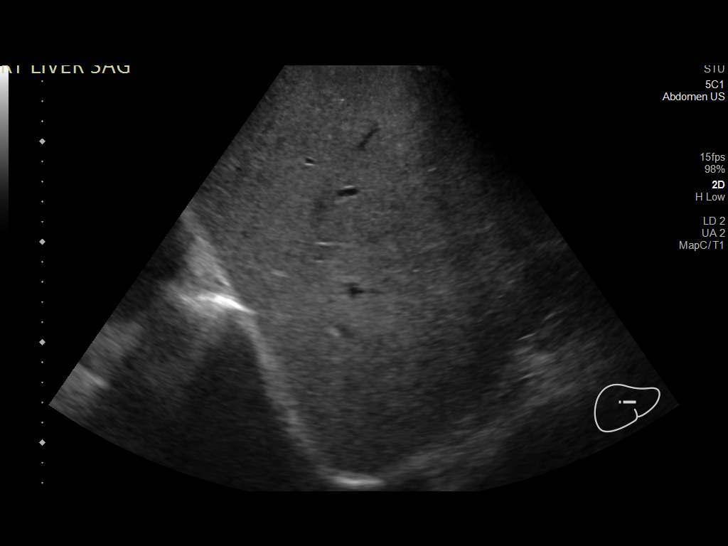
[im 80/88]
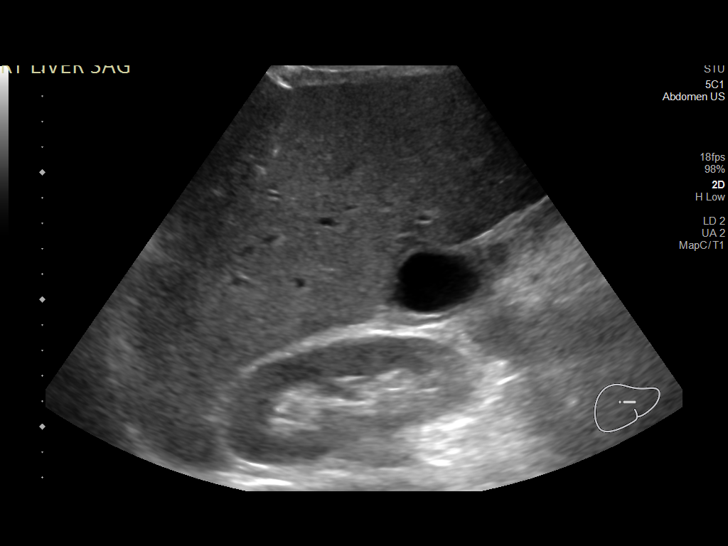
[im 88/88]
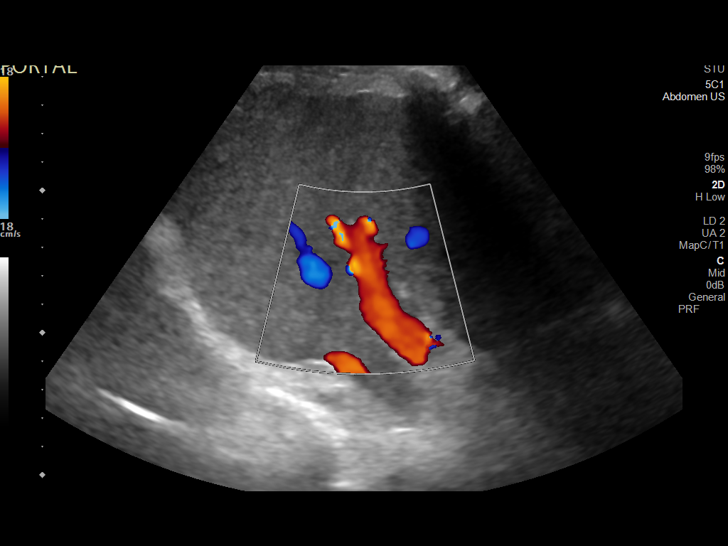

[14 of 25 positions shown; findings below may reference images not displayed]

FINDINGS: Gallbladder:

No gallstones or wall thickening visualized. No sonographic Murphy
sign noted by sonographer.

Common bile duct:

Diameter: 6 mm

Liver:

Liver demonstrates heterogeneous echotexture with a "starry sky"
appearance, which can be associated with acute hepatitis. No
intrahepatic duct dilation. Portal vein is patent on color Doppler
imaging with normal direction of blood flow towards the liver.

Other: Trace free fluid within the right upper quadrant.
IMPRESSION: 1. Heterogeneous echotexture of the liver, with a pattern suggesting
acute hepatitis.
2. Trace ascites.

## 2021-11-10 ENCOUNTER — Institutional Professional Consult (permissible substitution): Payer: Medicaid Other | Admitting: Behavioral Health

## 2021-11-10 ENCOUNTER — Telehealth: Payer: Self-pay

## 2021-11-10 ENCOUNTER — Telehealth: Payer: Self-pay | Admitting: Behavioral Health

## 2021-11-10 NOTE — Telephone Encounter (Signed)
Tried Pt phone twice & lft msg for today's appt. Pt RC to [facility]-he was mowing the lawn.  ? ?Ten min session w/Pt. He is doing well. Both his Sons are in the home living. His StepSon's living situation is temporary, but he is glad to have them @ home. Pt has not initiated AA Mtgs w/his neighbor. Neighbor has sick Wife & is busy. ? ?Since moderating his use of ETOH, Pt has discovered he likes sweets. He is eating more instead of drinking & this has reduced the visits he has from friends who like to drink. This does not bother him, as he respects everyone's pers choices. Pt hopes to stop smoking soon.  ? ?Pt would like to r/s for full 30 min telehealth ck-in. ? ?Dr. [name] ?

## 2021-11-10 NOTE — Telephone Encounter (Signed)
Patient is requesting a referral to GI for a colonoscopy. ?

## 2021-11-24 ENCOUNTER — Telehealth: Payer: Self-pay | Admitting: Behavioral Health

## 2021-11-24 ENCOUNTER — Institutional Professional Consult (permissible substitution): Payer: Medicaid Other | Admitting: Behavioral Health

## 2021-11-24 NOTE — Telephone Encounter (Signed)
Lft msg for Pt on pers cell re: today's [facility] telehealth session. Unable to contact Pt on [phone] as phone rang & then went to busy signal. Pt can RC to [facility] & r/s @ his convenience.   Dr. [name]

## 2021-11-25 ENCOUNTER — Other Ambulatory Visit: Payer: Self-pay | Admitting: Student

## 2021-12-21 ENCOUNTER — Other Ambulatory Visit: Payer: Self-pay | Admitting: Student

## 2022-01-10 ENCOUNTER — Telehealth: Payer: Self-pay | Admitting: Student

## 2022-01-10 NOTE — Telephone Encounter (Signed)
Pt requesting a call back.  Pt states he is ready to go to an [place] as soon as possible and he needs help.

## 2022-01-11 ENCOUNTER — Other Ambulatory Visit: Payer: Self-pay | Admitting: Student

## 2022-01-11 DIAGNOSIS — F1020 Alcohol dependence, uncomplicated: Secondary | ICD-10-CM

## 2022-01-15 ENCOUNTER — Other Ambulatory Visit: Payer: Self-pay | Admitting: Student

## 2022-01-16 NOTE — Telephone Encounter (Signed)
Next appt scheduled 7/27 with PCP.

## 2022-01-23 ENCOUNTER — Other Ambulatory Visit: Payer: Self-pay | Admitting: Student

## 2022-01-26 ENCOUNTER — Ambulatory Visit (INDEPENDENT_AMBULATORY_CARE_PROVIDER_SITE_OTHER): Payer: Medicare Other | Admitting: Student

## 2022-01-26 VITALS — BP 137/81 | HR 101 | Temp 98.3°F | Wt 148.2 lb

## 2022-01-26 DIAGNOSIS — D649 Anemia, unspecified: Secondary | ICD-10-CM

## 2022-01-26 DIAGNOSIS — I10 Essential (primary) hypertension: Secondary | ICD-10-CM

## 2022-01-26 DIAGNOSIS — Z0000 Encounter for general adult medical examination without abnormal findings: Secondary | ICD-10-CM

## 2022-01-26 DIAGNOSIS — F1020 Alcohol dependence, uncomplicated: Secondary | ICD-10-CM

## 2022-01-26 DIAGNOSIS — R61 Generalized hyperhidrosis: Secondary | ICD-10-CM

## 2022-01-26 DIAGNOSIS — R9389 Abnormal findings on diagnostic imaging of other specified body structures: Secondary | ICD-10-CM

## 2022-01-26 DIAGNOSIS — F3342 Major depressive disorder, recurrent, in full remission: Secondary | ICD-10-CM

## 2022-01-26 DIAGNOSIS — R159 Full incontinence of feces: Secondary | ICD-10-CM

## 2022-01-26 DIAGNOSIS — Z122 Encounter for screening for malignant neoplasm of respiratory organs: Secondary | ICD-10-CM

## 2022-01-26 DIAGNOSIS — K7030 Alcoholic cirrhosis of liver without ascites: Secondary | ICD-10-CM

## 2022-01-26 DIAGNOSIS — F17210 Nicotine dependence, cigarettes, uncomplicated: Secondary | ICD-10-CM

## 2022-01-26 DIAGNOSIS — D539 Nutritional anemia, unspecified: Secondary | ICD-10-CM

## 2022-01-26 DIAGNOSIS — D696 Thrombocytopenia, unspecified: Secondary | ICD-10-CM

## 2022-01-26 NOTE — Addendum Note (Signed)
Addended by: [name] on: 01/26/2022 02:00 PM   Modules accepted: Orders

## 2022-01-26 NOTE — Patient Instructions (Signed)
Thank you, [name]. for allowing [place] to provide your care today. Today we discussed.  Alcohol Use Disorder We will work to have you follow up with a detoxification and rehabilitation facility. Please do not stop drinking "cold [place]" as this will lead to withdrawals.   Cirrhosis We will be repeating blood work today as well as imaging of your liver. Please follow up with (GI).   Loss of bowel function You will need a colonscopy and to follow up with the liver doctors (GI). If you notice you have new numbness or tingling, back pain, start urinating on yourself without knowing, please call our clinic or go to the closest emergency department.   Cancer Screening You have a CT scan of the chest ordered to follow up on a pulmonary nodule. Please follow up with this.   I have ordered the following labs for you:   Lab Orders         CMP14 + Anion Gap         CBC no Diff         AFP Tumor Marker      Referrals ordered today:   Referral Orders  No referral(s) requested today     I have ordered the following medication/changed the following medications:   Stop the following medications: There are no discontinued medications.   Start the following medications: No orders of the defined types were placed in this encounter.    Follow up:  1 month with Dr. Cain Sieve     Should you have any questions or concerns please call the internal medicine clinic at [phone].    Sanjuana Letters, D.O. [place]

## 2022-01-27 DIAGNOSIS — R61 Generalized hyperhidrosis: Secondary | ICD-10-CM

## 2022-01-27 HISTORY — DX: Generalized hyperhidrosis: R61

## 2022-01-27 LAB — CMP14 + ANION GAP
ALT: 43 IU/L (ref 0–44)
AST: 96 IU/L — ABNORMAL HIGH (ref 0–40)
Albumin/Globulin Ratio: 1.1 — ABNORMAL LOW (ref 1.2–2.2)
Albumin: 4.2 g/dL (ref 3.8–4.9)
Alkaline Phosphatase: 146 IU/L — ABNORMAL HIGH (ref 44–121)
Anion Gap: 25 mmol/L — ABNORMAL HIGH (ref 10.0–18.0)
BUN/Creatinine Ratio: 10 (ref 9–20)
BUN: 7 mg/dL (ref 6–24)
Bilirubin Total: 0.9 mg/dL (ref 0.0–1.2)
CO2: 17 mmol/L — ABNORMAL LOW (ref 20–29)
Calcium: 8.8 mg/dL (ref 8.7–10.2)
Chloride: 98 mmol/L (ref 96–106)
Creatinine, Ser: 0.68 mg/dL — ABNORMAL LOW (ref 0.76–1.27)
Globulin, Total: 3.7 g/dL (ref 1.5–4.5)
Glucose: 84 mg/dL (ref 70–99)
Potassium: 3.8 mmol/L (ref 3.5–5.2)
Sodium: 140 mmol/L (ref 134–144)
Total Protein: 7.9 g/dL (ref 6.0–8.5)
eGFR: 108 mL/min/{1.73_m2} (ref >59)

## 2022-01-27 LAB — CBC
Hematocrit: 41.1 % (ref 37.5–51.0)
Hemoglobin: 13.9 g/dL (ref 13.0–17.7)
MCH: 34.2 pg — ABNORMAL HIGH (ref 26.6–33.0)
MCHC: 33.8 g/dL (ref 31.5–35.7)
MCV: 101 fL — ABNORMAL HIGH (ref 79–97)
Platelets: 100 10*3/uL — CL (ref 150–450)
RBC: 4.06 x10E6/uL — ABNORMAL LOW (ref 4.14–5.80)
RDW: 13.1 % (ref 11.6–15.4)
WBC: 5.7 10*3/uL (ref 3.4–10.8)

## 2022-01-27 LAB — VITAMIN B12: Vitamin B-12: 990 pg/mL (ref 232–1245)

## 2022-01-27 LAB — AFP TUMOR MARKER: AFP, Serum, Tumor Marker: 3 ng/mL (ref 0.0–8.4)

## 2022-01-27 MED ORDER — DULOXETINE HCL 60 MG PO CPEP: 60.0000 mg | ORAL_CAPSULE | Freq: Every day | ORAL | 1 refills | Status: DC

## 2022-01-27 NOTE — Assessment & Plan Note (Addendum)
CBC with normal hemoglobin and elevated MCV. B12 normal. Suspect secondary to his alcohol use disorder.   -continue to trend hemoglobin levels

## 2022-01-27 NOTE — Assessment & Plan Note (Addendum)
Hx of alcoholic cirrhosis, hep b/c negative in the past. No evidence to suspect wilson's or hemochromatosis.  MELD score of 12, Child Class A FIB-4 of 11.11, advanced fibrosis (METAVIR stage F3-F4). Approximate fibrosis stage Ishak 4-6. Unfortunately has increased alcohol intake and is now cosuming alcohol and beer daily. He was unaware he had a GI appointment scheduled and missed the appointment. He also did not return for his follow up labs or RUQ [place].   He presents today wanting detox/rehab for his alcohol use and to follow up for his cirrhosis. On physical exam he has no evidence of ascites. Liver margin 6 cm below the costal margin. No angiomas, muscle wasting, clubbing, palmar erythema present. CBC with improved thrombocytopenia. AST/ALT remain elevated. Will continue to work on alcohol use disorder and refer back to GI. Will also order RUQ [place] with elastography. discussed sharing appointment times with his fiance and mother to help him make his appointments.   -new referral to GI placed -no evidence of decompensatory state -RUQ [place] with elastography -see alcohol use disorder tab  Addendum:  AFP normal. Decreased bicarb and elevated anion gap, suspect alcoholic ketoacidosis. Glucose wnl and not on SGLT-2. Declining infectious symptoms and no evidence on exam. Will follow up for resolution at follow up in 2-4 weeks.

## 2022-01-27 NOTE — Assessment & Plan Note (Signed)
BP of 137/81, above goal of under 130/80. Patient uncertain if he is taking blood pressure medications. He was started on olmesartan last November but does not remember this. Discussed seeing if he has this medicine at home, it was last filled a month ago.   -continue olmesartan, if above goal consider combination pill with HCTZ/amldoipine to decrease pill burden.

## 2022-01-27 NOTE — Addendum Note (Signed)
Addended by: [name] on: 01/27/2022 07:13 AM   Modules accepted: Orders

## 2022-01-27 NOTE — Assessment & Plan Note (Addendum)
Hx of chest x ray with right lobe opacity on two previous xrays. He has a 38 pack year history. CT chest of the lung for screening was ordered however patient never called back for appointment. Will place new order with episodes of night sweats, weight loss.   -new order placed for CT lungs

## 2022-01-27 NOTE — Assessment & Plan Note (Signed)
Referral placed to GI for cirrhosis follow up, also due for colon cancer screening.   -referral placed to GI
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
## 2022-01-27 NOTE — Assessment & Plan Note (Signed)
Has recently increased alcohol consumption to daily liquor and beer use. He is able to stop for 2-3 days, but at this time he becomes irritable and starts having worsening tremors. Was doing well with naltrexone and was able to go a few days without alcohol. He is interested in quitting and would like inpatient detox/rehab. We attempted outpatient taper with gabapentin however he was unable to be adherent to this. We will work with our inpatient team for this. He has a good support system in his fiance and mother. He states he gets scared whenever we discuss his cirrhosis and liver disease.   Referral to IBH placed to help with this. We discussed not stopping abruptly as this may precipitate withdrawl. He is also displaying significant memory deficits, however, unclear if this is because he is actively intoxicated or early signs of memory deficits from his long standing alcohol use. No evidence of nystagmus and not ataxic with ambulation, do not suspect Wernicke's/Korsakoff. Do suspect it is contributing to his thrombocytopenia and the reason for his anion gap metabolic acidosis.   -referral to IBH placed -continue to monitor alcohol use

## 2022-01-27 NOTE — Assessment & Plan Note (Signed)
Endorses night sweats for the last month. Weight of 148lbs today from 150'slbs. Was in the upper 140 lbs last year. He states he has new bowel incontinence, however, this was noted 2 years ago per our charts and has no focal neurological deficits. Will continue cancer screening with colonoscopy and CT chest with his history of tobacco use disorder.

## 2022-01-27 NOTE — Assessment & Plan Note (Addendum)
Patient endorses having to wear diapers as he is unable to hold his stools and make it to the bathroom in time, he states this is a new concern. On chart review, it appears he had this concern in 06/2020. He was evaluated by neurosurgery and MRI was ordered for concern of spinal stenosis. MRI without evidence of stenosis. He had an EMG 04/2019 with evidence of mild axonal sensorimotor polyneuropathy and mild bilateral lumbosacral radiculopathy of L4-5/S1 myotomes.   Neurological exam is intact, with strength limited bilateral lower extremites with knee flexion. Patellar reflexes intact. Gait intact. Rectal examination performed, external examination negative for hemorrhoids, warts, or fissures. Anal reflex negative with q-tip however on digital exam, patient with appropriate tone. Smooth prostate. With patient stating this is new and having it in prior documentation, I suspect he is continuing to have worsening memory from his alcohol use disorder. However with his weight loss and night sweats, we will continue his cancer surveillance with chest CT and colonoscopy. He denies back pain, bladder incontinence, Hx of malignancy.   -follow up GI for colon cancer screening -follow up chest CT -if worsening symptoms, consider following with neurology

## 2022-01-27 NOTE — Assessment & Plan Note (Signed)
Continue cymbalta 60 mg daily. Consider IBH as well to assist with depression and continued alcohol use

## 2022-01-27 NOTE — Assessment & Plan Note (Signed)
Patient with persistent thrombocytopenia. Prior blood smear with evidence of low platelets, no other abnormalities. This is thought to be secondary to his alcohol use and cirrhosis. He is not pancytopenic nor do I believe he has any other indication for heme/onc referral. No evidence of spontaneous bleed.  -continue to work with patient on alcohol use disorder and cirrhosis.  -trend CBC every 6 months to a year

## 2022-01-30 NOTE — Progress Notes (Signed)
Internal Medicine Clinic Attending  Case discussed with Dr. [name]  At the time of the visit.  We reviewed the resident's history and exam and pertinent patient test results.  I agree with the assessment, diagnosis, and plan of care documented in the resident's note.  

## 2022-02-06 ENCOUNTER — Other Ambulatory Visit: Payer: Self-pay | Admitting: Student

## 2022-02-06 DIAGNOSIS — F1020 Alcohol dependence, uncomplicated: Secondary | ICD-10-CM

## 2022-02-06 DIAGNOSIS — K7030 Alcoholic cirrhosis of liver without ascites: Secondary | ICD-10-CM

## 2022-02-10 ENCOUNTER — Ambulatory Visit (HOSPITAL_COMMUNITY)
Admission: AD | Admit: 2022-02-10 | Discharge: 2022-02-10 | Disposition: A | Discharge status: Home / Self Care | Payer: Medicare Other | Attending: Psychiatry | Admitting: Psychiatry

## 2022-02-10 ENCOUNTER — Emergency Department (HOSPITAL_COMMUNITY)
Admission: EM | Admit: 2022-02-10 | Discharge: 2022-02-11 | Disposition: A | Discharge status: Home / Self Care | Payer: Medicare Other | Attending: Emergency Medicine | Admitting: Emergency Medicine

## 2022-02-10 ENCOUNTER — Other Ambulatory Visit: Payer: Self-pay

## 2022-02-10 DIAGNOSIS — Z20822 Contact with and (suspected) exposure to covid-19: Secondary | ICD-10-CM | POA: Diagnosis not present

## 2022-02-10 DIAGNOSIS — Y902 Blood alcohol level of 40-59 mg/100 ml: Secondary | ICD-10-CM | POA: Diagnosis not present

## 2022-02-10 DIAGNOSIS — I10 Essential (primary) hypertension: Secondary | ICD-10-CM | POA: Diagnosis not present

## 2022-02-10 DIAGNOSIS — R1900 Intra-abdominal and pelvic swelling, mass and lump, unspecified site: Secondary | ICD-10-CM | POA: Diagnosis not present

## 2022-02-10 DIAGNOSIS — K7031 Alcoholic cirrhosis of liver with ascites: Secondary | ICD-10-CM | POA: Insufficient documentation

## 2022-02-10 DIAGNOSIS — F1020 Alcohol dependence, uncomplicated: Secondary | ICD-10-CM | POA: Diagnosis present

## 2022-02-10 DIAGNOSIS — I119 Hypertensive heart disease without heart failure: Secondary | ICD-10-CM | POA: Diagnosis not present

## 2022-02-10 DIAGNOSIS — I493 Ventricular premature depolarization: Secondary | ICD-10-CM | POA: Insufficient documentation

## 2022-02-10 DIAGNOSIS — Z9151 Personal history of suicidal behavior: Secondary | ICD-10-CM | POA: Diagnosis not present

## 2022-02-10 DIAGNOSIS — Z79899 Other long term (current) drug therapy: Secondary | ICD-10-CM | POA: Insufficient documentation

## 2022-02-10 DIAGNOSIS — D696 Thrombocytopenia, unspecified: Secondary | ICD-10-CM | POA: Diagnosis not present

## 2022-02-10 DIAGNOSIS — Z022 Encounter for examination for admission to residential institution: Secondary | ICD-10-CM | POA: Insufficient documentation

## 2022-02-10 LAB — CBC
HCT: 42.7 % (ref 39.0–52.0)
Hemoglobin: 14.2 g/dL (ref 13.0–17.0)
MCH: 35 pg — ABNORMAL HIGH (ref 26.0–34.0)
MCHC: 33.3 g/dL (ref 30.0–36.0)
MCV: 105.2 fL — ABNORMAL HIGH (ref 80.0–100.0)
Platelets: 77 10*3/uL — ABNORMAL LOW (ref 150–400)
RBC: 4.06 MIL/uL — ABNORMAL LOW (ref 4.22–5.81)
RDW: 13.2 % (ref 11.5–15.5)
WBC: 4.4 10*3/uL (ref 4.0–10.5)
nRBC: 0 % (ref 0.0–0.2)

## 2022-02-10 LAB — BASIC METABOLIC PANEL
Anion gap: 9 (ref 5–15)
BUN: 5 mg/dL — ABNORMAL LOW (ref 6–20)
CO2: 25 mmol/L (ref 22–32)
Calcium: 9.4 mg/dL (ref 8.9–10.3)
Chloride: 105 mmol/L (ref 98–111)
Creatinine, Ser: 0.71 mg/dL (ref 0.61–1.24)
GFR, Estimated: >60 mL/min (ref >60)
Glucose, Bld: 98 mg/dL (ref 70–99)
Potassium: 4 mmol/L (ref 3.5–5.1)
Sodium: 139 mmol/L (ref 135–145)

## 2022-02-10 LAB — RAPID URINE DRUG SCREEN, HOSP PERFORMED
Amphetamines: NOT DETECTED
Barbiturates: NOT DETECTED
Benzodiazepines: NOT DETECTED
Cocaine: POSITIVE — AB
Opiates: NOT DETECTED
Tetrahydrocannabinol: NOT DETECTED

## 2022-02-10 LAB — ETHANOL: Alcohol, Ethyl (B): 44 mg/dL — ABNORMAL HIGH (ref <10)

## 2022-02-10 NOTE — ED Triage Notes (Signed)
Pt here via [place] from [facility] [place]. Pt checked himself in for detox around 3pm today, last drink at approx 10am today. [place] is concerned for DT's, wants pt to be medically cleared before returning. Pt has no complaints, 140/82, 86HR, 98%, cbg 73 - EMS gave pt snacks.
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
## 2022-02-10 NOTE — ED Provider Triage Note (Addendum)
Emergency Medicine Provider Triage Evaluation Note  [name]. , a 56 y.o. male  was evaluated in triage.  Pt complains of needing detox.  Patient states that he attempted to go to behavioral health urgent care earlier today requesting detox and was told that he needed to come to the ED for medical clearance.  Patient states he is drinking malt liquor for the past 2 and half months and before that had been drinking brandy heavily for the last 10 years.  The patient denies any history of delirium tremens.  Patient states that he just wishes to stop drinking now because it is causing issues with his fiance.  The patient denies any irritability, anxiousness, tremors, chest pain, shortness of breath, nausea, vomiting, diarrhea, abdominal pain, lightheadedness, dizziness, weakness.  Patient states last drink was 10 AM this morning.  Review of Systems  Positive:  Negative:   Physical Exam  BP (!) 155/87 (BP Location: Right Arm)   Pulse 90   Temp 98.5 F (36.9 C) (Oral)   Resp 18   SpO2 97%  Gen:   Awake, no distress   Resp:  Normal effort  MSK:   Moves extremities without difficulty  Other:    Medical Decision Making  Medically screening exam initiated at 6:57 PM.  Appropriate orders placed.  [name]. was informed that the remainder of the evaluation will be completed by another provider, this initial triage assessment does not replace that evaluation, and the importance of remaining in the ED until their evaluation is complete.         Azucena Cecil, PA-C 02/10/22 1859

## 2022-02-11 ENCOUNTER — Other Ambulatory Visit (INDEPENDENT_AMBULATORY_CARE_PROVIDER_SITE_OTHER)
Admission: EM | Admit: 2022-02-11 | Discharge: 2022-02-17 | Disposition: A | Discharge status: Home / Self Care | Payer: Medicare Other | Source: Home / Self Care | Admitting: Student in an Organized Health Care Education/Training Program

## 2022-02-11 DIAGNOSIS — Z9151 Personal history of suicidal behavior: Secondary | ICD-10-CM | POA: Insufficient documentation

## 2022-02-11 DIAGNOSIS — R1900 Intra-abdominal and pelvic swelling, mass and lump, unspecified site: Secondary | ICD-10-CM | POA: Insufficient documentation

## 2022-02-11 DIAGNOSIS — F1020 Alcohol dependence, uncomplicated: Secondary | ICD-10-CM | POA: Diagnosis not present

## 2022-02-11 DIAGNOSIS — D696 Thrombocytopenia, unspecified: Secondary | ICD-10-CM | POA: Insufficient documentation

## 2022-02-11 DIAGNOSIS — F1010 Alcohol abuse, uncomplicated: Secondary | ICD-10-CM | POA: Diagnosis present

## 2022-02-11 DIAGNOSIS — Z20822 Contact with and (suspected) exposure to covid-19: Secondary | ICD-10-CM | POA: Insufficient documentation

## 2022-02-11 DIAGNOSIS — I119 Hypertensive heart disease without heart failure: Secondary | ICD-10-CM | POA: Insufficient documentation

## 2022-02-11 DIAGNOSIS — I493 Ventricular premature depolarization: Secondary | ICD-10-CM | POA: Insufficient documentation

## 2022-02-11 DIAGNOSIS — Z79899 Other long term (current) drug therapy: Secondary | ICD-10-CM | POA: Insufficient documentation

## 2022-02-11 DIAGNOSIS — Z022 Encounter for examination for admission to residential institution: Secondary | ICD-10-CM | POA: Insufficient documentation

## 2022-02-11 DIAGNOSIS — K7031 Alcoholic cirrhosis of liver with ascites: Secondary | ICD-10-CM | POA: Insufficient documentation

## 2022-02-11 DIAGNOSIS — M79605 Pain in left leg: Secondary | ICD-10-CM

## 2022-02-11 LAB — URINALYSIS, ROUTINE W REFLEX MICROSCOPIC
Bilirubin Urine: NEGATIVE
Glucose, UA: 150 mg/dL — AB
Hgb urine dipstick: NEGATIVE
Ketones, ur: 5 mg/dL — AB
Leukocytes,Ua: NEGATIVE
Nitrite: NEGATIVE
Protein, ur: NEGATIVE mg/dL
Specific Gravity, Urine: 1.028 (ref 1.005–1.030)
pH: 5 (ref 5.0–8.0)

## 2022-02-11 LAB — LIPID PANEL
Cholesterol: 138 mg/dL (ref 0–200)
HDL: 46 mg/dL (ref >40)
LDL Cholesterol: 75 mg/dL (ref 0–99)
Total CHOL/HDL Ratio: 3 RATIO
Triglycerides: 85 mg/dL (ref <150)
VLDL: 17 mg/dL (ref 0–40)

## 2022-02-11 LAB — COMPREHENSIVE METABOLIC PANEL
ALT: 37 U/L (ref 0–44)
AST: 59 U/L — ABNORMAL HIGH (ref 15–41)
Albumin: 3.8 g/dL (ref 3.5–5.0)
Alkaline Phosphatase: 124 U/L (ref 38–126)
Anion gap: 9 (ref 5–15)
BUN: 6 mg/dL (ref 6–20)
CO2: 26 mmol/L (ref 22–32)
Calcium: 9.5 mg/dL (ref 8.9–10.3)
Chloride: 104 mmol/L (ref 98–111)
Creatinine, Ser: 0.8 mg/dL (ref 0.61–1.24)
GFR, Estimated: >60 mL/min (ref >60)
Glucose, Bld: 217 mg/dL — ABNORMAL HIGH (ref 70–99)
Potassium: 3.1 mmol/L — ABNORMAL LOW (ref 3.5–5.1)
Sodium: 139 mmol/L (ref 135–145)
Total Bilirubin: 1.1 mg/dL (ref 0.3–1.2)
Total Protein: 7.6 g/dL (ref 6.5–8.1)

## 2022-02-11 LAB — RESP PANEL BY RT-PCR (FLU A&B, COVID) ARPGX2
Influenza A by PCR: NEGATIVE
Influenza B by PCR: NEGATIVE
SARS Coronavirus 2 by RT PCR: NEGATIVE

## 2022-02-11 LAB — HEMOGLOBIN A1C
Hgb A1c MFr Bld: 4.5 % — ABNORMAL LOW (ref 4.8–5.6)
Mean Plasma Glucose: 82.45 mg/dL

## 2022-02-11 LAB — TROPONIN I (HIGH SENSITIVITY): Troponin I (High Sensitivity): 22 ng/L — ABNORMAL HIGH (ref <18)

## 2022-02-11 LAB — TSH: TSH: 1.558 u[IU]/mL (ref 0.350–4.500)

## 2022-02-11 MED ORDER — LORAZEPAM 1 MG PO TABS
1.0000 mg | ORAL_TABLET | Freq: Four times a day (QID) | ORAL | Status: AC
  Administered 2022-02-11 (×3): 1 mg via ORAL
  Filled 2022-02-11 (×3): qty 1

## 2022-02-11 MED ORDER — CHLORDIAZEPOXIDE HCL 25 MG PO CAPS: ORAL_CAPSULE | ORAL | 0 refills | Status: DC

## 2022-02-11 MED ORDER — ATORVASTATIN CALCIUM 40 MG PO TABS: 40.0000 mg | ORAL_TABLET | Freq: Every morning | ORAL | Status: DC

## 2022-02-11 MED ORDER — THIAMINE HCL 100 MG/ML IJ SOLN
100.0000 mg | Freq: Once | INTRAMUSCULAR | Status: AC
  Administered 2022-02-11: 100 mg via INTRAMUSCULAR
  Filled 2022-02-11: qty 2

## 2022-02-11 MED ORDER — ATORVASTATIN CALCIUM 40 MG PO TABS
40.0000 mg | ORAL_TABLET | Freq: Every morning | ORAL | Status: DC
  Administered 2022-02-11 – 2022-02-17 (×7): 40 mg via ORAL
  Filled 2022-02-11 (×6): qty 1
  Filled 2022-02-11: qty 7
  Filled 2022-02-11: qty 1

## 2022-02-11 MED ORDER — HYDROXYZINE HCL 25 MG PO TABS: 25.0000 mg | ORAL_TABLET | Freq: Three times a day (TID) | ORAL | Status: DC | PRN

## 2022-02-11 MED ORDER — ADULT MULTIVITAMIN W/MINERALS CH: 1.0000 | ORAL_TABLET | Freq: Every day | ORAL | Status: DC

## 2022-02-11 MED ORDER — IRBESARTAN 75 MG PO TABS: 75.0000 mg | ORAL_TABLET | Freq: Every day | ORAL | Status: DC

## 2022-02-11 MED ORDER — LORAZEPAM 1 MG PO TABS: 1.0000 mg | ORAL_TABLET | Freq: Four times a day (QID) | ORAL | Status: DC | PRN

## 2022-02-11 MED ORDER — LORAZEPAM 1 MG PO TABS
1.0000 mg | ORAL_TABLET | Freq: Two times a day (BID) | ORAL | Status: AC
  Administered 2022-02-13 (×2): 1 mg via ORAL
  Filled 2022-02-11 (×2): qty 1

## 2022-02-11 MED ORDER — ALUM & MAG HYDROXIDE-SIMETH 200-200-20 MG/5ML PO SUSP: 30.0000 mL | ORAL | Status: DC | PRN

## 2022-02-11 MED ORDER — THIAMINE HCL 100 MG/ML IJ SOLN: 100.0000 mg | Freq: Once | INTRAMUSCULAR | Status: DC

## 2022-02-11 MED ORDER — NICOTINE 21 MG/24HR TD PT24
21.0000 mg | MEDICATED_PATCH | Freq: Every day | TRANSDERMAL | Status: DC
  Administered 2022-02-12 – 2022-02-17 (×6): 21 mg via TRANSDERMAL
  Filled 2022-02-11 (×4): qty 1
  Filled 2022-02-11: qty 7
  Filled 2022-02-11 (×2): qty 1

## 2022-02-11 MED ORDER — LORAZEPAM 1 MG PO TABS
1.0000 mg | ORAL_TABLET | Freq: Three times a day (TID) | ORAL | Status: AC
  Administered 2022-02-12 (×3): 1 mg via ORAL
  Filled 2022-02-11 (×3): qty 1

## 2022-02-11 MED ORDER — MAGNESIUM HYDROXIDE 400 MG/5ML PO SUSP: 30.0000 mL | Freq: Every day | ORAL | Status: DC | PRN

## 2022-02-11 MED ORDER — TRAZODONE HCL 50 MG PO TABS: 50.0000 mg | ORAL_TABLET | Freq: Every evening | ORAL | Status: DC | PRN

## 2022-02-11 MED ORDER — DULOXETINE HCL 60 MG PO CPEP
60.0000 mg | ORAL_CAPSULE | Freq: Every day | ORAL | Status: DC
Start: 2022-02-11 — End: 2022-02-11

## 2022-02-11 MED ORDER — IRBESARTAN 75 MG PO TABS
75.0000 mg | ORAL_TABLET | Freq: Every day | ORAL | Status: DC
  Administered 2022-02-11 – 2022-02-13 (×3): 75 mg via ORAL
  Filled 2022-02-11 (×3): qty 1

## 2022-02-11 MED ORDER — LORAZEPAM 1 MG PO TABS
1.0000 mg | ORAL_TABLET | Freq: Every day | ORAL | Status: AC
  Administered 2022-02-14: 1 mg via ORAL
  Filled 2022-02-11: qty 1

## 2022-02-11 MED ORDER — ADULT MULTIVITAMIN W/MINERALS CH
1.0000 | ORAL_TABLET | Freq: Every day | ORAL | Status: DC
  Administered 2022-02-11 – 2022-02-17 (×7): 1 via ORAL
  Filled 2022-02-11 (×7): qty 1

## 2022-02-11 MED ORDER — POTASSIUM CHLORIDE CRYS ER 20 MEQ PO TBCR
40.0000 meq | EXTENDED_RELEASE_TABLET | Freq: Once | ORAL | Status: AC
  Administered 2022-02-11: 40 meq via ORAL
  Filled 2022-02-11: qty 2

## 2022-02-11 MED ORDER — DULOXETINE HCL 60 MG PO CPEP
60.0000 mg | ORAL_CAPSULE | Freq: Every day | ORAL | Status: DC
  Administered 2022-02-11 – 2022-02-17 (×7): 60 mg via ORAL
  Filled 2022-02-11: qty 7
  Filled 2022-02-11 (×7): qty 1

## 2022-02-11 MED ORDER — LOPERAMIDE HCL 2 MG PO CAPS: 2.0000 mg | ORAL_CAPSULE | ORAL | Status: DC | PRN

## 2022-02-11 MED ORDER — NICOTINE 21 MG/24HR TD PT24
21.0000 mg | MEDICATED_PATCH | Freq: Every day | TRANSDERMAL | Status: DC
  Administered 2022-02-11: 21 mg via TRANSDERMAL
  Filled 2022-02-11: qty 1

## 2022-02-11 MED ORDER — ONDANSETRON 4 MG PO TBDP
4.0000 mg | ORAL_TABLET | Freq: Four times a day (QID) | ORAL | Status: DC | PRN
Start: 2022-02-11 — End: 2022-02-14

## 2022-02-11 MED ORDER — ACETAMINOPHEN 325 MG PO TABS: 650.0000 mg | ORAL_TABLET | Freq: Four times a day (QID) | ORAL | Status: DC | PRN

## 2022-02-11 MED ORDER — THIAMINE HCL 100 MG PO TABS
100.0000 mg | ORAL_TABLET | Freq: Every day | ORAL | Status: DC
Start: 2022-02-12 — End: 2022-02-17
  Administered 2022-02-12 – 2022-02-17 (×6): 100 mg via ORAL
  Filled 2022-02-11 (×6): qty 1

## 2022-02-11 NOTE — ED Notes (Signed)
Sent a secure chat to provider asking if she wants a repeat troponin level

## 2022-02-11 NOTE — Progress Notes (Signed)
Patient did not notify staff that he has bowel incontinence and uses diapers.  He stated he tried to make it to the bathroom however did not get there in time and had a bowel movement.  Patient given toiletries, clean scrubs and assisted to the bathroom to shower.  RN also went to his property with his permission and got him his diapers, change of underwear, bathrobe without strings, and shower shoes which he has with him now.  Patient is showering.  Housekeeping informed of need to clean bathroom floor and his soiled clothing have been put in the washing machine for cleaning.

## 2022-02-11 NOTE — ED Provider Notes (Addendum)
[facility] EMERGENCY DEPARTMENT Provider Note   CSN: [id number] Arrival date & time: 02/10/22  1837     History  Chief Complaint  Patient presents with   Alcohol Problem    [name]. is a 56 y.o. male. With past medical history of hypertension, alcohol use disorder with alcoholic cirrhosis, GERD who presents to the emergency department requesting detox.   States he went to [facility] yesterday requesting alcohol detox and was instructed to come to the emergency department for medical clearance. States that he is currently drinking "2 40oz malt liquors" a day over the past 1 month. States that prior to that he was drinking 12-15 beers daily for >5 years. Last drink 10am yesterday (02/10/22). He is currently denying any withdrawal symptoms, but states he does get symptoms if he does not drink for some time. He denies anxiety, headache, diaphoresis, tremors, hallucinations, nausea, chest pain, shortness of breath, abdominal pain or diarrhea. He has never been admitted for alcohol use or withdrawal. No history of withdrawal seizures or DT's. Denies SI/HI/AVH.    Alcohol Problem       Home Medications Prior to Admission medications   Medication Sig Start Date End Date Taking? Authorizing Provider  atorvastatin (LIPITOR) 40 MG tablet TAKE ONE TABLET BY MOUTH EVERY MORNING 08/23/21   [name], [name], MD  gabapentin (NEURONTIN) 300 MG capsule TAKE TWO CAPSULES BY MOUTH THREE times daily 10/13/21   [name], MD  olmesartan (BENICAR) 5 MG tablet TAKE TWO TABLETS BY MOUTH EVERY MORNING 01/16/22   [name], MD  aspirin-acetaminophen-caffeine (EXCEDRIN MIGRAINE) [phone] MG tablet Take 1 tablet by mouth every 6 (six) hours as needed for headache or migraine. Patient not taking: No sig reported 11/18/20   [name], MD  Blood Pressure Monitoring (BLOOD PRESSURE CUFF) MISC Please check blood pressure twice a week and record. Take BP in  calmed, relaxed setting. Both feet on the ground. Arm at level of chest. 03/09/21   [name], MD  cyclobenzaprine (FLEXERIL) 5 MG tablet Take 1 tablet (5 mg total) by mouth 3 (three) times daily as needed for muscle spasms. 03/09/21   [name], [name], MD  DULoxetine (CYMBALTA) 60 MG capsule Take 1 capsule (60 mg total) by mouth daily. 01/27/22   [name], MD  ergocalciferol (VITAMIN D2) 1.25 MG (50000 UT) capsule Take 1 capsule (50,000 Units total) by mouth once a week. 10/25/20   [name], MD  Multiple Vitamin (MULTIVITAMIN ADULT PO) Take 2 tablets by mouth daily.    [provider]  naltrexone (DEPADE) 50 MG tablet Take 1 tablet (50 mg total) by mouth daily. 09/06/21   [name], [name], MD  sildenafil (VIAGRA) 50 MG tablet TAKE 1/2 TABLET BY MOUTH AS NEEDED FOR erectile dysfunction ONE HOUR BEFORE sexual activity 01/23/22   [name], [name], MD  tetrahydrozoline-zinc (VISINE-AC) 0.05-0.25 % ophthalmic solution Place 1 drop into both eyes 3 (three) times daily as needed (dry eyes).    [provider]  thiamine 100 MG tablet Take 1 tablet (100 mg total) by mouth daily. Patient not taking: No sig reported 10/25/20   [name], MD      Allergies    Patient has no known allergies.    Review of Systems   Review of Systems  Psychiatric/Behavioral:  Positive for behavioral problems.   All other systems reviewed and are negative.   Physical Exam Updated Vital Signs BP (!) 149/92   Pulse 91   Temp 98.2  F (36.8 C) (Oral)   Resp 16   SpO2 98%  Physical Exam Vitals and nursing note reviewed.  Constitutional:      General: He is not in acute distress.    Appearance: Normal appearance. He is normal weight. He is not ill-appearing or toxic-appearing.  HENT:     Head: Normocephalic and atraumatic.     Nose: Nose normal.     Mouth/Throat:     Mouth: Mucous membranes are moist.     Pharynx: Oropharynx is clear.  Eyes:     General: No  scleral icterus.    Extraocular Movements: Extraocular movements intact.     Right eye: No nystagmus.     Left eye: No nystagmus.     Conjunctiva/sclera:     Right eye: Right conjunctiva is injected.     Left eye: Left conjunctiva is injected.     Pupils: Pupils are equal, round, and reactive to light.  Cardiovascular:     Rate and Rhythm: Normal rate and regular rhythm.     Pulses: Normal pulses.     Heart sounds: No murmur heard. Pulmonary:     Effort: Pulmonary effort is normal. No respiratory distress.  Abdominal:     General: Abdomen is protuberant. Bowel sounds are normal. There is no distension.     Palpations: Abdomen is soft. There is no fluid wave.     Tenderness: There is no abdominal tenderness. There is no guarding.  Musculoskeletal:        General: Normal range of motion.     Cervical back: Neck supple.  Skin:    General: Skin is warm and dry.     Capillary Refill: Capillary refill takes less than 2 seconds.     Coloration: Skin is not jaundiced.  Neurological:     General: No focal deficit present.     Mental Status: He is alert and oriented to person, place, and time. Mental status is at baseline.     Motor: Motor function is intact. No tremor.     Gait: Gait normal.  Psychiatric:        Mood and Affect: Mood normal.        Behavior: Behavior normal.        Thought Content: Thought content normal.        Judgment: Judgment normal.    ED Results / Procedures / Treatments   Labs (all labs ordered are listed, but only abnormal results are displayed) Labs Reviewed  ETHANOL - Abnormal; Notable for the following components:      Result Value   Alcohol, Ethyl (B) 44 (*)    All other components within normal limits  CBC - Abnormal; Notable for the following components:   RBC 4.06 (*)    MCV 105.2 (*)    MCH 35.0 (*)    Platelets 77 (*)    All other components within normal limits  BASIC METABOLIC PANEL - Abnormal; Notable for the following components:   BUN  5 (*)    All other components within normal limits  RAPID URINE DRUG SCREEN, HOSP PERFORMED - Abnormal; Notable for the following components:   Cocaine POSITIVE (*)    All other components within normal limits   EKG None  Radiology No results found.  Procedures Procedures   Medications Ordered in ED Medications - No data to display  ED Course/ Medical Decision Making/ A&P  Medical Decision Making Amount and/or Complexity of Data Reviewed Labs: ordered.  Risk Prescription drug management.  This patient presents to the ED with chief complaint(s) of alcohol use with pertinent past medical history of HTN, alcohol use disorder, cirrhosis, GERD which further complicates the presenting complaint. The complaint involves an extensive differential diagnosis and also carries with it a high risk of complications and morbidity.    The differential diagnosis includes intoxication, coingestion, withdrawal, dts, etc.   Additional history obtained: Additional history obtained from  None Records reviewed Care Everywhere/External Records  ED Course and Reassessment:  56 year old male who presents to the emergency department at the instruction of [facility] for medical clearance for alcohol withdrawal. He presented to [facility] for detox.  Currently, well appearing, not intoxicated, alert and oriented. He is currently having no symptoms of withdrawal. CIWA score of 1.  Physical exam is unremarkable.  His labs are consistent with known alcohol use but no acute, concerning findings at this time.  Feel that he is appropriate for discharge to [facility] for ongoing treatment.   I spoke with Quintella Reichert, NP at the [facility] to help coordinate patient treatment. She states there appears to be availability at [facility] and believes patient would be better suited at the [facility] for alcohol detox as they have longer programs. She states she will call over to [facility] to let them  know the patient is being discharged there. Additionally, I have spoken with Ricky Ala, [facility] APP who has accepted patient at [facility] and informed Dr. Kai Levins. I have asked secretary to coordinate safe transport for patient to [facility]. Provided him with a prescription for Librium taper if for some reason he is unable to be seen at [facility].  Patient is agreeable to plan.   Independent labs interpretation:  The following labs were independently interpreted:  CBC with macrocytosis thrombocytopenia consistent with chronic alcohol use UDS + cocaine  BMP within normal limits   Independent visualization of imaging: - I independently visualized the following imaging with scope of interpretation limited to determining acute life threatening conditions related to emergency care: n/a, which revealed n/a  Consultation: - Consulted or discussed management/test interpretation w/ external professional: n/a  Consideration for admission or further workup: does not need admission or further work up at this time Social Determinants of health: alcohol use disorder - given resources  Final Clinical Impression(s) / ED Diagnoses Final diagnoses:  Uncomplicated alcohol dependence ([place])    Rx / DC Orders ED Discharge Orders          Ordered    chlordiazePOXIDE (LIBRIUM) 25 MG capsule        02/11/22 0751              Mickie Hillier, PA-C 02/11/22 0800    Mickie Hillier, PA-C 02/11/22 0815    Charlesetta Shanks, MD 02/17/22 1735

## 2022-02-11 NOTE — ED Notes (Signed)
Provider came to assess the patient after an abnormal  EKG result note earlier in the day - patient denying any CP or SOB at the moment.  Troponin taken as ordered - instructed patient to let us know asap if he starts feeling worse or develops CP and SOB - will continue to monitor

## 2022-02-11 NOTE — ED Notes (Signed)
Patient admitted to [facility] from [facility]  Patient admitted for alcohol use disorder.  He was started on ativan taper and CIWA.  Patient was needy and entitled upon arrival.  He wanted to go through all of his belongings prior to coming onto the unit and required firm redirection to follow staff direction.  Patient wanted to bring things that were not approved onto unit.  I.e. spiral notebook, hardcover book, sandwich and drink from outside.  RN explained to patient these items were not allowed onto unit and he eventually complied with staff.  Patient was cooperative but hesitant with admission process and was not happy when he learned this is a 3 to 5 day program as he wanted a longer place to stay.  Writer explained to him that he will work with team to secure longer treatment if that is what he wants.  Patient shown around the unit and to his room.  Offered food but refused it.  Met with M.D.  Patient focused on medication for etoh withdrawal  (librium) which was prescribed for him at [facility].  Patient was informed that M.D. here would order appropriate medication for him and he would receive it once cleared by pharmacy.  He accepted this explanation. Shortly afterward patient again voiced his displeasure with not having his things.  He was reassured that Probation officer would go with him to gather what he wanted from his belongings as long as it was appropriate for unit.  He accepted this.  Patient CIWA= 0 at this time.  No evidence of etoh withdrawal.  Patient does have soft clothe brace supports on both knees and uses a cane at home.  He was informed a cane would not be possible to use on [facility]BC.  He is able to ambulate well and was encouraged to call staff if he felt unsteady or weak.  Patient was also not happy with his room asking writer " what, no amenities?"  Patient encouraged to make himself comfortable in the surroundings provided.  Will monitor and provide safe supportive environment and redirection/education as needed.

## 2022-02-11 NOTE — ED Notes (Signed)
Pt is in the bed sleeping. Respirations are even and unlabored. No acute distress noted. Will continue to monitor for safety. 

## 2022-02-11 NOTE — ED Notes (Signed)
[employer]
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
## 2022-02-11 NOTE — Progress Notes (Signed)
Patient was cooperative with PCR and blood work.  He has urine cup and was instructed to complete that and return cup to RN as soon as he is able.  Patient is pleasant and organized.  He is presently sitting in day room with peers watching TV.  Encouraged him to seek out staff to have needs met.  Patient without withdrawal symptoms and CIWA is 0.  Will continue to monitor.

## 2022-02-11 NOTE — ED Notes (Signed)
Provider to unit. Gave her VS results.  Told her that when he walked to his room earlier, he appeared to be slightly SOB.  Provider wants him monitored through the night here

## 2022-02-11 NOTE — ED Notes (Signed)
Patient participating well in groups. Patient is cooperative and interacts well with staff.  Respiratory is even and unlabored. No distress noted.  will continue to monitor for safety.

## 2022-02-11 NOTE — Discharge Instructions (Addendum)
Please go to the [facility] Health Urgent Care at the address listed on your discharge paperwork for detox at the [facility].   If for any reason you are unable to be seen, I have provided you with a prescription for Librium which is a tapered medication for alcohol detox.   Please return for worsening withdrawal symptoms as this can be life threatening.
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
## 2022-02-11 NOTE — ED Notes (Signed)
Provider made aware of troponin result - states she will be here on unit soon

## 2022-02-12 DIAGNOSIS — F1010 Alcohol abuse, uncomplicated: Secondary | ICD-10-CM | POA: Diagnosis not present

## 2022-02-12 DIAGNOSIS — F1020 Alcohol dependence, uncomplicated: Secondary | ICD-10-CM | POA: Diagnosis not present

## 2022-02-12 LAB — PROTIME-INR
INR: 1.2 (ref 0.8–1.2)
Prothrombin Time: 15.2 seconds (ref 11.4–15.2)

## 2022-02-12 LAB — TROPONIN I (HIGH SENSITIVITY): Troponin I (High Sensitivity): 17 ng/L (ref <18)

## 2022-02-12 NOTE — ED Notes (Signed)
Patient sleeping with no sxs of distress noted at this time - will continue to monitor for safety

## 2022-02-12 NOTE — ED Notes (Signed)
Care for pt, assumed.  Report received about elevated troponin and abn EKG.   Pt has order for repeat troponin level at 8am.  And daily EKG's. Provider has seen this morning's EKG and is aware of lab levels.     Pt is currently sleeping soundly .  Breathing even and unlabored.  Staff will continue to monitor every 15 min for safety.

## 2022-02-12 NOTE — ED Notes (Signed)
Pt sitting in dayroom watching tv.  He is interacting appropriately with peers with no distress noted.  Staff will continue q 15 min checks for safety.
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
## 2022-02-12 NOTE — ED Notes (Signed)
Pt is currently resting in room.  He denies SI, HI or AVH.  Pt states that he is " feeling better than this morning". Staff will continue to monitor for safety.

## 2022-02-12 NOTE — ED Notes (Signed)
Patient sleeping - no sxs of distress noted - will continue to monitor for safety 

## 2022-02-12 NOTE — ED Notes (Signed)
Pt was given lunch

## 2022-02-13 DIAGNOSIS — F1020 Alcohol dependence, uncomplicated: Secondary | ICD-10-CM | POA: Diagnosis not present

## 2022-02-13 MED ORDER — SPIRONOLACTONE 25 MG PO TABS
100.0000 mg | ORAL_TABLET | Freq: Every day | ORAL | Status: DC
  Administered 2022-02-13 – 2022-02-17 (×5): 100 mg via ORAL
  Filled 2022-02-13 (×2): qty 4
  Filled 2022-02-13: qty 28
  Filled 2022-02-13 (×4): qty 4

## 2022-02-13 MED ORDER — FUROSEMIDE 40 MG PO TABS
40.0000 mg | ORAL_TABLET | Freq: Every day | ORAL | Status: DC
  Administered 2022-02-13 – 2022-02-17 (×5): 40 mg via ORAL
  Filled 2022-02-13: qty 1
  Filled 2022-02-13: qty 7
  Filled 2022-02-13 (×4): qty 1

## 2022-02-13 MED ORDER — THIAMINE HCL 100 MG/ML IJ SOLN
500.0000 mg | Freq: Once | INTRAMUSCULAR | Status: AC
  Administered 2022-02-13: 500 mg via INTRAMUSCULAR
  Filled 2022-02-13: qty 6

## 2022-02-13 MED ORDER — GABAPENTIN 100 MG PO CAPS
100.0000 mg | ORAL_CAPSULE | Freq: Three times a day (TID) | ORAL | Status: DC
  Administered 2022-02-13 – 2022-02-15 (×9): 100 mg via ORAL
  Filled 2022-02-13 (×9): qty 1

## 2022-02-13 NOTE — Progress Notes (Signed)
Pt is presently watching TV and interacting with a peer. No distress noted or concerns voiced. Staff will monitor for pt's safety.
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
## 2022-02-13 NOTE — ED Notes (Signed)
Pt is in the bed sleeping. Respirations are even and unlabored. No acute distress noted. Will continue to monitor for safety. 

## 2022-02-13 NOTE — Progress Notes (Signed)
Pt is awake, alert and oriented. Pt did not voice any complaints of pain or discomfort. No distress noted. Administered scheduled meds with no issue. Pt denies current SI/HI/AVH. Staff will monitor for pt's safety.

## 2022-02-13 NOTE — ED Notes (Signed)
Patient sleeping with no sxs of distress noted - will continue to monitor for safety 
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
## 2022-02-13 NOTE — Progress Notes (Signed)
Pt's CIWA was 1. 

## 2022-02-13 NOTE — ED Notes (Signed)
EKG attempted with both machines in the building - neither registered with 2 MHTs assessing - provider notified of inability to obtain EKG

## 2022-02-14 DIAGNOSIS — F1020 Alcohol dependence, uncomplicated: Secondary | ICD-10-CM | POA: Diagnosis not present

## 2022-02-14 LAB — COMPREHENSIVE METABOLIC PANEL
ALT: 36 U/L (ref 0–44)
AST: 56 U/L — ABNORMAL HIGH (ref 15–41)
Albumin: 4 g/dL (ref 3.5–5.0)
Alkaline Phosphatase: 107 U/L (ref 38–126)
Anion gap: 8 (ref 5–15)
BUN: 8 mg/dL (ref 6–20)
CO2: 31 mmol/L (ref 22–32)
Calcium: 9.8 mg/dL (ref 8.9–10.3)
Chloride: 97 mmol/L — ABNORMAL LOW (ref 98–111)
Creatinine, Ser: 0.93 mg/dL (ref 0.61–1.24)
GFR, Estimated: >60 mL/min (ref >60)
Glucose, Bld: 90 mg/dL (ref 70–99)
Potassium: 3.8 mmol/L (ref 3.5–5.1)
Sodium: 136 mmol/L (ref 135–145)
Total Bilirubin: 0.9 mg/dL (ref 0.3–1.2)
Total Protein: 8.2 g/dL — ABNORMAL HIGH (ref 6.5–8.1)

## 2022-02-14 LAB — CBC
HCT: 45.1 % (ref 39.0–52.0)
Hemoglobin: 15.2 g/dL (ref 13.0–17.0)
MCH: 34.9 pg — ABNORMAL HIGH (ref 26.0–34.0)
MCHC: 33.7 g/dL (ref 30.0–36.0)
MCV: 103.7 fL — ABNORMAL HIGH (ref 80.0–100.0)
Platelets: 80 10*3/uL — ABNORMAL LOW (ref 150–400)
RBC: 4.35 MIL/uL (ref 4.22–5.81)
RDW: 12.8 % (ref 11.5–15.5)
WBC: 4.3 10*3/uL (ref 4.0–10.5)
nRBC: 0 % (ref 0.0–0.2)

## 2022-02-14 NOTE — ED Notes (Signed)
Patient awake and alert on unit.  Calm and cooperative with care.  Patient visible sitting and reading quietly.  He met with M.D.  No somatic complaints or evidence of withdrawal.  Will monitor and provide a safe supportive environment.

## 2022-02-14 NOTE — ED Notes (Signed)
Pt is in the dinning room watching TV with peers. Respirations are even and unlabored. No acute distress noted. Will continue to monitor for safety.

## 2022-02-14 NOTE — ED Notes (Signed)
Pt is in the bed sleeping. Respirations are even and unlabored. No acute distress noted. Will continue to monitor or safety.

## 2022-02-14 NOTE — Progress Notes (Signed)
Encouraged patient to attend a nursing group however he declined.

## 2022-02-14 NOTE — ED Notes (Signed)
Pt attending [facility] group meeting

## 2022-02-14 NOTE — Progress Notes (Signed)
Patient awake and alert on unit.  Sitting in dayroom watching tv and reading alternately.  Patient is calm and pleasant.  Organized and logical.  No evidence of withdrawal or somatic distress.  He appears motivated for treatment.  Will monitor and encourage him to seek staff to have needs met.

## 2022-02-14 NOTE — ED Notes (Signed)
Pt is in the bed sleeping. Respirations are even and unlabored. No acute distress noted. Will continue to monitor for safety. 
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
## 2022-02-15 ENCOUNTER — Ambulatory Visit (HOSPITAL_COMMUNITY): Payer: Medicare Other

## 2022-02-15 ENCOUNTER — Other Ambulatory Visit: Payer: Self-pay | Admitting: Student

## 2022-02-15 ENCOUNTER — Encounter (HOSPITAL_COMMUNITY): Payer: Self-pay | Admitting: Student in an Organized Health Care Education/Training Program

## 2022-02-15 ENCOUNTER — Ambulatory Visit (HOSPITAL_COMMUNITY): Admission: RE | Admit: 2022-02-15 | Payer: Medicare Other | Source: Ambulatory Visit

## 2022-02-15 ENCOUNTER — Encounter (HOSPITAL_COMMUNITY): Payer: Self-pay

## 2022-02-15 DIAGNOSIS — M79605 Pain in left leg: Secondary | ICD-10-CM

## 2022-02-15 DIAGNOSIS — F1020 Alcohol dependence, uncomplicated: Secondary | ICD-10-CM | POA: Diagnosis not present

## 2022-02-15 LAB — COMPREHENSIVE METABOLIC PANEL
ALT: 36 U/L (ref 0–44)
AST: 53 U/L — ABNORMAL HIGH (ref 15–41)
Albumin: 3.8 g/dL (ref 3.5–5.0)
Alkaline Phosphatase: 101 U/L (ref 38–126)
Anion gap: 10 (ref 5–15)
BUN: 9 mg/dL (ref 6–20)
CO2: 26 mmol/L (ref 22–32)
Calcium: 9.4 mg/dL (ref 8.9–10.3)
Chloride: 100 mmol/L (ref 98–111)
Creatinine, Ser: 0.76 mg/dL (ref 0.61–1.24)
GFR, Estimated: >60 mL/min (ref >60)
Glucose, Bld: 127 mg/dL — ABNORMAL HIGH (ref 70–99)
Potassium: 3.7 mmol/L (ref 3.5–5.1)
Sodium: 136 mmol/L (ref 135–145)
Total Bilirubin: 0.5 mg/dL (ref 0.3–1.2)
Total Protein: 7.6 g/dL (ref 6.5–8.1)

## 2022-02-15 LAB — CBC
HCT: 44.5 % (ref 39.0–52.0)
Hemoglobin: 15.1 g/dL (ref 13.0–17.0)
MCH: 34.8 pg — ABNORMAL HIGH (ref 26.0–34.0)
MCHC: 33.9 g/dL (ref 30.0–36.0)
MCV: 102.5 fL — ABNORMAL HIGH (ref 80.0–100.0)
Platelets: 75 10*3/uL — ABNORMAL LOW (ref 150–400)
RBC: 4.34 MIL/uL (ref 4.22–5.81)
RDW: 12.9 % (ref 11.5–15.5)
WBC: 4.3 10*3/uL (ref 4.0–10.5)
nRBC: 0 % (ref 0.0–0.2)
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
## 2022-02-15 NOTE — Clinical Social Work Psych Note (Addendum)
LCSW Update  [name] reports feeling "good" this morning. He denied having any physical complaints at this time. [name] denied having any SI, HI or AVH.   [name] reports he continues to want a "treatment bed", however he wants it to be in [place] or a surrounding area. LCSW explained that due to no bed availability, other options were being explored.   [name] continues to decline referrals to [place] [place] due to the facilities not being in the "[place] or surrounding areas". LCSW has contacted various residential treatment programs/facilities to determine bed availability however, there has been no bed availability at the following facilities:   [facility] [place]   LCSW informed [name] of CD-IOP services as a possible alternative to residential treatment at this time. LCSW explained that the program would be beneficial to his recovery process while he explores residential treatment services as they become available. [facility] shared that he would possibly be interested, however he is concerned about transportation services.   LCSW shared that transportation resources would be provided. [name] expressed understanding.   [name] denied having any additional questions or concerns at this time.   LCSW will continue to follow.    Radonna Ricker, MSW, LCSW Clinical Education officer, museum ([place]) [facility]

## 2022-02-15 NOTE — ED Notes (Signed)
Patient A&Ox4. Denies intent to harm self/others when asked. Denies A/VH. Patient denies any physical complaints when asked. No acute distress noted. Routine safety checks conducted according to facility protocol. Encouraged patient to notify staff if thoughts of harm toward self or others arise. Patient verbalize understanding and agreement. Will continue to monitor for safety.    

## 2022-02-15 NOTE — ED Notes (Signed)
Blood draw to R AC x1 stick complete. Pt tolerated without difficulty

## 2022-02-15 NOTE — ED Notes (Signed)
Pt in room with provider. No acute distress noted. Safety maintained.

## 2022-02-15 NOTE — BH IP Treatment Plan (Addendum)
Interdisciplinary Treatment and Diagnostic Plan Update  02/15/2022 Time of Session: 11:00AM  [name]. MRN: [id number]  Diagnosis:  Final diagnoses:  Alcohol abuse     Current Medications:  Current Facility-Administered Medications  Medication Dose Route Frequency Provider Last Rate Last Admin   acetaminophen (TYLENOL) tablet 650 mg  650 mg Oral Q6H PRN Briant Cedar, MD       alum & mag hydroxide-simeth (MAALOX/MYLANTA) 200-200-20 MG/5ML suspension 30 mL  30 mL Oral Q4H PRN Briant Cedar, MD       atorvastatin (LIPITOR) tablet 40 mg  40 mg Oral q morning Cinderella, Margaret A   40 mg at 02/14/22 0919   DULoxetine (CYMBALTA) DR capsule 60 mg  60 mg Oral Daily Cinderella, Margaret A   60 mg at 02/14/22 2542   furosemide (LASIX) tablet 40 mg  40 mg Oral Daily France Ravens, MD   40 mg at 02/14/22 0919   gabapentin (NEURONTIN) capsule 100 mg  100 mg Oral TID France Ravens, MD   100 mg at 02/14/22 2111   hydrOXYzine (ATARAX) tablet 25 mg  25 mg Oral TID PRN Briant Cedar, MD       magnesium hydroxide (MILK OF MAGNESIA) suspension 30 mL  30 mL Oral Daily PRN Briant Cedar, MD       multivitamin with minerals tablet 1 tablet  1 tablet Oral Daily Cinderella, Margaret A   1 tablet at 02/14/22 7062   nicotine (NICODERM CQ - dosed in mg/24 hours) patch 21 mg  21 mg Transdermal Q0600 Cinderella, Margaret A   21 mg at 02/15/22 3762   spironolactone (ALDACTONE) tablet 100 mg  100 mg Oral Daily France Ravens, MD   100 mg at 02/14/22 8315   thiamine (VITAMIN B1) tablet 100 mg  100 mg Oral Daily Briant Cedar, MD   100 mg at 02/14/22 0919   traZODone (DESYREL) tablet 50 mg  50 mg Oral QHS PRN Briant Cedar, MD       Current Outpatient Medications  Medication Sig Dispense Refill   atorvastatin (LIPITOR) 40 MG tablet TAKE ONE TABLET BY MOUTH EVERY MORNING 90 tablet 1   gabapentin (NEURONTIN) 300 MG capsule TAKE TWO CAPSULES BY MOUTH THREE times daily 180  capsule 3   olmesartan (BENICAR) 5 MG tablet TAKE TWO TABLETS BY MOUTH EVERY MORNING 60 tablet 2   DULoxetine (CYMBALTA) 60 MG capsule Take 1 capsule (60 mg total) by mouth daily. 90 capsule 1   sildenafil (VIAGRA) 50 MG tablet TAKE 1/2 TABLET BY MOUTH AS NEEDED FOR erectile dysfunction ONE HOUR BEFORE sexual activity 15 tablet 2   PTA Medications: Prior to Admission medications   Medication Sig Start Date End Date Taking? Authorizing Provider  atorvastatin (LIPITOR) 40 MG tablet TAKE ONE TABLET BY MOUTH EVERY MORNING 08/23/21   [name], [name], MD  gabapentin (NEURONTIN) 300 MG capsule TAKE TWO CAPSULES BY MOUTH THREE times daily 10/13/21   [name], MD  olmesartan (BENICAR) 5 MG tablet TAKE TWO TABLETS BY MOUTH EVERY MORNING 01/16/22   [name], [name], MD  DULoxetine (CYMBALTA) 60 MG capsule Take 1 capsule (60 mg total) by mouth daily. 01/27/22   [name], [name], MD  sildenafil (VIAGRA) 50 MG tablet TAKE 1/2 TABLET BY MOUTH AS NEEDED FOR erectile dysfunction ONE HOUR BEFORE sexual activity 01/23/22   [name], MD    Patient Stressors: Financial difficulties   Medication change or noncompliance   Substance abuse    Patient  Strengths: Motivation for treatment/growth   Treatment Modalities: Medication Management, Group therapy, Case management,  1 to 1 session with clinician, Psychoeducation, Recreational therapy.   Physician Treatment Plan for Primary and Secondary Diagnosis:  Final diagnoses:  Alcohol abuse   Long Term Goal(s): Improvement in symptoms so as ready for discharge  Short Term Goals: Patient will verbalize feelings in meetings with treatment team members. Pt will complete the PHQ9 on admission, day 3 and discharge. Patient will take medications as prescribed daily.  Medication Management: Evaluate patient's response, side effects, and tolerance of medication regimen.  Therapeutic Interventions: 1 to 1 sessions, Unit Group  sessions and Medication administration.  Evaluation of Outcomes: Adequate for Discharge  LCSW Treatment Plan for Primary Diagnosis:  Final diagnoses:  Alcohol abuse    Long Term Goal(s): Safe transition to appropriate next level of care at discharge.  Short Term Goals: Facilitate acceptance of mental health diagnosis and concerns through verbal commitment to aftercare plan and appointments at discharge. and Identify minimum of 2 triggers associated with mental health/substance abuse issues with treatment team members.  Therapeutic Interventions: Assess for all discharge needs, 1 to 1 time with Education officer, museum, Explore available resources and support systems, Assess for adequacy in community support network, Educate family and significant other(s) on suicide prevention, Complete Psychosocial Assessment, Interpersonal group therapy.  Evaluation of Outcomes: Adequate for Discharge   Progress in Treatment: Attending groups: Yes. Participating in groups: Yes. Taking medication as prescribed: Yes. Toleration medication: Yes. Family/Significant other contact made: No, will contact:  no one at this time Patient understands diagnosis: Yes. Discussing patient identified problems/goals with staff: Yes. Medical problems stabilized or resolved: Yes. Denies suicidal/homicidal ideation: Yes. Issues/concerns per patient self-inventory: No. Other: None   New problem(s) identified: No, Describe:  None   New Short Term/Long Term Goal(s): [name] reports remaining abstinent from alcohol is his long term goal.   Patient Goals:  "I want to go to a facility"  Discharge Plan or Barriers: [name] plans to discharge home with outpatient substance abuse treatment and counseling services. [name] continues to express interest in residential treatment services, however he is not interested in possible options at this time.    Reason for Continuation of Hospitalization: Medication stabilization  Estimated Length  of Stay: Discharge, Friday 02/17/22  Last 3 [place] Suicide Severity Risk Score: [facility] from 02/11/2022 in [facility] ED from 02/10/2022 in [place] Office Visit from 09/01/2021 in [place] No Risk No Risk No Risk       Last PHQ 2/9 Scores:    01/26/2022    3:19 PM 09/01/2021    2:48 PM 05/17/2021    4:05 PM  Depression screen PHQ 2/9  Decreased Interest '1 1 1  '$ Down, Depressed, Hopeless '2 1 1  '$ PHQ - 2 Score '3 2 2  '$ Altered sleeping '2 2 2  '$ Tired, decreased energy '1 1 1  '$ Change in appetite 3 0 0  Feeling bad or failure about yourself  2 0 0  Trouble concentrating 0 1 1  Moving slowly or fidgety/restless 0 0 0  Suicidal thoughts '1 1 1  '$ PHQ-9 Score '12 7 7  '$ Difficult doing work/chores Somewhat difficult Somewhat difficult Somewhat difficult    Scribe for Treatment Team: Marylee Floras, LCSW 02/15/2022 9:04 AM

## 2022-02-15 NOTE — ED Notes (Signed)
Pt sitting in dayroom interacting with peers and watching tv. Denies concerns. Informed pt to notify staff with any needs or concerns. Will continue to monitor for safety.

## 2022-02-15 NOTE — ED Notes (Signed)
Patient A&Ox4. Denies intent to harm self/others when asked. Denies A/VH. Patient denies any physical complaints when asked. No acute distress noted. Support and encouragement provided. Routine safety checks conducted according to facility protocol. Encouraged patient to notify staff if thoughts of harm toward self or others arise. Patient verbalize understanding and agreement. Will continue to monitor for safety.    

## 2022-02-16 DIAGNOSIS — F1020 Alcohol dependence, uncomplicated: Secondary | ICD-10-CM | POA: Diagnosis not present

## 2022-02-16 LAB — COMPREHENSIVE METABOLIC PANEL
ALT: 35 U/L (ref 0–44)
AST: 47 U/L — ABNORMAL HIGH (ref 15–41)
Albumin: 3.3 g/dL — ABNORMAL LOW (ref 3.5–5.0)
Alkaline Phosphatase: 97 U/L (ref 38–126)
Anion gap: 7 (ref 5–15)
BUN: 8 mg/dL (ref 6–20)
CO2: 27 mmol/L (ref 22–32)
Calcium: 8.9 mg/dL (ref 8.9–10.3)
Chloride: 101 mmol/L (ref 98–111)
Creatinine, Ser: 0.65 mg/dL (ref 0.61–1.24)
GFR, Estimated: >60 mL/min (ref >60)
Glucose, Bld: 89 mg/dL (ref 70–99)
Potassium: 3.8 mmol/L (ref 3.5–5.1)
Sodium: 135 mmol/L (ref 135–145)
Total Bilirubin: 0.7 mg/dL (ref 0.3–1.2)
Total Protein: 6.6 g/dL (ref 6.5–8.1)

## 2022-02-16 LAB — CBC
HCT: 40.5 % (ref 39.0–52.0)
Hemoglobin: 14 g/dL (ref 13.0–17.0)
MCH: 35.4 pg — ABNORMAL HIGH (ref 26.0–34.0)
MCHC: 34.6 g/dL (ref 30.0–36.0)
MCV: 102.5 fL — ABNORMAL HIGH (ref 80.0–100.0)
Platelets: 69 10*3/uL — ABNORMAL LOW (ref 150–400)
RBC: 3.95 MIL/uL — ABNORMAL LOW (ref 4.22–5.81)
RDW: 12.6 % (ref 11.5–15.5)
WBC: 4.3 10*3/uL (ref 4.0–10.5)
nRBC: 0 % (ref 0.0–0.2)

## 2022-02-16 MED ORDER — GABAPENTIN 100 MG PO CAPS
200.0000 mg | ORAL_CAPSULE | Freq: Three times a day (TID) | ORAL | Status: DC
  Administered 2022-02-16 (×3): 200 mg via ORAL
  Filled 2022-02-16 (×4): qty 2

## 2022-02-16 NOTE — ED Notes (Signed)
Pt is in the bed sleeping. Respirations are even and unlabored. No acute distress noted. Will continue to monitor for safety. 

## 2022-02-16 NOTE — ED Notes (Signed)
Pt having dinner: cheeseburger, salad, fruit cup and juice

## 2022-02-16 NOTE — ED Notes (Signed)
At the courtyard with the nurse

## 2022-02-16 NOTE — ED Notes (Signed)
In group meeting with the [facility]

## 2022-02-16 NOTE — ED Notes (Signed)
Pt is in the dinning room watching TV. Respirations are even and unlabored. No acute distress noted. Will continue to monitor for safety.

## 2022-02-16 NOTE — ED Notes (Signed)
Received patient this PM. Patient in his bed sleeping. Patient slept through out the night. Patient respirations are even and unlabored. Will continue to monitor for safety.

## 2022-02-16 NOTE — Progress Notes (Signed)
Patient sitting in dayroom with peers watching a movie.  No distress.

## 2022-02-16 NOTE — Progress Notes (Signed)
Patient attended a recovery group run by RN while sitting in the court yard.  He was attentive and participated well.  Appears motivated for treatment and demonstrated fair insight.  AA materials given and reviewed.
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
## 2022-02-16 NOTE — ED Notes (Signed)
Patient is awake alert and pleasant on approach.  He is visible in day room watching a movie.  He ate breakfast and took meds without issue.  Patient has discharge home pending for tomorrow.  Patient encouraged to seek out staff to have needs met.  Will monitor and provide support as needed.

## 2022-02-16 NOTE — ED Notes (Signed)
Snacks given 

## 2022-02-17 DIAGNOSIS — F1020 Alcohol dependence, uncomplicated: Secondary | ICD-10-CM | POA: Diagnosis not present

## 2022-02-17 MED ORDER — GABAPENTIN 300 MG PO CAPS: 300.0000 mg | ORAL_CAPSULE | Freq: Three times a day (TID) | ORAL | 0 refills | Status: DC

## 2022-02-17 MED ORDER — NICOTINE 21 MG/24HR TD PT24: 21.0000 mg | MEDICATED_PATCH | Freq: Every day | TRANSDERMAL | 0 refills | Status: DC

## 2022-02-17 MED ORDER — GABAPENTIN 300 MG PO CAPS
300.0000 mg | ORAL_CAPSULE | Freq: Three times a day (TID) | ORAL | Status: DC
  Administered 2022-02-17: 300 mg via ORAL
  Filled 2022-02-17: qty 21

## 2022-02-17 MED ORDER — ATORVASTATIN CALCIUM 40 MG PO TABS: 40.0000 mg | ORAL_TABLET | Freq: Every morning | ORAL | 1 refills | Status: DC

## 2022-02-17 MED ORDER — SPIRONOLACTONE 100 MG PO TABS: 100.0000 mg | ORAL_TABLET | Freq: Every day | ORAL | 0 refills | Status: DC

## 2022-02-17 MED ORDER — FUROSEMIDE 40 MG PO TABS: 40.0000 mg | ORAL_TABLET | Freq: Every day | ORAL | 0 refills | Status: DC

## 2022-02-17 NOTE — ED Notes (Addendum)
Watching TV with peers

## 2022-02-17 NOTE — ED Notes (Signed)
Patient is presently asleep in bed without distress or complaint.  Patient is pending discharge later today.  Will monitor and provide supportive environment.

## 2022-02-17 NOTE — ED Notes (Signed)
Snacks given 

## 2022-02-17 NOTE — ED Notes (Signed)
watching group meeting videos

## 2022-02-17 NOTE — Progress Notes (Signed)
SPIRITUALITY GROUP NOTE  Spirituality group facilitated by Simone Curia, MDiv, [place].  Group Description:  Group focused on topic of hope.  Patients participated in facilitated discussion around topic, connecting with one another around experiences and definitions for hope.  Group members engaged with visual explorer photos, reflecting on what hope looks like for them today.  Group engaged in discussion around how their definitions of hope are present today in hospital.   Modalities: Psycho-social ed, Adlerian, Narrative, MI Patient Progress: [name] was present throughout group.  Engaged with facilitator and group members voluntarily.  Active in group discussion - noting areas he finds hope and ways he intents to continue to practice hope in his discharge.

## 2022-02-17 NOTE — Discharge Instructions (Signed)
Dear [name],  It was a pleasure taking care of you while you are in the hospital.  You were admitted due to alcohol use disorder and alcohol detox.    Please continue to take your medications as prescribed.  We have switched your olmesartan to Lasix plus Aldactone and increased your gabapentin to 300 mg 3 times a day.  Please discontinue taking olmesartan. There remainder of your medications have not been changed.  You have a follow-up visit with the IM clinic on 02/27/2022 at 2:45 PM.  Please ensure that you follow-up with this after discharge and note any side effects you may experience with your current medication regimen.  Take care! -Dr. Lurline Hare

## 2022-02-17 NOTE — ED Notes (Signed)
Pt in a group meeting

## 2022-02-17 NOTE — ED Notes (Signed)
Pt is in the bed sleeping. Respirations are even and unlabored. No acute distress noted. Will continue to monitored for safety.

## 2022-02-21 ENCOUNTER — Telehealth (HOSPITAL_COMMUNITY): Payer: Self-pay | Admitting: Licensed Clinical Social Worker

## 2022-02-21 NOTE — Telephone Encounter (Signed)
See previous telephone encounter.  Adam Phenix, [place], LCSW, [facility], [place] 02/21/2022

## 2022-02-21 NOTE — Telephone Encounter (Signed)
The therapist calls [place] as he was referred to this office via the referral que.   The therapist confirms his identify via two identifiers. He says that he was detoxed at the [facility] and is wanting inpatient residential treatment but has been unable to locate it.  The therapist recommends that he call his [employer] for a list of in-network, residential treatment centers.  He also provides [place] with the contact number for Ms. Jettie Pagan, National Clinical Liaison, with [employer].  Lastly, the therapist makes Nitish aware of the [facility] IOP in this office and provides Jachai with his direct callback number to call p.r.n.  Adam Phenix, [place], LCSW, [facility], [place] 02/21/2022

## 2022-02-27 ENCOUNTER — Ambulatory Visit (INDEPENDENT_AMBULATORY_CARE_PROVIDER_SITE_OTHER): Payer: Medicare Other | Admitting: Student

## 2022-02-27 ENCOUNTER — Encounter: Payer: Self-pay | Admitting: Student

## 2022-02-27 ENCOUNTER — Other Ambulatory Visit: Payer: Self-pay

## 2022-02-27 VITALS — BP 126/82 | HR 82 | Temp 97.7°F | Ht 67.0 in | Wt 147.5 lb

## 2022-02-27 DIAGNOSIS — I10 Essential (primary) hypertension: Secondary | ICD-10-CM | POA: Diagnosis not present

## 2022-02-27 DIAGNOSIS — K7030 Alcoholic cirrhosis of liver without ascites: Secondary | ICD-10-CM | POA: Diagnosis not present

## 2022-02-27 DIAGNOSIS — F17210 Nicotine dependence, cigarettes, uncomplicated: Secondary | ICD-10-CM | POA: Diagnosis not present

## 2022-02-27 DIAGNOSIS — F1020 Alcohol dependence, uncomplicated: Secondary | ICD-10-CM

## 2022-02-27 DIAGNOSIS — F17200 Nicotine dependence, unspecified, uncomplicated: Secondary | ICD-10-CM

## 2022-02-27 LAB — PROTIME-INR
INR: 1.1 (ref 0.8–1.2)
Prothrombin Time: 14.5 seconds (ref 11.4–15.2)

## 2022-02-27 MED ORDER — FUROSEMIDE 40 MG PO TABS: 40.0000 mg | ORAL_TABLET | Freq: Every day | ORAL | 0 refills | Status: DC

## 2022-02-27 MED ORDER — NICOTINE 21 MG/24HR TD PT24: 21.0000 mg | MEDICATED_PATCH | TRANSDERMAL | 1 refills | Status: DC

## 2022-02-27 MED ORDER — SPIRONOLACTONE 100 MG PO TABS: 100.0000 mg | ORAL_TABLET | Freq: Every day | ORAL | 0 refills | Status: DC

## 2022-02-27 NOTE — Patient Instructions (Signed)
[name] Dylan Bastos., it was a pleasure seeing you today!  Today we discussed: - Congratulations, [name]! You have stopped drinking alcohol, which is something very difficult to do. We want to celebrate this accomplishment - this is huge!  - Please continue to take furosemide (lasix) and spironolactone. Both of these medications help keep fluid off of your abdomen. We will check lab work today to make sure your electrolytes look good.  - I have sent in the nicotine patch to [facility].   I have ordered the following labs today:   Lab Orders         CMP14 + Anion Gap         CBC no Diff         Protime-INR       I have ordered the following medication/changed the following medications:   Start the following medications: Meds ordered this encounter  Medications   nicotine (NICODERM CQ - DOSED IN MG/24 HOURS) 21 mg/24hr patch    Sig: Place 1 patch (21 mg total) onto the skin daily.    Dispense:  30 patch    Refill:  1   spironolactone (ALDACTONE) 100 MG tablet    Sig: Take 1 tablet (100 mg total) by mouth daily.    Dispense:  90 tablet    Refill:  0   furosemide (LASIX) 40 MG tablet    Sig: Take 1 tablet (40 mg total) by mouth daily.    Dispense:  90 tablet    Refill:  0     Follow-up:  1 month    Please make sure to arrive 15 minutes prior to your next appointment. If you arrive late, you may be asked to reschedule.   We look forward to seeing you next time. Please call our clinic at [phone] if you have any questions or concerns. The best time to call is Monday-Friday from 9am-4pm, but there is someone available 24/7. If after hours or the weekend, call the main hospital number and ask for the Internal Medicine Resident On-Call. If you need medication refills, please notify your pharmacy one week in advance and they will send [place] a request.  Thank you for letting us take part in your care. Wishing you the best!  Thank you, Sanjuan Dame, MD

## 2022-02-28 LAB — CBC
Hematocrit: 47.7 % (ref 37.5–51.0)
Hemoglobin: 15.9 g/dL (ref 13.0–17.7)
MCH: 32.9 pg (ref 26.6–33.0)
MCHC: 33.3 g/dL (ref 31.5–35.7)
MCV: 99 fL — ABNORMAL HIGH (ref 79–97)
Platelets: 104 10*3/uL — ABNORMAL LOW (ref 150–450)
RBC: 4.84 x10E6/uL (ref 4.14–5.80)
RDW: 11.8 % (ref 11.6–15.4)
WBC: 4.5 10*3/uL (ref 3.4–10.8)

## 2022-02-28 LAB — CMP14 + ANION GAP
ALT: 58 IU/L — ABNORMAL HIGH (ref 0–44)
AST: 59 IU/L — ABNORMAL HIGH (ref 0–40)
Albumin/Globulin Ratio: 1.3 (ref 1.2–2.2)
Albumin: 4.5 g/dL (ref 3.8–4.9)
Alkaline Phosphatase: 133 IU/L — ABNORMAL HIGH (ref 44–121)
Anion Gap: 16 mmol/L (ref 10.0–18.0)
BUN/Creatinine Ratio: 13 (ref 9–20)
BUN: 12 mg/dL (ref 6–24)
Bilirubin Total: 0.4 mg/dL (ref 0.0–1.2)
CO2: 26 mmol/L (ref 20–29)
Calcium: 9.8 mg/dL (ref 8.7–10.2)
Chloride: 100 mmol/L (ref 96–106)
Creatinine, Ser: 0.95 mg/dL (ref 0.76–1.27)
Globulin, Total: 3.6 g/dL (ref 1.5–4.5)
Glucose: 99 mg/dL (ref 70–99)
Potassium: 4.3 mmol/L (ref 3.5–5.2)
Sodium: 142 mmol/L (ref 134–144)
Total Protein: 8.1 g/dL (ref 6.0–8.5)
eGFR: 93 mL/min/{1.73_m2} (ref >59)

## 2022-02-28 NOTE — Assessment & Plan Note (Signed)
Mr. Mauss reports that he recently stopped drinking alcohol ~3 weeks ago. Mentions that he decided he needed to stop for his health. He was admitted to [facility] for detox from alcohol use. At that time was started on Librium, which has since been tapered. Today, Mr. Coufal reports that he feels great and abstained from alcohol since his discharge from [facility]. Mentions he had some mild tremors in detox, but denies any symptoms over the last couple of weeks. States that he previously drank heavily with friends that would come over, but says his friends have stopped coming over since he stopped drinking. He notes that he believes he is better off without these certain people. He denies any cravings, does not feel like he needs any assistance from medication.  Congratulated Mr. Topel on his enormous accomplishment. We look forward to seeing Mr. Schindel continue to improve as he shifts his focus towards a healthier lifestyle.
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
## 2022-02-28 NOTE — Assessment & Plan Note (Addendum)
Based on lab work obtained during the visit today, MELD 7, Child-Pugh Class A, FIB-4 of 4.14, advanced fibrosis (METAVIR stage F3-F4); stage Ishak 4-6. Patient has not heard from gastroenterology and is awaiting his ultrasound w/ elastography. Thankfully, he has stopped drinking alcohol and appears to be doing well. He has mild ascites on exam, otherwise no abdominal pain. He was recently started on spironolactone and furosemide, will continue with this.  - Furosemide '40mg'$  daily - Spironolactone '100mg'$  daily - Follow-up ultrasound with elastography - Follow-up with gastroenterology

## 2022-02-28 NOTE — Assessment & Plan Note (Signed)
[name] reports he has decreased his cigarette intake markedly over the last few weeks. He was previously smoking ~1.5ppd and he is now down to ~0.5ppd. He mentions that he previously smoked more when he drank, so when he stopped drinking he naturally stopped smoking. He is still interested and motivated to quit smoking. Discussed potential medication options, he has elected for nicotine replacement therapy with nicotine patches.  - Nicotine patch ordered

## 2022-02-28 NOTE — Assessment & Plan Note (Addendum)
BP Readings from Last 3 Encounters:  02/27/22 126/82  02/17/22 118/81  02/11/22 108/68   Blood pressure at goal with current medications. Recently his olmesartan was d/c'd and was started on furosemide and spironolactone due to increased ascites. Patient has not had any adverse reactions nor signs of dehydration since this time. Denies lightheadedness, pre-syncope, dyspnea. No other indication (HF, proteinuria, CKD) for ARB, will continue with current regimen and re-check electrolytes, renal function today.   - Continue holding olmesartan - Spironolactone '100mg'$  daily - Furosemide '40mg'$  daily - Follow-up CMP today

## 2022-03-01 NOTE — Progress Notes (Signed)
Internal Medicine Clinic Attending  I saw and evaluated the patient.  I personally confirmed the key portions of the history and exam documented by Dr. Braswell and I reviewed pertinent patient test results.  The assessment, diagnosis, and plan were formulated together and I agree with the documentation in the resident's note.  

## 2022-03-29 ENCOUNTER — Other Ambulatory Visit: Payer: Self-pay

## 2022-03-29 MED ORDER — GABAPENTIN 300 MG PO CAPS: 300.0000 mg | ORAL_CAPSULE | Freq: Three times a day (TID) | ORAL | 1 refills | Status: DC

## 2022-03-29 NOTE — Telephone Encounter (Signed)
Refill request on gabapentin (NEURONTIN) 300 MG capsule (Expired).

## 2022-04-17 ENCOUNTER — Other Ambulatory Visit: Payer: Self-pay | Admitting: Student

## 2022-04-17 NOTE — Telephone Encounter (Signed)
Benicar was discontinued.

## 2022-04-18 ENCOUNTER — Ambulatory Visit (INDEPENDENT_AMBULATORY_CARE_PROVIDER_SITE_OTHER): Payer: Medicare Other | Admitting: Student

## 2022-04-18 ENCOUNTER — Encounter: Payer: Self-pay | Admitting: Student

## 2022-04-18 ENCOUNTER — Encounter: Payer: Self-pay | Admitting: Physician Assistant

## 2022-04-18 DIAGNOSIS — F17200 Nicotine dependence, unspecified, uncomplicated: Secondary | ICD-10-CM

## 2022-04-18 DIAGNOSIS — I10 Essential (primary) hypertension: Secondary | ICD-10-CM | POA: Diagnosis not present

## 2022-04-18 DIAGNOSIS — Z0000 Encounter for general adult medical examination without abnormal findings: Secondary | ICD-10-CM

## 2022-04-18 DIAGNOSIS — K7030 Alcoholic cirrhosis of liver without ascites: Secondary | ICD-10-CM

## 2022-04-18 DIAGNOSIS — F17210 Nicotine dependence, cigarettes, uncomplicated: Secondary | ICD-10-CM

## 2022-04-18 DIAGNOSIS — F1020 Alcohol dependence, uncomplicated: Secondary | ICD-10-CM | POA: Diagnosis not present

## 2022-04-18 MED ORDER — FUROSEMIDE 40 MG PO TABS: 40.0000 mg | ORAL_TABLET | Freq: Every day | ORAL | 0 refills | Status: DC

## 2022-04-18 MED ORDER — NICOTINE 21 MG/24HR TD PT24: 21.0000 mg | MEDICATED_PATCH | TRANSDERMAL | 1 refills | Status: AC

## 2022-04-18 MED ORDER — SPIRONOLACTONE 100 MG PO TABS: 100.0000 mg | ORAL_TABLET | Freq: Every day | ORAL | 0 refills | Status: DC

## 2022-04-18 MED ORDER — NALTREXONE HCL 50 MG PO TABS: 50.0000 mg | ORAL_TABLET | Freq: Every day | ORAL | 1 refills | Status: DC

## 2022-04-18 NOTE — Patient Instructions (Addendum)
Thank you, [name]. for allowing [place] to provide your care today. Today we discussed.  Alcohol use disorder I am glad that you are doing better! Please keep up the excellent work and call us if you need anything. Please continue to take the naltrexone daily. It will be important to consume a high protein diet. Please purchase protein supplements like ensure.   Cirrhosis Please follow up with GI doctors on 05/19/22 at 11:30. Please pick up your prescriptions for lasix and aldactone.   High blood pressure Please stop taking olmesartan.   Healthcare maintenance Please go to your local pharmacy to receive your flu shot once you are feeling better  I have ordered the following labs for you:  Lab Orders  No laboratory test(s) ordered today     Referrals ordered today:   Referral Orders  No referral(s) requested today     I have ordered the following medication/changed the following medications:   Stop the following medications: Medications Discontinued During This Encounter  Medication Reason   spironolactone (ALDACTONE) 100 MG tablet Reorder   furosemide (LASIX) 40 MG tablet Reorder     Start the following medications: Meds ordered this encounter  Medications   spironolactone (ALDACTONE) 100 MG tablet    Sig: Take 1 tablet (100 mg total) by mouth daily.    Dispense:  90 tablet    Refill:  0   furosemide (LASIX) 40 MG tablet    Sig: Take 1 tablet (40 mg total) by mouth daily.    Dispense:  90 tablet    Refill:  0     Follow up: 4-6 months    Should you have any questions or concerns please call the internal medicine clinic at [phone].    Sanjuana Letters, D.O. [place]

## 2022-04-18 NOTE — Assessment & Plan Note (Signed)
Completed alcohol detox 01/2022. He states he feels much better, he is no longer requiring a cane to ambulate and has had improvement in his relationship with family members. He denies further desire to use alcohol. We will continue naltrexone and encouraged him to reach out if needs assistance -continue naltrexone 50 mg daily -encourage continued alcohol cessation

## 2022-04-18 NOTE — Assessment & Plan Note (Signed)
Patient unfortunately missed his appointment for lung cancer screening with his 38 pack year smoking history. Will reorder CT chest. Recommended he receive influenza vaccine at pharmacy, he is continuing to recover from URI.

## 2022-04-18 NOTE — Assessment & Plan Note (Signed)
BP today of 143/83. Patient states he ran out of his spironolactone and lasix. Denies taking olmesartan since being discharged. Denies significant abdominal or lower extremity swelling. Will reorder spironolactone and lasix. Prior blood pressures well controlled.  -reorder lasix 40 mg daily and spironolactone 100 mg daily -will contact pharmacy and make certain olmesartan discontinued

## 2022-04-18 NOTE — Assessment & Plan Note (Signed)
Nicotine patch reordered.
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
## 2022-04-18 NOTE — Assessment & Plan Note (Addendum)
Fib-4 of 11.11, advanced fibrosis. He completed alcohol detox in 08/23 and since, has only had 2 beers 2 weeks ago. Ascites absent on exam today. -continue spironolactone/lasix daily  -refer to GI for endoscopy.  -repeat US and continue to perform every 6 months for [facility] screening.  -encouraged to increase protein consumption; ensures or other protein supplement drinks  Addendum 11/16 - RUQ [place] without acute abnormalities. Continue 6 month screening

## 2022-04-20 NOTE — Progress Notes (Signed)
Internal Medicine Clinic Attending  Case discussed with Dr. [name]  At the time of the visit.  We reviewed the resident's history and exam and pertinent patient test results.  I agree with the assessment, diagnosis, and plan of care documented in the resident's note.  

## 2022-04-20 NOTE — Addendum Note (Signed)
Addended by: Lalla Brothers T on: 04/20/2022 11:12 AM   Modules accepted: Level of Service

## 2022-04-21 ENCOUNTER — Other Ambulatory Visit: Payer: Self-pay | Admitting: Student

## 2022-05-08 ENCOUNTER — Other Ambulatory Visit: Payer: Self-pay

## 2022-05-08 ENCOUNTER — Ambulatory Visit (HOSPITAL_BASED_OUTPATIENT_CLINIC_OR_DEPARTMENT_OTHER): Payer: Medicare Other

## 2022-05-08 DIAGNOSIS — M79605 Pain in left leg: Secondary | ICD-10-CM

## 2022-05-08 DIAGNOSIS — F1020 Alcohol dependence, uncomplicated: Secondary | ICD-10-CM

## 2022-05-12 ENCOUNTER — Ambulatory Visit (HOSPITAL_BASED_OUTPATIENT_CLINIC_OR_DEPARTMENT_OTHER)
Admission: RE | Admit: 2022-05-12 | Discharge: 2022-05-12 | Disposition: A | Discharge status: Home / Self Care | Payer: Medicare Other | Source: Ambulatory Visit | Attending: Student in an Organized Health Care Education/Training Program | Admitting: Student in an Organized Health Care Education/Training Program

## 2022-05-12 DIAGNOSIS — F1020 Alcohol dependence, uncomplicated: Secondary | ICD-10-CM | POA: Insufficient documentation
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
## 2022-05-19 ENCOUNTER — Encounter: Payer: Self-pay | Admitting: Physician Assistant

## 2022-05-19 ENCOUNTER — Other Ambulatory Visit (INDEPENDENT_AMBULATORY_CARE_PROVIDER_SITE_OTHER): Payer: Medicare Other

## 2022-05-19 ENCOUNTER — Ambulatory Visit (INDEPENDENT_AMBULATORY_CARE_PROVIDER_SITE_OTHER): Payer: Medicare Other | Admitting: Physician Assistant

## 2022-05-19 VITALS — BP 150/90 | HR 102 | Ht 67.0 in | Wt 153.4 lb

## 2022-05-19 DIAGNOSIS — I10 Essential (primary) hypertension: Secondary | ICD-10-CM

## 2022-05-19 DIAGNOSIS — K7030 Alcoholic cirrhosis of liver without ascites: Secondary | ICD-10-CM

## 2022-05-19 DIAGNOSIS — D696 Thrombocytopenia, unspecified: Secondary | ICD-10-CM

## 2022-05-19 DIAGNOSIS — K838 Other specified diseases of biliary tract: Secondary | ICD-10-CM

## 2022-05-19 DIAGNOSIS — R7989 Other specified abnormal findings of blood chemistry: Secondary | ICD-10-CM

## 2022-05-19 DIAGNOSIS — R159 Full incontinence of feces: Secondary | ICD-10-CM

## 2022-05-19 DIAGNOSIS — F1020 Alcohol dependence, uncomplicated: Secondary | ICD-10-CM

## 2022-05-19 LAB — COMPREHENSIVE METABOLIC PANEL
ALT: 44 U/L (ref 0–53)
AST: 73 U/L — ABNORMAL HIGH (ref 0–37)
Albumin: 4 g/dL (ref 3.5–5.2)
Alkaline Phosphatase: 140 U/L — ABNORMAL HIGH (ref 39–117)
BUN: 10 mg/dL (ref 6–23)
CO2: 31 mEq/L (ref 19–32)
Calcium: 8.8 mg/dL (ref 8.4–10.5)
Chloride: 101 mEq/L (ref 96–112)
Creatinine, Ser: 0.82 mg/dL (ref 0.40–1.50)
GFR: 97.26 mL/min (ref >60.00)
Glucose, Bld: 92 mg/dL (ref 70–99)
Potassium: 3.7 mEq/L (ref 3.5–5.1)
Sodium: 140 mEq/L (ref 135–145)
Total Bilirubin: 0.8 mg/dL (ref 0.2–1.2)
Total Protein: 7.7 g/dL (ref 6.0–8.3)

## 2022-05-19 LAB — AMMONIA: Ammonia: 34 umol/L (ref 11–35)

## 2022-05-19 LAB — CBC WITH DIFFERENTIAL/PLATELET
Basophils Absolute: 0 10*3/uL (ref 0.0–0.1)
Basophils Relative: 0.6 % (ref 0.0–3.0)
Eosinophils Absolute: 0 10*3/uL (ref 0.0–0.7)
Eosinophils Relative: 0.3 % (ref 0.0–5.0)
HCT: 37 % — ABNORMAL LOW (ref 39.0–52.0)
Hemoglobin: 12.6 g/dL — ABNORMAL LOW (ref 13.0–17.0)
Lymphocytes Relative: 29.4 % (ref 12.0–46.0)
Lymphs Abs: 1.7 10*3/uL (ref 0.7–4.0)
MCHC: 34 g/dL (ref 30.0–36.0)
MCV: 102 fl — ABNORMAL HIGH (ref 78.0–100.0)
Monocytes Absolute: 0.6 10*3/uL (ref 0.1–1.0)
Monocytes Relative: 10.7 % (ref 3.0–12.0)
Neutro Abs: 3.3 10*3/uL (ref 1.4–7.7)
Neutrophils Relative %: 59 % (ref 43.0–77.0)
Platelets: 77 10*3/uL — ABNORMAL LOW (ref 150.0–400.0)
RBC: 3.63 Mil/uL — ABNORMAL LOW (ref 4.22–5.81)
RDW: 17.4 % — ABNORMAL HIGH (ref 11.5–15.5)
WBC: 5.6 10*3/uL (ref 4.0–10.5)

## 2022-05-19 LAB — PROTIME-INR
INR: 1.1 ratio — ABNORMAL HIGH (ref 0.8–1.0)
Prothrombin Time: 12.2 s (ref 9.6–13.1)

## 2022-05-19 LAB — IBC + FERRITIN
Ferritin: 458.8 ng/mL — ABNORMAL HIGH (ref 22.0–322.0)
Iron: 93 ug/dL (ref 42–165)
Saturation Ratios: 24.7 % (ref 20.0–50.0)
TIBC: 376.6 ug/dL (ref 250.0–450.0)
Transferrin: 269 mg/dL (ref 212.0–360.0)

## 2022-05-19 MED ORDER — NA SULFATE-K SULFATE-MG SULF 17.5-3.13-1.6 GM/177ML PO SOLN: 1.0000 | Freq: Once | ORAL | 0 refills | Status: AC

## 2022-05-19 NOTE — Patient Instructions (Addendum)
You will be contacted by [facility] in the next 2 days to arrange an MRCP.  The number on your caller ID will be [phone], please answer when they call.  If you have not heard from them in 2 days please call [phone] to schedule.    Your provider has requested that you go to the basement level for lab work before leaving today. Press "B" on the elevator. The lab is located at the first door on the left as you exit the elevator.  You have been scheduled for an endoscopy and colonoscopy. Please follow the written instructions given to you at your visit today. Please pick up your prep supplies at the pharmacy within the next 1-3 days. If you use inhalers (even only as needed), please bring them with you on the day of your procedure.    TYLENOL (ACETAMINOPHEN) IS SAFE IN LIVER DISEASE: You can take tylenol (acetaminophen) up to 2,000 mg/day. This would be four extra strength ('500mg'$ ) tablets over 24 hours OR six regular strength (325 mg) tablets over 24 hours. Please be sure to read the ingredients of over the counter medications and prescription pain medications as many contain acetaminophen.   NO NSAIDS (ibuprofen, advil, naproxen, aleve, motrin...)  HEPATIC ENCEPHALOPATHY HEPATIC ENCEPHALOPATHY: Confusion caused by a build up of toxins in the blood due to the liver not being able to filter toxins. This can cause confusion mild or severe, increase falls. If you have been diagnosed with Hepatic encephalopathy, advised to not drive due to increased risk   LACTULOSE:  helps pull ammonia and other toxins from the blood into your stool when you have a bowel movement NO NEED TO CHECK AMMONIA LEVEL IN BLOOD! Only need to check if you are having symptoms. 30-11m up to four times a day Take a dose in the morning If by lunch time you have not had AT LEAST 2 bowel movements take another dose If by dinner you have not had AT LEAST 2 bowel movements that day take another  dose If by bedtime you still have not had 2 bowel movements take another dose Goal of 3-4 bowel movements per day Avoid taking with food as this will cause more gas  IF YOU ARE VERY SLEEPY, HARD FOR YOUR FAMILY TO WAKE YOU UP, OR FALLING ASLEEP DURING CONVERSATIONS -INCREASE LACTULOSE/GO TO THE EMERGENCY ROOM  XIFAXAN (rifaximin) An antibiotic that helps limit excess bacteria in the intestine.  The bacteria can cause increased ammonia One '550mg'$  tablet twice daily  SUPPLEMENTS: multivitamin Zinc Branch Chain Amino acids (BCAA): 12 grams/day L-Ornithine L-Aspartate (LOLA): 6 grams three times/day ([email or web address]-[email or web address]/ L-Carnitine  PHYSICAL ACTIVITY It is important to continue to be active when you have cirrhosis. Exercise will help reduce muscle loss and weakness.  DISCUSS REFERRAL FOR PHYSICAL THERAPY WITH YOUR PRIMARY CARE PROVIDER  DIET/NUTRITION FOR CIRRHOSIS NO ALCOHOL YOUR GOALS Evening snack - high protein Supplements between meals to help meet calorie and protein goal: Boost Ensure Premier Protein Shakes Protein Greek yogurt Fish, chicken (NO RAW OR UNDERCOOKED FISH/SHELLFISH) Avoid pork and red meat Plant based protein (non-soy)/Vegan: Lentils, Chickpeas, Peanuts (non salted), almonds (non salted), quinoa, chia seeds  Plant based protein supplements (not soy)  Avoid/limit animal based protein supplements: whey, casein 4.  Low sodium (2,000 mg/day) A. Avoid: table salt, canned foods, deli meats, sausages, hot dogs, anything with a long shelf life B. Read nutrition labels and be aware of serving size. Don't go by percent of daily value.  Behavioral Health Resources in the Community  Can check with PCP about naltrexone for cravings.   Intensive Outpatient Programs:  [place], [place]  Both a day and evening program  [facility]  [address]   [place], [place] [address]  [phone]  [facility]: [facility]  [address]  [place]  [phone]    Residential Treatment Programs:  [facility]  [place]  [address], [place]  [place], [place] [address]  [place]  [address] [place], [place] [address]  [phone]  Admissions: 8am-3pm M-F  [facility] [place]  [address], [place] [address]  [phone]  [facility]  [address] Jadene Pierini  [place], [place]  [facility]  [address]  [place], [place]  [facility] - Intensive Outpatient  [address] [place] [address]  [place], [place]  [facility] ([place]  [place], [place] or [phone]  Residential Treatment Services (RTS), Medicaid  [place], [place]  [facility] [address]  [address]  [place]  [place]: [phone] or [phone]  [address] [place], [place] [address]  [email or web address]   Substance Abuse Resources:  [facility] [place] [phone]  [facility] [place]  Chinita Pester [phone]  Residential & Outpatient Substance Abuse Program 3523250454  Psychological Services:  [place] [address] [phone]  [place], [place] [address] [place], [place] LINE: [phone] or [phone], [email or web address]  Mobile Crisis Teams:  [facility]  Mobile Crisis Care Unit  [phone]  Assertive  Psychotherapeutic Services  [address] [place]  [phone]   Self-Help/Support Groups:  Mental [facility] Assoc[facility] of  [employer] of support groups  [phone] (call for more info)  Narcotics Anonymous (NA)  [facility]  [address]  [place] - 2 meetings at this location

## 2022-05-23 ENCOUNTER — Other Ambulatory Visit: Payer: Self-pay | Admitting: Student

## 2022-05-25 LAB — AFP TUMOR MARKER: AFP-Tumor Marker: 6.9 ng/mL — ABNORMAL HIGH (ref <6.1)

## 2022-05-25 LAB — IGG: IgG (Immunoglobin G), Serum: 1477 mg/dL (ref 600–1640)

## 2022-05-25 LAB — MITOCHONDRIAL ANTIBODIES: Mitochondrial M2 Ab, IgG: <=20 U (ref <=20.0)

## 2022-05-25 LAB — ANTI-SMOOTH MUSCLE ANTIBODY, IGG: Actin (Smooth Muscle) Antibody (IGG): 36 U — ABNORMAL HIGH (ref <20)

## 2022-05-25 LAB — HEPATITIS A ANTIBODY, TOTAL: Hepatitis A AB,Total: REACTIVE — AB

## 2022-05-25 LAB — ANA: Anti Nuclear Antibody (ANA): NEGATIVE

## 2022-05-26 ENCOUNTER — Other Ambulatory Visit: Payer: Self-pay | Admitting: Physician Assistant

## 2022-05-26 ENCOUNTER — Ambulatory Visit (HOSPITAL_COMMUNITY): Admission: RE | Admit: 2022-05-26 | Payer: Medicare Other | Source: Ambulatory Visit

## 2022-05-26 DIAGNOSIS — R7989 Other specified abnormal findings of blood chemistry: Secondary | ICD-10-CM

## 2022-05-26 DIAGNOSIS — K838 Other specified diseases of biliary tract: Secondary | ICD-10-CM

## 2022-05-29 ENCOUNTER — Other Ambulatory Visit: Payer: Self-pay

## 2022-05-29 DIAGNOSIS — F1020 Alcohol dependence, uncomplicated: Secondary | ICD-10-CM

## 2022-05-30 ENCOUNTER — Encounter (HOSPITAL_COMMUNITY): Payer: Self-pay | Admitting: Radiology

## 2022-05-30 ENCOUNTER — Ambulatory Visit (HOSPITAL_COMMUNITY)
Admission: RE | Admit: 2022-05-30 | Discharge: 2022-05-30 | Disposition: A | Discharge status: Home / Self Care | Payer: Medicare Other | Source: Ambulatory Visit | Attending: Physician Assistant | Admitting: Physician Assistant

## 2022-05-30 DIAGNOSIS — K838 Other specified diseases of biliary tract: Secondary | ICD-10-CM | POA: Diagnosis present

## 2022-05-30 DIAGNOSIS — R7989 Other specified abnormal findings of blood chemistry: Secondary | ICD-10-CM | POA: Diagnosis present

## 2022-05-30 MED ORDER — FUROSEMIDE 40 MG PO TABS: 40.0000 mg | ORAL_TABLET | Freq: Every day | ORAL | 0 refills | Status: DC

## 2022-05-30 MED ORDER — SPIRONOLACTONE 100 MG PO TABS: 100.0000 mg | ORAL_TABLET | Freq: Every day | ORAL | 0 refills | Status: DC

## 2022-05-30 MED ORDER — GADOBUTROL 1 MMOL/ML IV SOLN
7.0000 mL | Freq: Once | INTRAVENOUS | Status: AC | PRN
  Administered 2022-05-30: 7 mL via INTRAVENOUS
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
## 2022-06-03 IMAGING — US US ABDOMEN LIMITED RUQ/ASCITES
1 series · 14 of 25 positions shown · non-contrast
Comparison: Ultrasound 03/03/2020

CLINICAL DATA: Cirrhosis

EXAM:
ULTRASOUND ABDOMEN LIMITED RIGHT UPPER QUADRANT

[Series 1: abdomen us · 14 of 45 slices shown]
[im 1/45]
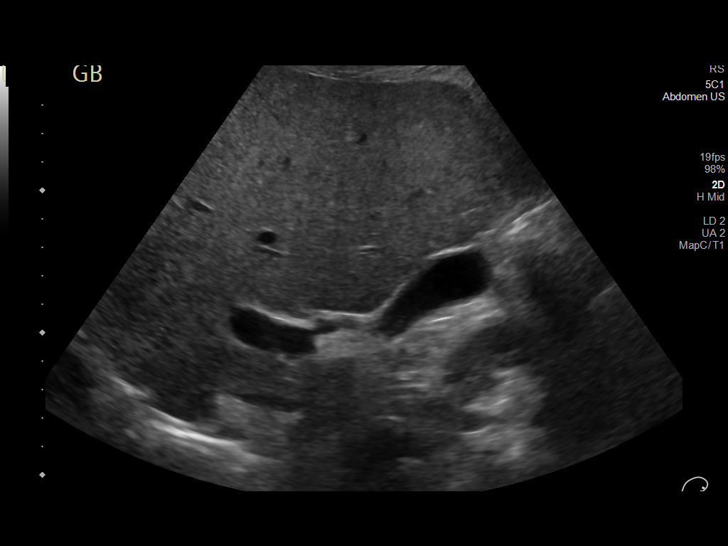
[im 4/45]
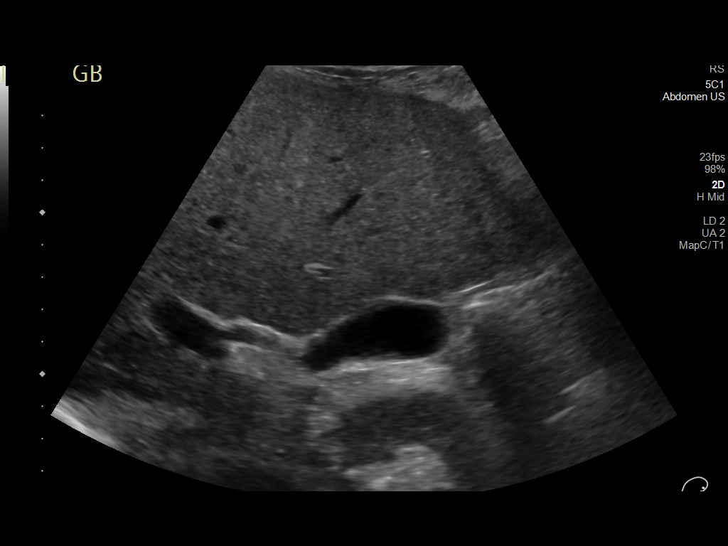
[im 8/45]
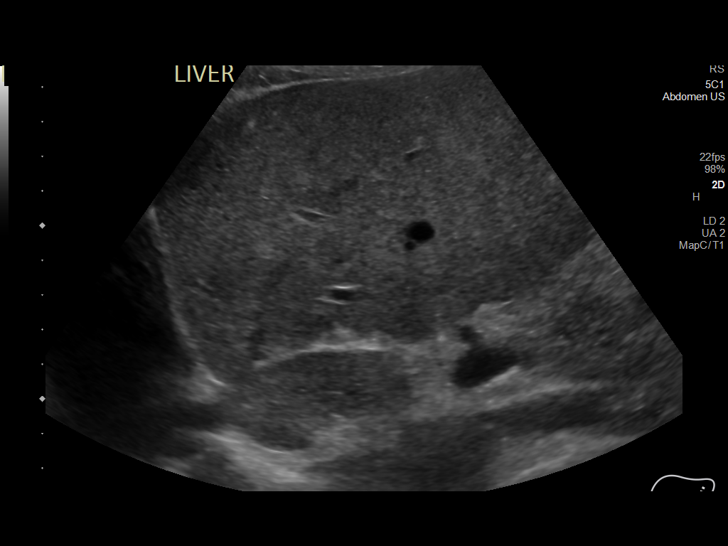
[im 12/45]
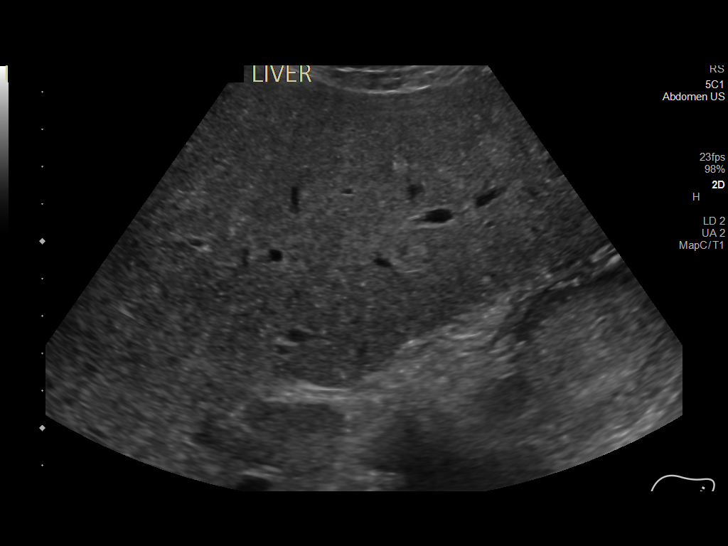
[im 15/45]
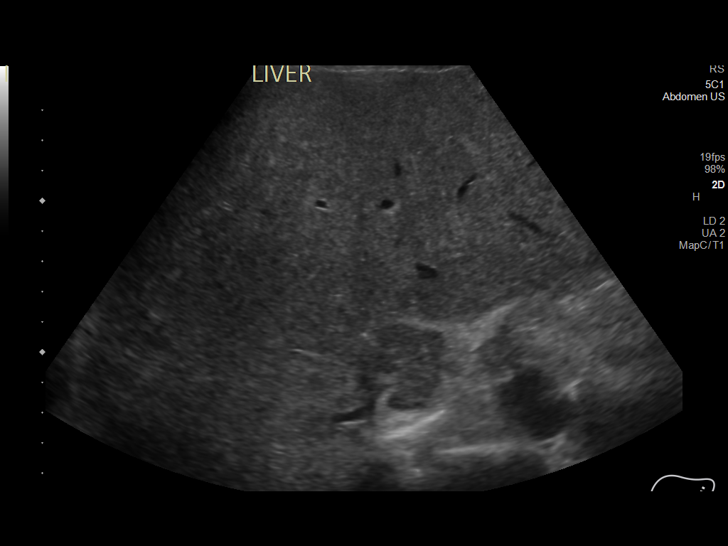
[im 17/45]
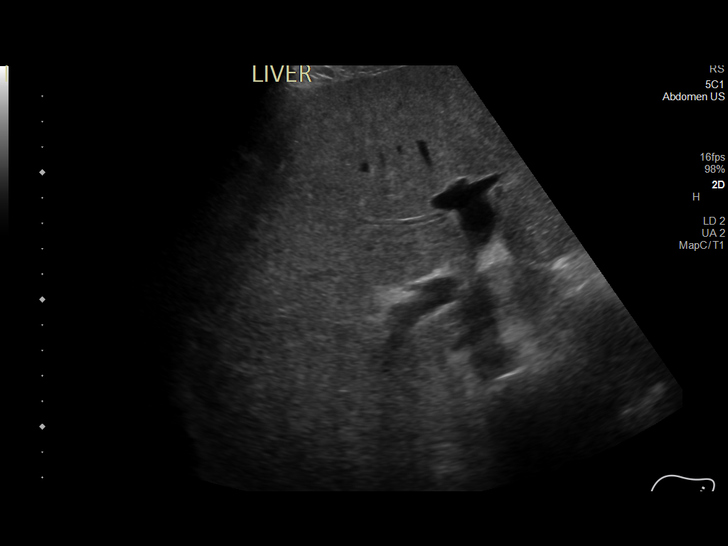
[im 21/45]
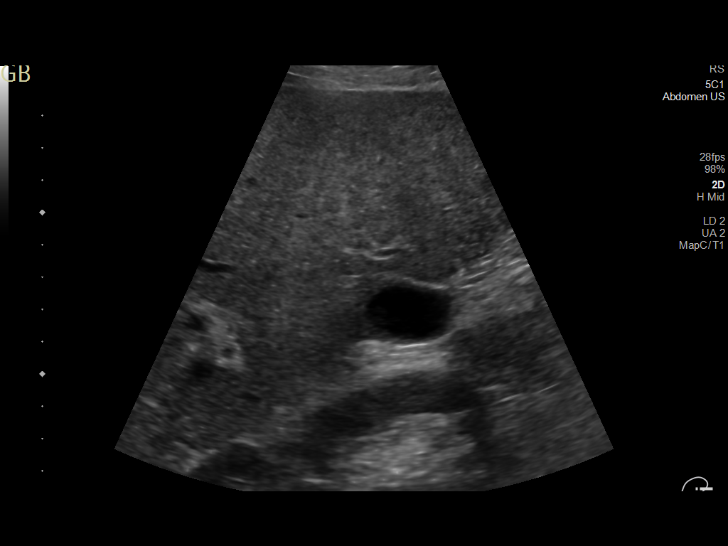
[im 24/45]
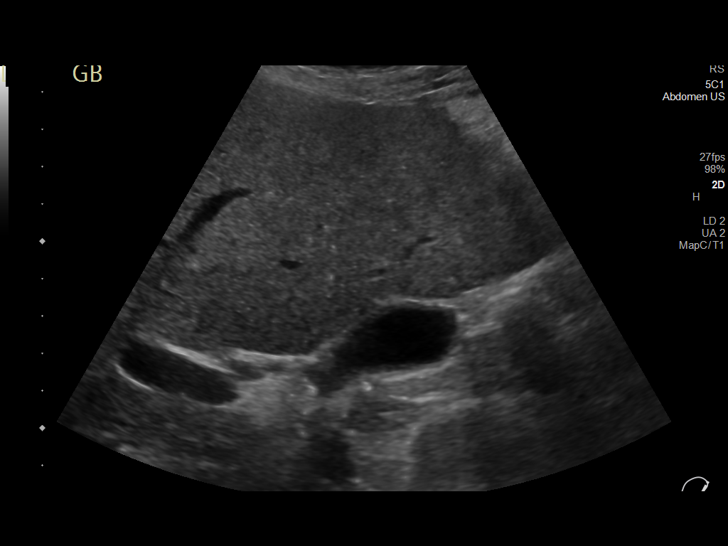
[im 28/45]
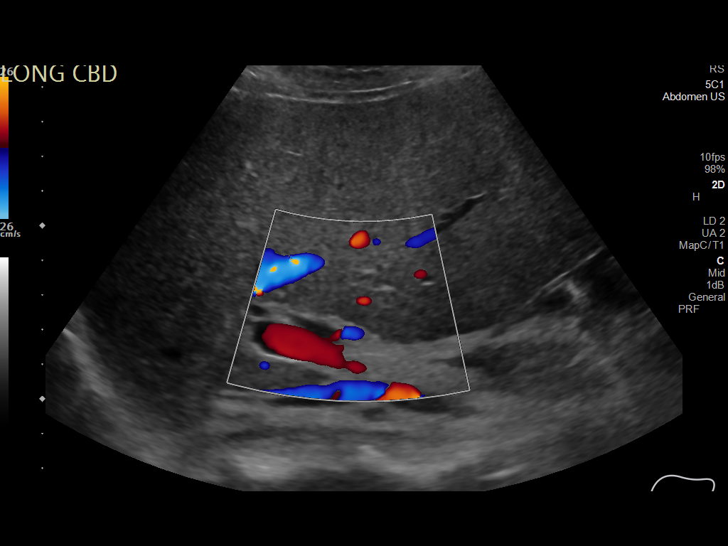
[im 30/45]
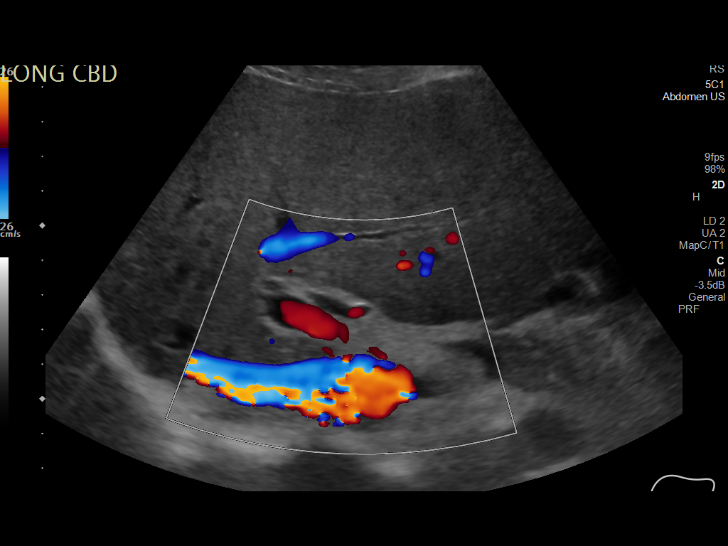
[im 34/45]
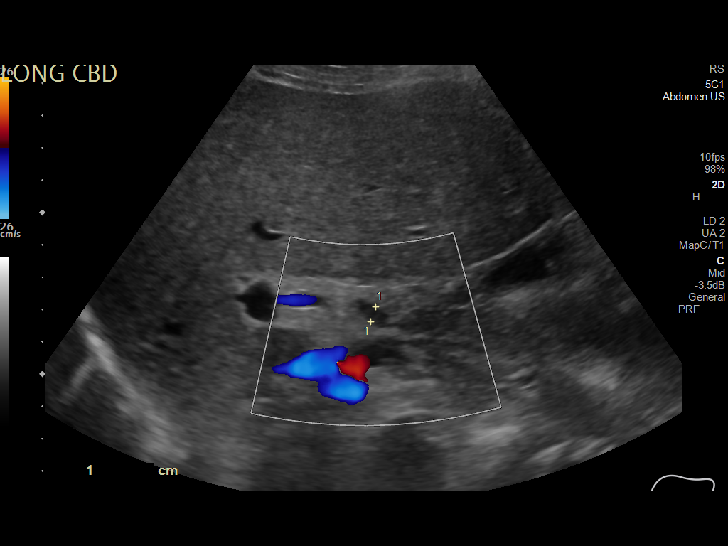
[im 37/45]
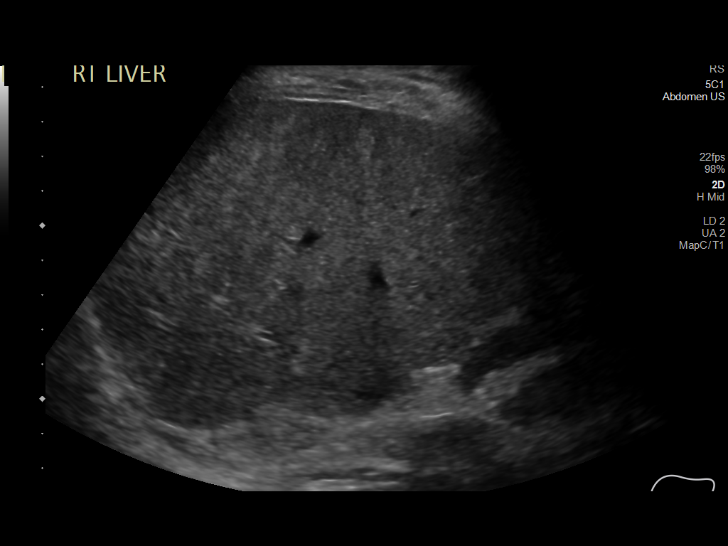
[im 41/45]
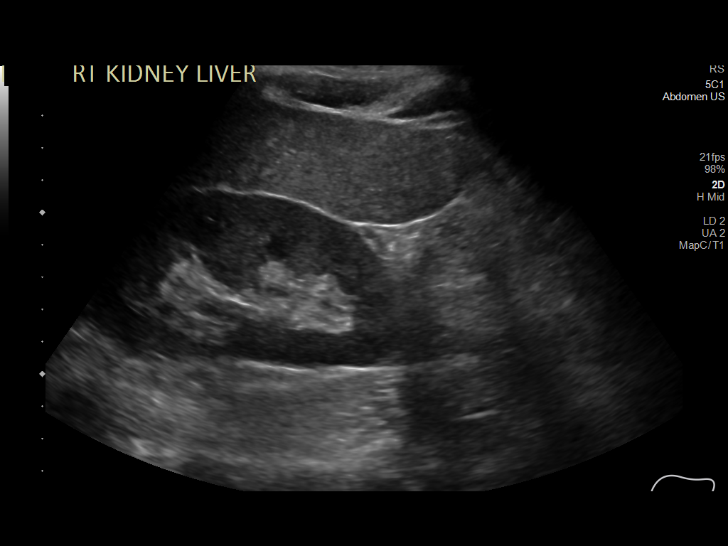
[im 45/45]
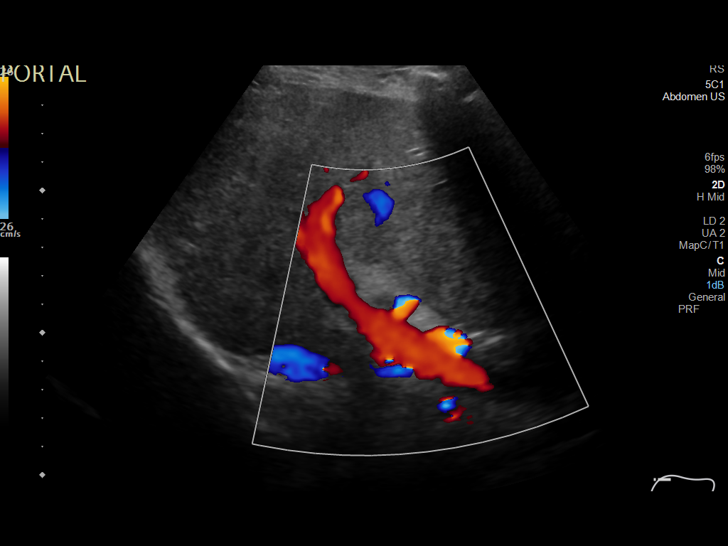

[14 of 25 positions shown; findings below may reference images not displayed]

FINDINGS: Gallbladder:

No gallstones. Slight increased wall thickness at 4.1 mm. No
sonographic Murphy sign noted by sonographer.

Common bile duct:

Diameter: 4 mm

Liver:

Slightly echogenic. Coarse heterogeneous and nodular with enlarged
left lobe. No focal hepatic abnormality. Portal vein is patent on
color Doppler imaging with normal direction of blood flow towards
the liver.

Other: None.
IMPRESSION: 1. Negative for gallstones. Slight gallbladder wall thickening which
is nonspecific; no other sonographic features to suggest acute
gallbladder disease
2. Coarse echogenic nodular liver, corresponding to history of
cirrhosis.

## 2022-06-05 NOTE — Progress Notes (Signed)
Agree with the assessment and plan as outlined by Dylan Collier, PA-C. ? ?Dylan Befort, DO, FACG ? ?

## 2022-06-14 ENCOUNTER — Ambulatory Visit (HOSPITAL_BASED_OUTPATIENT_CLINIC_OR_DEPARTMENT_OTHER): Admission: RE | Admit: 2022-06-14 | Payer: Medicare Other | Source: Ambulatory Visit

## 2022-06-21 ENCOUNTER — Ambulatory Visit (HOSPITAL_BASED_OUTPATIENT_CLINIC_OR_DEPARTMENT_OTHER)
Admission: RE | Admit: 2022-06-21 | Discharge: 2022-06-21 | Disposition: A | Discharge status: Home / Self Care | Payer: Medicare Other | Source: Ambulatory Visit | Attending: Student in an Organized Health Care Education/Training Program | Admitting: Student in an Organized Health Care Education/Training Program

## 2022-06-21 VITALS — Ht 68.4 in | Wt 154.0 lb

## 2022-06-21 DIAGNOSIS — F17210 Nicotine dependence, cigarettes, uncomplicated: Secondary | ICD-10-CM | POA: Insufficient documentation

## 2022-06-21 DIAGNOSIS — I2510 Atherosclerotic heart disease of native coronary artery without angina pectoris: Secondary | ICD-10-CM | POA: Insufficient documentation

## 2022-06-21 DIAGNOSIS — R918 Other nonspecific abnormal finding of lung field: Secondary | ICD-10-CM

## 2022-06-21 DIAGNOSIS — J439 Emphysema, unspecified: Secondary | ICD-10-CM | POA: Insufficient documentation

## 2022-06-21 DIAGNOSIS — Z122 Encounter for screening for malignant neoplasm of respiratory organs: Secondary | ICD-10-CM | POA: Insufficient documentation

## 2022-06-21 DIAGNOSIS — I700 Atherosclerosis of aorta: Secondary | ICD-10-CM | POA: Insufficient documentation

## 2022-06-21 DIAGNOSIS — F17200 Nicotine dependence, unspecified, uncomplicated: Secondary | ICD-10-CM | POA: Insufficient documentation

## 2022-06-21 DIAGNOSIS — Z0000 Encounter for general adult medical examination without abnormal findings: Secondary | ICD-10-CM | POA: Insufficient documentation

## 2022-06-29 ENCOUNTER — Other Ambulatory Visit: Payer: Self-pay | Admitting: Student

## 2022-06-29 DIAGNOSIS — R918 Other nonspecific abnormal finding of lung field: Secondary | ICD-10-CM

## 2022-06-29 MED ORDER — NICOTINE POLACRILEX 2 MG MT GUM: 2.0000 mg | CHEWING_GUM | OROMUCOSAL | 0 refills | Status: DC | PRN

## 2022-06-29 NOTE — Progress Notes (Signed)
CT lung cancer screening patient found to have irregular 3.2 mm opacity Lung-RADS 4AS. Recommending repeat scan in 3 months. Discussed with [name] will have him follow up in clinic next month and order to repeat CT scan in 3 months placed.

## 2022-07-04 ENCOUNTER — Encounter: Payer: Self-pay | Admitting: Gastroenterology

## 2022-07-10 ENCOUNTER — Other Ambulatory Visit (INDEPENDENT_AMBULATORY_CARE_PROVIDER_SITE_OTHER): Payer: Medicare Other

## 2022-07-10 ENCOUNTER — Encounter: Payer: Self-pay | Admitting: Gastroenterology

## 2022-07-10 ENCOUNTER — Ambulatory Visit (AMBULATORY_SURGERY_CENTER): Payer: Medicare Other | Admitting: Gastroenterology

## 2022-07-10 VITALS — BP 148/91 | HR 94 | Temp 98.9°F | Resp 21 | Ht 67.0 in | Wt 153.0 lb

## 2022-07-10 DIAGNOSIS — D124 Benign neoplasm of descending colon: Secondary | ICD-10-CM | POA: Diagnosis not present

## 2022-07-10 DIAGNOSIS — D122 Benign neoplasm of ascending colon: Secondary | ICD-10-CM | POA: Diagnosis not present

## 2022-07-10 DIAGNOSIS — K7030 Alcoholic cirrhosis of liver without ascites: Secondary | ICD-10-CM

## 2022-07-10 DIAGNOSIS — K5730 Diverticulosis of large intestine without perforation or abscess without bleeding: Secondary | ICD-10-CM

## 2022-07-10 DIAGNOSIS — Z1211 Encounter for screening for malignant neoplasm of colon: Secondary | ICD-10-CM | POA: Diagnosis not present

## 2022-07-10 DIAGNOSIS — D125 Benign neoplasm of sigmoid colon: Secondary | ICD-10-CM

## 2022-07-10 DIAGNOSIS — D123 Benign neoplasm of transverse colon: Secondary | ICD-10-CM | POA: Diagnosis not present

## 2022-07-10 DIAGNOSIS — K319 Disease of stomach and duodenum, unspecified: Secondary | ICD-10-CM | POA: Diagnosis not present

## 2022-07-10 DIAGNOSIS — K2970 Gastritis, unspecified, without bleeding: Secondary | ICD-10-CM

## 2022-07-10 DIAGNOSIS — D128 Benign neoplasm of rectum: Secondary | ICD-10-CM

## 2022-07-10 DIAGNOSIS — K649 Unspecified hemorrhoids: Secondary | ICD-10-CM

## 2022-07-10 LAB — CBC
HCT: 43.4 % (ref 39.0–52.0)
Hemoglobin: 14.6 g/dL (ref 13.0–17.0)
MCHC: 33.6 g/dL (ref 30.0–36.0)
MCV: 101.1 fl — ABNORMAL HIGH (ref 78.0–100.0)
Platelets: 53 10*3/uL — ABNORMAL LOW (ref 150.0–400.0)
RBC: 4.29 Mil/uL (ref 4.22–5.81)
RDW: 15.4 % (ref 11.5–15.5)
WBC: 5.1 10*3/uL (ref 4.0–10.5)

## 2022-07-10 MED ORDER — PANTOPRAZOLE SODIUM 40 MG PO TBEC: 40.0000 mg | DELAYED_RELEASE_TABLET | Freq: Two times a day (BID) | ORAL | 3 refills | Status: DC

## 2022-07-10 MED ORDER — SODIUM CHLORIDE 0.9 % IV SOLN: 500.0000 mL | INTRAVENOUS | Status: AC

## 2022-07-10 NOTE — Op Note (Signed)
[facility] Patient Name: [name] Procedure Date: 07/10/2022 2:15 PM MRN: [id number] Endoscopist: Gerrit Heck , MD, [id number] Age: 57 Referring MD:  Date of Birth: 1965-09-08 Gender: Male Account #: [id number] Procedure:                Colonoscopy Indications:              Screening for colorectal malignant neoplasm Medicines:                Monitored Anesthesia Care Procedure:                Pre-Anesthesia Assessment:                           - Prior to the procedure, a History and Physical                            was performed, and patient medications and                            allergies were reviewed. The patient's tolerance of                            previous anesthesia was also reviewed. The risks                            and benefits of the procedure and the sedation                            options and risks were discussed with the patient.                            All questions were answered, and informed consent                            was obtained. Prior Anticoagulants: The patient has                            taken no anticoagulant or antiplatelet agents. ASA                            Grade Assessment: III - A patient with severe                            systemic disease. After reviewing the risks and                            benefits, the patient was deemed in satisfactory                            condition to undergo the procedure.                           After obtaining informed consent, the colonoscope  was passed under direct vision. Throughout the                            procedure, the patient's blood pressure, pulse, and                            oxygen saturations were monitored continuously. The                            CF [id number] #[id number] was introduced through the anus                            and advanced to the the cecum, identified by                            appendiceal  orifice and ileocecal valve. The                            colonoscopy was performed without difficulty. The                            patient tolerated the procedure well. The quality                            of the bowel preparation was good. The ileocecal                            valve, appendiceal orifice, and rectum were                            photographed. Scope In: 2:58:03 PM Scope Out: 3:25:10 PM Scope Withdrawal Time: 0 hours 23 minutes 59 seconds  Total Procedure Duration: 0 hours 27 minutes 7 seconds  Findings:                 The perianal and digital rectal examinations were                            normal.                           Six sessile polyps were found in the descending                            colon (2), transverse colon (1), and ascending                            colon (3). The polyps were 3 to 6 mm in size. These                            polyps were removed with a cold snare. Resection                            and retrieval were complete. Estimated blood loss  was minimal.                           An 8 mm polyp was found in the sigmoid colon. The                            polyp was sessile. The polyp was removed with a                            cold snare. Resection and retrieval were complete.                            Estimated blood loss was minimal.                           Multiple sessile polyps were found in the rectum                            and recto-sigmoid colon. The polyps were 1 to 3 mm                            in size. Several of these polyps were removed with                            a cold snare for histologic representative                            evaluation. Resection and retrieval were complete.                            Estimated blood loss was minimal.                           A few small-mouthed diverticula were found in the                            ascending colon.                            Small, non-bleeding rectal varices were found.                           Retroflexion in the rectum was not performed due to                            anatomy (narrowed, short rectal vault). Complications:            No immediate complications. Estimated Blood Loss:     Estimated blood loss was minimal. Impression:               - Six 3 to 6 mm polyps in the descending colon, in                            the transverse colon and in the ascending colon,  removed with a cold snare. Resected and retrieved.                           - One 8 mm polyp in the sigmoid colon, removed with                            a cold snare. Resected and retrieved.                           - Multiple 1 to 3 mm polyps in the rectum and at                            the recto-sigmoid colon, removed with a cold snare.                            Resected and retrieved.                           - Diverticulosis in the ascending colon.                           - Rectal varices. Recommendation:           - Patient has a contact number available for                            emergencies. The signs and symptoms of potential                            delayed complications were discussed with the                            patient. Return to normal activities tomorrow.                            Written discharge instructions were provided to the                            patient.                           - Resume previous diet.                           - Continue present medications.                           - Await pathology results.                           - Repeat colonoscopy for surveillance based on                            pathology results.                           -  Return to GI office in 3-6 months or sooner as                            needed. Gerrit Heck, MD 07/10/2022 3:39:52 PM

## 2022-07-10 NOTE — Progress Notes (Signed)
Pt. States that he always moans when he moves, not related to any discomfort from his procedure today.  Parents present.
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
## 2022-07-10 NOTE — Progress Notes (Signed)
Report to pacu rn. Vss. Care resumed by rn. 

## 2022-07-10 NOTE — Patient Instructions (Addendum)
Impression/Recommendations:  Gastritis, polyp, and diverticulosis handouts given to patient.  Resume previous diet. Continue present medications.  Use Protonix (pantoprazole) 40 mg. 2 times day for 4 weeks, then reduce to 40 mg. Daily.  Await pathology results.  Repeat colonoscopy for surveillance based on pathology results.  Return to GI office in 3-6 months or sooner as needed.  YOU HAD AN ENDOSCOPIC PROCEDURE TODAY AT [facility]:   Refer to the procedure report that was given to you for any specific questions about what was found during the examination.  If the procedure report does not answer your questions, please call your gastroenterologist to clarify.  If you requested that your care partner not be given the details of your procedure findings, then the procedure report has been included in a sealed envelope for you to review at your convenience later.  YOU SHOULD EXPECT: Some feelings of bloating in the abdomen. Passage of more gas than usual.  Walking can help get rid of the air that was put into your GI tract during the procedure and reduce the bloating. If you had a lower endoscopy (such as a colonoscopy or flexible sigmoidoscopy) you may notice spotting of blood in your stool or on the toilet paper. If you underwent a bowel prep for your procedure, you may not have a normal bowel movement for a few days.  Please Note:  You might notice some irritation and congestion in your nose or some drainage.  This is from the oxygen used during your procedure.  There is no need for concern and it should clear up in a day or so.  SYMPTOMS TO REPORT IMMEDIATELY:  Following lower endoscopy (colonoscopy or flexible sigmoidoscopy):  Excessive amounts of blood in the stool  Significant tenderness or worsening of abdominal pains  Swelling of the abdomen that is new, acute  Fever of 100F or higher  Following upper endoscopy (EGD)  Vomiting of blood or coffee ground  material  New chest pain or pain under the shoulder blades  Painful or persistently difficult swallowing  New shortness of breath  Fever of 100F or higher  Black, tarry-looking stools  For urgent or emergent issues, a gastroenterologist can be reached at any hour by calling [phone]. Do not use MyChart messaging for urgent concerns.    DIET:  We do recommend a small meal at first, but then you may proceed to your regular diet.  Drink plenty of fluids but you should avoid alcoholic beverages for 24 hours.  ACTIVITY:  You should plan to take it easy for the rest of today and you should NOT DRIVE or use heavy machinery until tomorrow (because of the sedation medicines used during the test).    FOLLOW UP: Our staff will call the number listed on your records the next business day following your procedure.  We will call around 7:15- 8:00 am to check on you and address any questions or concerns that you may have regarding the information given to you following your procedure. If we do not reach you, we will leave a message.     If any biopsies were taken you will be contacted by phone or by letter within the next 1-3 weeks.  Please call us at [phone] if you have not heard about the biopsies in 3 weeks.    SIGNATURES/CONFIDENTIALITY: You and/or your care partner have signed paperwork which will be entered into your electronic medical record.  These signatures attest to the fact that that  the information above on your After Visit Summary has been reviewed and is understood.  Full responsibility of the confidentiality of this discharge information lies with you and/or your care-partner.

## 2022-07-10 NOTE — Op Note (Signed)
[facility] Patient Name: [name] Procedure Date: 07/10/2022 2:37 PM MRN: [id number] Endoscopist: Gerrit Heck , MD, [id number] Age: 57 Referring MD:  Date of Birth: 07-29-1965 Gender: Male Account #: [id number] Procedure:                Upper GI endoscopy Indications:              Cirrhosis rule out esophageal varices, Macrocytic                            anemia Medicines:                Monitored Anesthesia Care Procedure:                Pre-Anesthesia Assessment:                           - Prior to the procedure, a History and Physical                            was performed, and patient medications and                            allergies were reviewed. The patient's tolerance of                            previous anesthesia was also reviewed. The risks                            and benefits of the procedure and the sedation                            options and risks were discussed with the patient.                            All questions were answered, and informed consent                            was obtained. Prior Anticoagulants: The patient has                            taken no anticoagulant or antiplatelet agents. ASA                            Grade Assessment: III - A patient with severe                            systemic disease. After reviewing the risks and                            benefits, the patient was deemed in satisfactory                            condition to undergo the procedure.  After obtaining informed consent, the endoscope was                            passed under direct vision. Throughout the                            procedure, the patient's blood pressure, pulse, and                            oxygen saturations were monitored continuously. The                            GIF [id number] #[id number] was introduced through the                            mouth, and advanced to the second part of  duodenum.                            The upper GI endoscopy was accomplished without                            difficulty. The patient tolerated the procedure                            well. Scope In: Scope Out: Findings:                 The examined esophagus was normal.                           The Z-line was regular and was found 42 cm from the                            incisors.                           Diffuse moderate inflammation characterized by                            congestion (edema) and erythema was found in the                            entire examined stomach. Biopsies were taken with a                            cold forceps for histology. Estimated blood loss                            was minimal.                           The examined duodenum was normal. Biopsies were                            taken with a cold forceps for histology. Estimated  blood loss was minimal. Complications:            No immediate complications. Estimated Blood Loss:     Estimated blood loss was minimal. Impression:               - Normal esophagus.                           - Z-line regular, 42 cm from the incisors.                           - Gastritis. Biopsied.                           - Normal examined duodenum. Biopsied. Recommendation:           - Patient has a contact number available for                            emergencies. The signs and symptoms of potential                            delayed complications were discussed with the                            patient. Return to normal activities tomorrow.                            Written discharge instructions were provided to the                            patient.                           - Resume previous diet.                           - Use Protonix (pantoprazole) 40 mg PO BID for 4                            weeks, then reduce to 40 mg daily.                           - Await  pathology results.                           - Colonsocopy today. Gerrit Heck, MD 07/10/2022 3:32:34 PM

## 2022-07-10 NOTE — Progress Notes (Signed)
1620 Pt. Dressed, reported he had been cleaning up blood.  Wipes showed what appeared to be at least 3 tsp. BRB/clots.  Called Dr. Bryan Lemma to advise prior to pt. D/C from facility.  He reported bleeding was expected due to the location of rectal polyps. Pt. Cleared for D/C.  Pt. Has after hours no. With him for any concerns.

## 2022-07-10 NOTE — Progress Notes (Addendum)
Vital signs checked by:DT   The medical and surgical history was reviewed and verified with the patient.   Patient reports SOB on exertion for the last month.  Dr. Bryan Lemma, DO and A. Abbey Chatters, CRNA made aware.

## 2022-07-11 ENCOUNTER — Telehealth: Payer: Self-pay | Admitting: *Deleted

## 2022-07-11 NOTE — Telephone Encounter (Signed)
Patient states he is doing well and is not hurting. He will contact us if anything changes.

## 2022-07-11 NOTE — Telephone Encounter (Signed)
Post procedure follow up call placed, left VM.

## 2022-07-17 ENCOUNTER — Other Ambulatory Visit: Payer: Self-pay | Admitting: Student

## 2022-07-19 NOTE — Telephone Encounter (Signed)
Next appt scheduled 07/26/22 with PCP.

## 2022-07-20 ENCOUNTER — Other Ambulatory Visit: Payer: Self-pay | Admitting: Student

## 2022-07-20 DIAGNOSIS — F3342 Major depressive disorder, recurrent, in full remission: Secondary | ICD-10-CM

## 2022-07-21 NOTE — Telephone Encounter (Signed)
Next appt scheduled 07/26/22 with PCP.

## 2022-07-24 ENCOUNTER — Encounter: Payer: Self-pay | Admitting: Gastroenterology

## 2022-07-26 ENCOUNTER — Ambulatory Visit (INDEPENDENT_AMBULATORY_CARE_PROVIDER_SITE_OTHER): Payer: 59 | Admitting: Student

## 2022-07-26 ENCOUNTER — Encounter: Payer: Self-pay | Admitting: Student

## 2022-07-26 ENCOUNTER — Other Ambulatory Visit (HOSPITAL_COMMUNITY): Payer: Self-pay

## 2022-07-26 ENCOUNTER — Other Ambulatory Visit: Payer: Self-pay

## 2022-07-26 VITALS — BP 117/75 | HR 102 | Temp 99.0°F | Ht 67.0 in | Wt 144.7 lb

## 2022-07-26 DIAGNOSIS — F17210 Nicotine dependence, cigarettes, uncomplicated: Secondary | ICD-10-CM

## 2022-07-26 DIAGNOSIS — F1020 Alcohol dependence, uncomplicated: Secondary | ICD-10-CM

## 2022-07-26 DIAGNOSIS — R911 Solitary pulmonary nodule: Secondary | ICD-10-CM

## 2022-07-26 DIAGNOSIS — K7030 Alcoholic cirrhosis of liver without ascites: Secondary | ICD-10-CM

## 2022-07-26 DIAGNOSIS — F17200 Nicotine dependence, unspecified, uncomplicated: Secondary | ICD-10-CM

## 2022-07-26 MED ORDER — NICOTINE 21 MG/24HR TD PT24
21.0000 mg | MEDICATED_PATCH | TRANSDERMAL | 10 refills | Status: DC
  Filled 2022-07-26: qty 28, 28d supply, fill #0

## 2022-07-26 NOTE — Patient Instructions (Addendum)
Please set up a follow up with gastroenterology for your liver [facility] [phone] Please try to take naltrexone consistently to help stop drinking I will make a referral for behavioral health to set up counseling regarding this If you are interested you cal also try out alcoholic anonymous meeting in the area   Light lung nodule  It is important you work on smoking cessation I will send in nicotine patch for smoking cessation You have a repeat CT lung set up for 09/20/2022 for see if there are changes

## 2022-07-31 DIAGNOSIS — R911 Solitary pulmonary nodule: Secondary | ICD-10-CM | POA: Insufficient documentation

## 2022-07-31 NOTE — Assessment & Plan Note (Signed)
Recent MRCP with GI with dilated biliary duct. Denies symptoms of abdominal pain, nauseas, emesis, or, distention. Need to follow up in GI clinic. Contact information provided.

## 2022-07-31 NOTE — Assessment & Plan Note (Signed)
New irregular nodular opacity of the right lower lobe measuring 3.2 mm, possibly scarring related to prior infection although pulmonary nodule is also a concern. Follow-up CT is recommended. Lung-RADS 4AS, suspicious. Follow up low-dose chest CT without contrast in 3 months (please use the following order, CT CHEST LCS NODULE FOLLOW-UP W/O CM) is recommended. Alternatively, PET may be considered when there is a solid component 21m or larger. S modifier for coronary artery calcifications.

## 2022-08-01 ENCOUNTER — Ambulatory Visit (INDEPENDENT_AMBULATORY_CARE_PROVIDER_SITE_OTHER): Payer: 59 | Admitting: Licensed Clinical Social Worker

## 2022-08-01 DIAGNOSIS — F3341 Major depressive disorder, recurrent, in partial remission: Secondary | ICD-10-CM

## 2022-08-01 NOTE — BH Specialist Note (Deleted)
/.  Integrated Behavioral Health Initial In-Person Visit  MRN: [id number] Name: [name].  Number of Integrated Behavioral Health Clinician visits: No data recorded Session Start time: No data recorded   Session End time: No data recorded Total time in minutes: No data recorded  Types of Service: {CHL AMB TYPE OF SERVICE:7276385589}  Interpretor:{yes QI:297989} Interpretor Name and Language: ***   Warm Hand Off Completed.        Subjective: [name]. is a 57 y.o. male accompanied by {CHL AMB ACCOMPANIED QJ:1941740814} Patient was referred by *** for ***. Patient reports the following symptoms/concerns: *** Duration of problem: ***; Severity of problem: {Mild/Moderate/Severe:20260}  Objective: Mood: {BHH MOOD:22306} and Affect: {BHH AFFECT:22307} Risk of harm to self or others: {CHL AMB BH Suicide Current Mental Status:21022748}  Life Context: Family and Social: *** School/Work: *** Self-Care: *** Life Changes: ***  Patient and/or Family's Strengths/Protective Factors: {CHL AMB BH PROTECTIVE FACTORS:803 002 2029}  Goals Addressed: Patient will: Reduce symptoms of: {IBH Symptoms:21014056} Increase knowledge and/or ability of: {IBH Patient Tools:21014057}  Demonstrate ability to: {IBH Goals:21014053}  Progress towards Goals: {CHL AMB BH PROGRESS TOWARDS GOALS:[phone]}  Interventions: Interventions utilized: {IBH Interventions:21014054}  Standardized Assessments completed: {IBH Screening Tools:21014051}  Patient and/or Family Response: ***  Patient Centered Plan: Patient is on the following Treatment Plan(s):  ***  Assessment: Patient currently experiencing ***.   Patient may benefit from ***.  Plan: Follow up with behavioral health clinician on : *** Behavioral recommendations: *** Referral(s): {IBH Referrals:21014055} "From scale of 1-10, how likely are you to follow plan?": ***  Buford Dresser

## 2022-08-02 NOTE — Assessment & Plan Note (Addendum)
Reports he is no longer drinking liquor but has 2 40oz beers daily. Does not take his naltrexone regularly due to drinking. Has medications mailed and is affordable. Generally drinks when alone a home during the daytime. He expressed difficulty with cutting down on beer intake but is motivated to cut down due to his cirrhosis. Discussed importance of taking naltrexone regularly. Referral to [facility] to discuss alcohol cessation.  He will think about AA meetings. Follow up in 4 weeks.

## 2022-08-02 NOTE — Progress Notes (Signed)
Internal Medicine Clinic Attending ? ?Case discussed with Dr. Liang  At the time of the visit.  We reviewed the resident?s history and exam and pertinent patient test results.  I agree with the assessment, diagnosis, and plan of care documented in the resident?s note. ? ?

## 2022-08-02 NOTE — Assessment & Plan Note (Addendum)
Was using nicotine patches but stopped because insurance no longer covering this. Likely more affordable for him to get this at the [facility]. Currently smokes 1/2 ppd and smokes first thing in the morning. Has nicotine gum at home. Rx sent for nicotine patches.

## 2022-08-02 NOTE — Progress Notes (Addendum)
Established Patient Office Visit  Subjective   Patient ID: [name]., male    DOB: [birth year]  Age: 57 y.o. MRN: [id number]  Chief Complaint  Patient presents with   Follow-up    ROUTINE OFFICE VISIT     [name]. is a 57 y.o. person living with a history listed below who presents to clinic for follow up of low dose lung CT. Please refer to problem based charting for further details and assessment and plan of current problem and chronic medical conditions.     Patient Active Problem List   Diagnosis Date Noted   Lung nodule 07/31/2022   Night sweats 01/27/2022   Left knee pain 07/11/3233   Alcoholic cirrhosis ([place]) 57/32/2025   Shortness of breath 05/18/2021   Healthcare maintenance 01/18/2021   Abnormal chest x-ray 01/17/2021   Gynecomastia 01/17/2021   Heroin use disorder, mild, in early remission, on maintenance therapy, abuse ([place]) 01/17/2021   Hyperbilirubinemia 11/08/2020   Tachycardia 10/06/2020   Syncope 09/27/2020   Vitamin D deficiency 07/06/2020   Right hip pain 07/02/2020   Alcohol use disorder, moderate, dependence ([place]) 02/13/2020   Thrombocytopenia ([place]) 02/13/2020   Paresthesia 05/15/2019   Pain in both lower extremities 05/15/2019   Bowel incontinence 04/14/2019   Gait abnormality 04/14/2019   Myositis 02/06/2019   Macrocytic anemia 03/14/2018   Left leg pain 01/21/2017   Essential hypertension 01/21/2017   MDD (major depressive disorder) 03/25/2010   Erectile dysfunction 05/14/2008   GERD 04/13/2008   Tobacco use disorder 03/11/2008      ROS: negative as per HPI    Objective:     BP 117/75 (BP Location: Right Arm, Patient Position: Sitting, Cuff Size: Normal)   Pulse (!) 102   Temp 99 F (37.2 C) (Oral)   Ht '5\' 7"'$  (1.702 m)   Wt 144 lb 11.2 oz (65.6 kg)   SpO2 100%   BMI 22.66 kg/m  BP Readings from Last 3 Encounters:  07/26/22 117/75  07/10/22 (!) 148/91  05/19/22 (!) 150/90      Physical  Exam Constitutional:      Appearance: Normal appearance.  HENT:     Mouth/Throat:     Mouth: Mucous membranes are moist.     Pharynx: Oropharynx is clear.  Eyes:     Comments: Mild scleral icterus  Cardiovascular:     Rate and Rhythm: Normal rate and regular rhythm.     Pulses: Normal pulses.  Pulmonary:     Effort: Pulmonary effort is normal.     Breath sounds: No wheezing, rhonchi or rales.  Abdominal:     General: Abdomen is flat. Bowel sounds are normal. There is no distension.     Palpations: Abdomen is soft.     Tenderness: There is no abdominal tenderness. There is no guarding.  Musculoskeletal:        General: Normal range of motion.     Right lower leg: No edema.     Left lower leg: No edema.  Skin:    General: Skin is warm and dry.     Capillary Refill: Capillary refill takes less than 2 seconds.  Neurological:     General: No focal deficit present.     Mental Status: He is alert and oriented to person, place, and time.  Psychiatric:        Mood and Affect: Mood normal.        Behavior: Behavior normal.      No  results found for any visits on 07/26/22.  Last metabolic panel Lab Results  Component Value Date   GLUCOSE 92 05/19/2022   NA 140 05/19/2022   K 3.7 05/19/2022   CL 101 05/19/2022   CO2 31 05/19/2022   BUN 10 05/19/2022   CREATININE 0.82 05/19/2022   EGFR 93 02/27/2022   CALCIUM 8.8 05/19/2022   PHOS 2.9 09/01/2021   PROT 7.7 05/19/2022   ALBUMIN 4.0 05/19/2022   LABGLOB 3.6 02/27/2022   AGRATIO 1.3 02/27/2022   BILITOT 0.8 05/19/2022   ALKPHOS 140 (H) 05/19/2022   AST 73 (H) 05/19/2022   ALT 44 05/19/2022   ANIONGAP 7 02/16/2022      The 10-year ASCVD risk score (Arnett DK, et al., 2019) is: 16.2%    Assessment & Plan:   Problem List Items Addressed This Visit       Respiratory   Lung nodule    Low dose CT 06/21/2022: RLL irregular nodular opacity measuring 3.2 mm. Scarring vs pulmonary nodule. Lung-RADS 4AS, suspicious.  Follow CT CHEST LCS NODULE FOLLOW-UP W/O CM ordered for 09/20/2022. Discussed results with patient. Encouraged smoking cessation. See tobacco use for details.         Digestive   Alcoholic cirrhosis ([place]) - Primary    Recent MRCP with GI with dilated biliary duct. Denies symptoms of abdominal pain, nauseas, emesis, or, distention. Need to follow up in GI clinic. Contact information provided.       Relevant Orders   Ambulatory referral to [facility]     Other   Tobacco use disorder    Was using nicotine patches but stopped because insurance no longer covering this. Likely more affordable for him to get this at the [facility]. Currently smokes 1/2 ppd and smokes first thing in the morning. Has nicotine gum at home. Rx sent for nicotine patches.       Alcohol use disorder, moderate, dependence ([place])    Reports he is no longer drinking liquor but has 2 40oz beers daily. Does not take his naltrexone regularly due to drinking. Has medications mailed and is affordable. Generally drinks when alone a home during the daytime. He expressed difficulty with cutting down on beer intake but is motivated to cut down due to his cirrhosis. Discussed importance of taking naltrexone regularly. Referral to [facility] to discuss alcohol cessation.  He will think about AA meetings. Follow up in 4 weeks.       Return in about 4 weeks (around 08/23/2022).    Iona Beard, MD

## 2022-08-02 NOTE — Addendum Note (Signed)
Addended by: Charise Killian on: 08/02/2022 01:17 PM   Modules accepted: Level of Service

## 2022-08-03 ENCOUNTER — Other Ambulatory Visit (HOSPITAL_COMMUNITY): Payer: Self-pay

## 2022-08-14 ENCOUNTER — Other Ambulatory Visit: Payer: Self-pay | Admitting: Student

## 2022-08-14 DIAGNOSIS — F1020 Alcohol dependence, uncomplicated: Secondary | ICD-10-CM

## 2022-08-15 ENCOUNTER — Ambulatory Visit (INDEPENDENT_AMBULATORY_CARE_PROVIDER_SITE_OTHER): Payer: Medicaid Other | Admitting: Licensed Clinical Social Worker

## 2022-08-15 DIAGNOSIS — F3342 Major depressive disorder, recurrent, in full remission: Secondary | ICD-10-CM

## 2022-08-15 NOTE — BH Specialist Note (Signed)
Patient advised he was in the grocery store and not in a confidential area. Patient requested to reschedule appointment. [facility] will have appointment rescheduled.   Milus Height, MSW, [place]  Internal Medicine Center Direct Dial:[phone]  Fax [phone] Main Office Phone: [phone] [address] [place], [place] [address] Website: [place], [place]

## 2022-08-15 NOTE — Patient Instructions (Signed)
Visit Information  

## 2022-08-17 NOTE — BH Specialist Note (Signed)
Integrated Behavioral Health via Telemedicine Visit  08/01/2022 Krrish Roblyer [id number]  Number of [place] Clinician visits: 2- Second Visit  Session Start time: [id number]   Session End time: 1500 Total time in minutes: 30  Referring Provider: [name] Patient/Family location: Home [facility] Provider location: Office All persons participating in visit: Patient  Types of Service: Individual psychotherapy and Telephone visit  I connected with [facility].  via  Telephone  and verified that I am speaking with the correct person using two identifiers. Discussed confidentiality: Yes   I discussed the limitations of telemedicine and the availability of in person appointments.  Discussed there is a possibility of technology failure and discussed alternative modes of communication if that failure occurs.  I discussed that engaging in this telemedicine visit, they consent to the provision of behavioral healthcare and the services will be billed under their insurance.  Patient and/or legal guardian expressed understanding and consented to Telemedicine visit: Yes   Presenting Concerns: Patient and/or family reports the following symptoms/concerns: Alcohol use disorder     Goals Addressed: Patient will:  Demonstrate ability to: Decrease self-medicating behaviors  Progress towards Goals: Ongoing  Interventions: Interventions utilized:  Supportive Counseling and Link to [employer] Standardized Assessments completed: PHQ-SADS    07/26/2022    3:00 PM 02/27/2022    2:56 PM 02/15/2022    3:07 PM  PHQ-SADS Last 3 Score only  PHQ Adolescent Score 4 3      Information is confidential and restricted. Go to Review Flowsheets to unlock data.      Assessment:  Mr. Skellie is interested in outpatient or inpatient alcohol use disorder programs.  In the past there have been times where he is wanted to quit and then unfortunately started drinking  heavily again. [facility] and patient discussed available programs. 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 Patient declined outpatient programs at this time. [facility] will continue with motivational interviewing and patient agreed to continue to meet with [facility]. Patient stated he has slowed down with his alcohol usage and working with his PCP to control his urges.    Patient may benefit from motivational interviewing, continuing counseling.  Plan: Follow up with behavioral health clinician on : 08/15/22  I discussed the assessment and treatment plan with the patient and/or parent/guardian. They were provided an opportunity to ask questions and all were answered. They agreed with the plan and demonstrated an understanding of the instructions.   They were advised to call back or seek an in-person evaluation if the symptoms worsen or if the condition fails to improve as anticipated.  Milus Height, MSW, [place]  Internal Medicine Center Direct Dial:[phone]  Fax [phone] Main Office Phone: [phone] [address] [place], [place] [address] Website: [place], [place]

## 2022-08-23 ENCOUNTER — Ambulatory Visit (INDEPENDENT_AMBULATORY_CARE_PROVIDER_SITE_OTHER): Payer: Medicaid Other | Admitting: Licensed Clinical Social Worker

## 2022-08-23 DIAGNOSIS — F3341 Major depressive disorder, recurrent, in partial remission: Secondary | ICD-10-CM

## 2022-08-23 NOTE — BH Specialist Note (Signed)
Integrated Behavioral Health via Telemedicine Visit  08/23/2022 Yaziel Keding [id number]  Number of [place] Clinician visits: 2- Second Visit  Session Start time: [id number]   Session End time: 1600  Total time in minutes: 30   Referring Provider: PCPJohnney Ou Patient/Family location: Home [facility] Provider location: [facility]  All persons participating in visit: Patient only  Types of Service: Telephone visit  I connected with [facility].  via  Telephone and verified that I am speaking with the correct person using two identifiers. Discussed confidentiality: Yes   I discussed the limitations of telemedicine and the availability of in person appointments.  Discussed there is a possibility of technology failure and discussed alternative modes of communication if that failure occurs.   Patient and/or legal guardian expressed understanding and consented to Telemedicine visit: Yes   Assessment: Patient denied wanting Alcohol detox. Patient Unsure where to pick up his Nicotine patches. Patient admits to be motivated to stopping smoking. [facility] reviewed medications and advised patient of pharmacy information. Pharmacy placed patients medications back on shelf due to patient being delayed in picking up medications. Patient will schedule [employer] transportation and pick up meds. Patient advised he now has [employer].  Patient requested [facility] to follow up with him within a month.   Patient may benefit from Ongoing follow ups.  Plan: Follow up with behavioral health clinician on : within the next 90 days.    I discussed the assessment and treatment plan with the patient and/or parent/guardian. They were provided an opportunity to ask questions and all were answered. They agreed with the plan and demonstrated an understanding of the instructions.   They were advised to call back or seek an in-person evaluation if the symptoms worsen or if the condition fails to improve  as anticipated.  Milus Height, MSW, [place]  Internal Medicine Center Direct Dial:[phone]  Fax [phone] Main Office Phone: [phone] [address] [place], [place] [address] Website: [place], [place]

## 2022-08-24 ENCOUNTER — Other Ambulatory Visit: Payer: Self-pay

## 2022-08-24 DIAGNOSIS — M79605 Pain in left leg: Secondary | ICD-10-CM

## 2022-08-24 MED ORDER — ATORVASTATIN CALCIUM 40 MG PO TABS: 40.0000 mg | ORAL_TABLET | Freq: Every morning | ORAL | 1 refills | Status: DC

## 2022-08-29 ENCOUNTER — Encounter: Payer: Self-pay | Admitting: Gastroenterology

## 2022-09-19 ENCOUNTER — Ambulatory Visit (INDEPENDENT_AMBULATORY_CARE_PROVIDER_SITE_OTHER): Payer: Medicaid Other | Admitting: Licensed Clinical Social Worker

## 2022-09-19 ENCOUNTER — Other Ambulatory Visit: Payer: Self-pay | Admitting: Student

## 2022-09-19 DIAGNOSIS — F3341 Major depressive disorder, recurrent, in partial remission: Secondary | ICD-10-CM

## 2022-09-19 DIAGNOSIS — F1020 Alcohol dependence, uncomplicated: Secondary | ICD-10-CM

## 2022-09-19 NOTE — BH Specialist Note (Signed)
Patient no-showed today's appointment; appointment was for Telephone visit at 1:30pm  [facility] from here on out)  attempted patient twice via Telephone number [phone]   [facility] contacted patient from telephone number [phone]. [facility] unable to leave VM   Patient will need to reschedule appointment by calling Internal medicine center [phone].  Milus Height, MSW, [place]  Internal Medicine Center Direct [phone]  Fax [phone] Main Office Phone: [phone] [address] [place], [place] [address] Website: [place], [place]

## 2022-09-20 ENCOUNTER — Encounter (HOSPITAL_BASED_OUTPATIENT_CLINIC_OR_DEPARTMENT_OTHER): Payer: Self-pay

## 2022-09-20 ENCOUNTER — Ambulatory Visit (HOSPITAL_BASED_OUTPATIENT_CLINIC_OR_DEPARTMENT_OTHER)
Admission: RE | Admit: 2022-09-20 | Discharge: 2022-09-20 | Disposition: A | Discharge status: Home / Self Care | Payer: 59 | Source: Ambulatory Visit | Attending: Student in an Organized Health Care Education/Training Program | Admitting: Student in an Organized Health Care Education/Training Program

## 2022-09-20 DIAGNOSIS — R918 Other nonspecific abnormal finding of lung field: Secondary | ICD-10-CM

## 2022-09-21 ENCOUNTER — Other Ambulatory Visit: Payer: Self-pay | Admitting: Student

## 2022-09-21 DIAGNOSIS — F1020 Alcohol dependence, uncomplicated: Secondary | ICD-10-CM

## 2022-10-12 ENCOUNTER — Other Ambulatory Visit: Payer: Self-pay | Admitting: Student

## 2022-10-20 ENCOUNTER — Other Ambulatory Visit: Payer: Self-pay | Admitting: Student

## 2022-10-20 DIAGNOSIS — F1020 Alcohol dependence, uncomplicated: Secondary | ICD-10-CM

## 2022-11-21 ENCOUNTER — Emergency Department (HOSPITAL_COMMUNITY)
Admission: EM | Admit: 2022-11-21 | Discharge: 2022-11-21 | Disposition: A | Discharge status: Home / Self Care | Payer: 59 | Attending: Emergency Medicine | Admitting: Emergency Medicine

## 2022-11-21 ENCOUNTER — Emergency Department (HOSPITAL_COMMUNITY): Payer: 59

## 2022-11-21 ENCOUNTER — Other Ambulatory Visit: Payer: Self-pay

## 2022-11-21 ENCOUNTER — Encounter (HOSPITAL_COMMUNITY): Payer: Self-pay | Admitting: Pharmacy Technician

## 2022-11-21 DIAGNOSIS — M5136 Other intervertebral disc degeneration, lumbar region: Secondary | ICD-10-CM | POA: Diagnosis not present

## 2022-11-21 DIAGNOSIS — Z720 Tobacco use: Secondary | ICD-10-CM | POA: Insufficient documentation

## 2022-11-21 DIAGNOSIS — M545 Low back pain, unspecified: Secondary | ICD-10-CM | POA: Diagnosis present

## 2022-11-21 MED ORDER — CYCLOBENZAPRINE HCL 10 MG PO TABS: 10.0000 mg | ORAL_TABLET | Freq: Two times a day (BID) | ORAL | 0 refills | Status: DC | PRN

## 2022-11-21 MED ORDER — MORPHINE SULFATE (PF) 4 MG/ML IV SOLN
4.0000 mg | Freq: Once | INTRAVENOUS | Status: AC
  Administered 2022-11-21: 4 mg via INTRAVENOUS
  Filled 2022-11-21: qty 1

## 2022-11-21 NOTE — ED Triage Notes (Signed)
Pt bib ems from home with reports of RL back pain onset 3 hours ago. States feels like the pain is shooting down his R leg and R knee. Denies any recent falls or injury. VSS with ems. Given fentanyl en route.

## 2022-11-21 NOTE — Discharge Instructions (Signed)
Please follow-up with your primary care provider regarding recent symptoms and ER visit.  Today your CT did show degenerative disc disease and along with your physical exam you may have a muscle strain as well causing her pain.  You may take Tylenol every 6 hours needed for pain and use the Flexeril at night.  Please do not operate machinery or drive after taking the Flexeril as it may make you sedated.  If symptoms worsen please return to ER.

## 2022-11-21 NOTE — ED Provider Notes (Signed)
[place] AT [facility] Provider Note   CSN: [id number] Arrival date & time: 11/21/22  1804     History  Chief Complaint  Patient presents with   Back Pain    [name]. is a 57 y.o. male history of MDD, tobacco use, heroin use, thrombocytopenia, alcohol use disorder presented with back pain that has been present for 1 day.  Patient states the pain is on the left side of his back that radiates down both legs but is more prevalent in his left leg.  Patient arrived via EMS and received fentanyl.  Patient states he is still able to walk however in time he moves the pain is exacerbated.  Patient denies anesthesia, urinary/bowel continence, change in sensation/motor skills, night sweats, history of cancer, weight changes, fevers, hematuria, changes in urinary or bowel habits.  Patient denies history of IVDU but does state he has history of heroin use.  Patient had chest pain, shortness of breath, nausea/vomiting, changes sensation/motor skills, neck pain, headache, vision changes, new onset weakness  Home Medications Prior to Admission medications   Medication Sig Start Date End Date Taking? Authorizing Provider  cyclobenzaprine (FLEXERIL) 10 MG tablet Take 1 tablet (10 mg total) by mouth 2 (two) times daily as needed for muscle spasms. 11/21/22  Yes Netta Corrigan, PA-C  atorvastatin (LIPITOR) 40 MG tablet Take 1 tablet (40 mg total) by mouth every morning. 08/24/22   Belva Agee, MD  DULoxetine (CYMBALTA) 60 MG capsule TAKE ONE CAPSULE BY MOUTH ONCE DAILY 07/21/22   Belva Agee, MD  furosemide (LASIX) 40 MG tablet TAKE ONE TABLET BY MOUTH ONCE DAILY 10/20/22   [name], [name], MD  gabapentin (NEURONTIN) 300 MG capsule TAKE 1 CAPSULE BY MOUTH 3 TIMES  DAILY 07/19/22   [name], [name], MD  naltrexone (DEPADE) 50 MG tablet Take 1 tablet (50 mg total) by mouth daily. 04/18/22   [name], [name], MD  nicotine (NICODERM CQ  - DOSED IN MG/24 HOURS) 21 mg/24hr patch Place 1 patch (21 mg total) onto the skin daily. 07/26/22 07/26/23  Quincy Simmonds, MD  nicotine polacrilex (NICORETTE) 2 MG gum Take 1 each (2 mg total) by mouth as needed for smoking cessation. Patient not taking: Reported on 07/10/2022 06/29/22   Belva Agee, MD  pantoprazole (PROTONIX) 40 MG tablet Take 1 tablet (40 mg total) by mouth 2 (two) times daily. 07/10/22   Cirigliano, Vito V, DO  sildenafil (VIAGRA) 50 MG tablet TAKE 1/2 TABLET BY MOUTH ONE HOUR prior TO sexual activity as needed FOR erectile dysfunction 10/12/22   Belva Agee, MD  spironolactone (ALDACTONE) 100 MG tablet TAKE 1 TABLET BY MOUTH DAILY 08/15/22   Belva Agee, MD      Allergies    Patient has no known allergies.    Review of Systems   Review of Systems  Musculoskeletal:  Positive for back pain.    Physical Exam Updated Vital Signs BP 121/67 (BP Location: Right Arm)   Pulse 65   Temp 98.6 F (37 C) (Oral)   Resp 18   SpO2 96%  Physical Exam Vitals reviewed.  Constitutional:      General: He is not in acute distress.    Comments: Not in distress but does appear uncomfortable  HENT:     Head: Normocephalic and atraumatic.  Eyes:     Extraocular Movements: Extraocular movements intact.     Conjunctiva/sclera: Conjunctivae normal.     Pupils: Pupils are equal, round, and reactive  to light.  Cardiovascular:     Rate and Rhythm: Normal rate and regular rhythm.     Pulses: Normal pulses.     Heart sounds: Normal heart sounds.     Comments: 2+ bilateral radial/dorsalis pedis pulses with regular rate Pulmonary:     Effort: Pulmonary effort is normal. No respiratory distress.     Breath sounds: Normal breath sounds.  Abdominal:     Palpations: Abdomen is soft.     Tenderness: There is no abdominal tenderness. There is no guarding or rebound.  Musculoskeletal:        General: Normal range of motion.     Cervical back: Normal range of motion  and neck supple.     Comments: 5 out of 5 bilateral grip/leg extension strength Tender to palpation and left paralumbar region No midline tenderness Positive straight leg on the left side No step-off/crepitus/abnormalities palpated in spine/pelvis/lower extremities  Skin:    General: Skin is warm and dry.     Capillary Refill: Capillary refill takes less than 2 seconds.     Comments: No overlying skin color changes No track marks noted  Neurological:     General: No focal deficit present.     Mental Status: He is alert and oriented to person, place, and time.     Comments: Sensation intact in all 4 limbs  Psychiatric:        Mood and Affect: Mood normal.     ED Results / Procedures / Treatments   Labs (all labs ordered are listed, but only abnormal results are displayed) Labs Reviewed - No data to display  EKG None  Radiology CT Lumbar Spine Wo Contrast  Result Date: 11/21/2022 CLINICAL DATA:  Sudden onset of lumbar pain with radiation down to both legs. EXAM: CT LUMBAR SPINE WITHOUT CONTRAST TECHNIQUE: Multidetector CT imaging of the lumbar spine was performed without intravenous contrast administration. Multiplanar CT image reconstructions were also generated. RADIATION DOSE REDUCTION: This exam was performed according to the departmental dose-optimization program which includes automated exposure control, adjustment of the mA and/or kV according to patient size and/or use of iterative reconstruction technique. COMPARISON:  MRI examination dated April 01, 2020 FINDINGS: Segmentation:  5 non rib-bearing lumbar type vertebral bodies are present. The lowest fully formed vertebral body is L5. Alignment: Normal. Vertebrae: No acute fracture or focal pathologic process. Paraspinal and other soft tissues: Negative. Disc spaces: T12-L1: No significant findings. L1-L2: No significant findings L2-L3: Broad-based disc bulge with mild lateral recess stenosis. No significant neural foraminal  stenosis. Mild facet joint arthropathy. L3-L4: Moderate broad-based disc bulge with lateral recess stenosis bilaterally. Significant neural foraminal stenosis. Mild facet joint arthropathy. L4-L5: Broad-based circumferential disc bulge with moderate lateral recess stenosis bilaterally. Mild facet joint arthropathy. No significant neural foraminal stenosis. L5-S1: Mild disc bulge with lateral recess stenosis bilaterally. Mild facet joint arthropathy. No significant neural foraminal stenosis. IMPRESSION: 1. No acute fracture or malalignment. 2. Multilevel degenerative disc disease with lateral recess stenosis bilaterally at L3-L4, L4-L5 and L5-S1. Electronically Signed   By: Larose Hires D.O.   On: 11/21/2022 20:09    Procedures Procedures    Medications Ordered in ED Medications  morphine (PF) 4 MG/ML injection 4 mg (4 mg Intravenous Given 11/21/22 1939)    ED Course/ Medical Decision Making/ A&P                             Medical Decision Making Amount  and/or Complexity of Data Reviewed Radiology: ordered.  Risk Prescription drug management.   Georgena Spurling. 57 y.o. presented today for back pain. Working DDx that I considered at this time includes, but not limited to, MSK, underlying fracture, epidural hematoma, cauda equina syndrome, spinal stenosis, spinal malignancy, dural abscess, discitis, spinal infection.  R/o DDx: underlying fracture, epidural hematoma, cauda equina syndrome, spinal stenosis, spinal malignancy, dural abscess, discitis, spinal infection : less likely due to history of present illness and physical exam findings  Review of prior external notes: ED provider 02/13/2022  Unique Tests and My Interpretation:  CT lumbar spine without contrast: Degenerative disc disease noted, no fractures/dislocations/severe stenosis noted  Discussion with Independent Historian:  Parents  Discussion of Management of Tests: None  Risk: Medium: prescription drug  management  Risk Stratification Score: None  Plan: Patient presented for back pain. On exam patient was in no acute distress but did appear uncomfortable with stable vitals.  Patient did have tenderness to palpation in the left paralumbar region but had good pulse motor sensation and no abnormalities were palpated.  Patient does have history of heroin use however I did not note any track marks and patient denied any fevers/chills/changes in sensation or new onset weakness.  Due to patient appearing uncomfortable CT was ordered to rule out any life-threatening pathology which did show degenerative disc disease most likely causing patient's pain along with possible muscle strain as patient was tender in the muscular region.  Patient was given 1 dose of Dilaudid in the emergency department in which she did improve afterwards.  Patient did not endorse any red flag symptoms and will be given Flexeril and encouraged take Tylenol with primary care follow-up.  Patient was given return precautions. Patient stable for discharge at this time.  Patient verbalized understanding of plan.         Final Clinical Impression(s) / ED Diagnoses Final diagnoses:  Lumbar degenerative disc disease    Rx / DC Orders ED Discharge Orders          Ordered    cyclobenzaprine (FLEXERIL) 10 MG tablet  2 times daily PRN        11/21/22 2036              Remi Deter 11/21/22 2039    Glyn Ade, MD 11/22/22 [phone]

## 2022-11-21 NOTE — ED Notes (Signed)
Discharge instructions discussed with pt. Verbalized understanding. VSS. No questions or concerns regarding discharge  

## 2022-11-24 ENCOUNTER — Telehealth: Payer: Self-pay | Admitting: Internal Medicine

## 2022-11-24 NOTE — Telephone Encounter (Signed)
Call received to after-hours number.   Patient is calling with continued pain in back. Pain in lower back and radiates into both legs.  He was seen in ED on 5/21 for this. CT lumbar showed multilevel degenerative disc disease with lateral recess stenosis. He was discharged with cyclobenzaprine and tylenol.   P: Patient will follow-up in clinic if pain continues. Will continue cyclobenzaprine, tylenol, and ibuprofen for next 3 days. -cyclobenzaprine 10 mg BID PRN -tylenol 1000mg  q 6 hrs -ibuprofen 800 mg q 8 hrs for 3 days -trial of heating pad or ice

## 2022-12-05 ENCOUNTER — Encounter: Payer: Self-pay | Admitting: Student

## 2022-12-12 ENCOUNTER — Telehealth: Payer: Self-pay | Admitting: *Deleted

## 2022-12-12 DIAGNOSIS — R7989 Other specified abnormal findings of blood chemistry: Secondary | ICD-10-CM

## 2022-12-12 DIAGNOSIS — K7030 Alcoholic cirrhosis of liver without ascites: Secondary | ICD-10-CM

## 2022-12-12 NOTE — Telephone Encounter (Signed)
Lab orders entered in EPIC. U/S scheduled at [facility] on Thursday 12/21/22 at 10:00 am. Patient to be NPO 6 hours prior. Patient has been advised of u/s time/date/location/prep and returns this information back to me. He also states he will come for labs at our building following his ultrasound on 12/21/22.

## 2022-12-12 NOTE — Telephone Encounter (Signed)
-----   Message from Lucky Cowboy, RN sent at 06/06/2022 11:23 AM EST ----- Patient needs 6 month follow up AFP, LFT's & [place]. Order needs to be placed. Refer to imaging note 05/30/22.

## 2022-12-21 ENCOUNTER — Other Ambulatory Visit (INDEPENDENT_AMBULATORY_CARE_PROVIDER_SITE_OTHER): Payer: 59

## 2022-12-21 ENCOUNTER — Ambulatory Visit (HOSPITAL_COMMUNITY)
Admission: RE | Admit: 2022-12-21 | Discharge: 2022-12-21 | Disposition: A | Discharge status: Home / Self Care | Payer: 59 | Source: Ambulatory Visit | Attending: Gastroenterology | Admitting: Gastroenterology

## 2022-12-21 DIAGNOSIS — K7030 Alcoholic cirrhosis of liver without ascites: Secondary | ICD-10-CM | POA: Diagnosis present

## 2022-12-21 DIAGNOSIS — R7989 Other specified abnormal findings of blood chemistry: Secondary | ICD-10-CM

## 2022-12-21 LAB — HEPATIC FUNCTION PANEL
ALT: 30 U/L (ref 0–53)
AST: 82 U/L — ABNORMAL HIGH (ref 0–37)
Albumin: 4.1 g/dL (ref 3.5–5.2)
Alkaline Phosphatase: 156 U/L — ABNORMAL HIGH (ref 39–117)
Bilirubin, Direct: 0.3 mg/dL (ref 0.0–0.3)
Total Bilirubin: 1 mg/dL (ref 0.2–1.2)
Total Protein: 8.6 g/dL — ABNORMAL HIGH (ref 6.0–8.3)

## 2022-12-22 ENCOUNTER — Telehealth: Payer: Self-pay

## 2022-12-22 DIAGNOSIS — K7030 Alcoholic cirrhosis of liver without ascites: Secondary | ICD-10-CM

## 2022-12-22 DIAGNOSIS — R9389 Abnormal findings on diagnostic imaging of other specified body structures: Secondary | ICD-10-CM

## 2022-12-22 DIAGNOSIS — R7989 Other specified abnormal findings of blood chemistry: Secondary | ICD-10-CM

## 2022-12-22 LAB — AFP TUMOR MARKER: AFP-Tumor Marker: 6.8 ng/mL — ABNORMAL HIGH (ref <6.1)

## 2022-12-22 NOTE — Telephone Encounter (Signed)
-----   Message from Brainerd V, DO sent at 12/22/2022  2:31 PM EDT ----- Repeat liver enzymes are largely stable from previous but do represent an overall uptrend when looking at the last year of data, with AST 82, ALP 156. Otherwise normal Tbili and Albumin. Will follow-up on pending AFP.  Separately, abdominal ultrasound results reviewed as well. There  is a 1.4 x 0.9 x 0.8 cm hyperechoic mass in the liver. This appears to be new compared with previous imaging studies. The liver is cirrhotic appearing and the CBD dilated at 10.7 mm, which is stable from previous.   Please call patient to review and set up for MRI 3-phase liver without and with contrast

## 2022-12-22 NOTE — Telephone Encounter (Signed)
Patient made aware. Informed patient that Dr. Barron Alvine recommended MRI to better understanding of Mass in the liver shown in his [place]. Advised him that I will call him on Monday with an appointment after scheduling his MRI at [facility]. Patient verbalized understanding. MRI is already ordered in  EPIC.

## 2022-12-25 NOTE — Addendum Note (Signed)
Addended by: Coletta Memos on: 12/25/2022 01:43 PM   Modules accepted: Orders

## 2022-12-25 NOTE — Telephone Encounter (Signed)
Patient made aware that he is scheduled for MRI at [facility] on 01/05/23 at 7 AM. Patient is advised to be at the Registration at 6:45 AM and nothing to eat or drink after 3:00 AM that morning. Address provided. Patient verbalized understanding with all questions answered.

## 2022-12-29 ENCOUNTER — Other Ambulatory Visit: Payer: Self-pay

## 2022-12-29 DIAGNOSIS — F1020 Alcohol dependence, uncomplicated: Secondary | ICD-10-CM

## 2022-12-29 DIAGNOSIS — F3342 Major depressive disorder, recurrent, in full remission: Secondary | ICD-10-CM

## 2022-12-29 DIAGNOSIS — M79605 Pain in left leg: Secondary | ICD-10-CM

## 2022-12-29 MED ORDER — NALTREXONE HCL 50 MG PO TABS: 50.0000 mg | ORAL_TABLET | Freq: Every day | ORAL | 1 refills | Status: DC

## 2022-12-29 MED ORDER — SPIRONOLACTONE 100 MG PO TABS: 100.0000 mg | ORAL_TABLET | Freq: Every day | ORAL | 3 refills | Status: DC

## 2022-12-29 MED ORDER — FUROSEMIDE 40 MG PO TABS: 40.0000 mg | ORAL_TABLET | Freq: Every day | ORAL | 3 refills | Status: DC

## 2022-12-29 MED ORDER — DULOXETINE HCL 60 MG PO CPEP: 60.0000 mg | ORAL_CAPSULE | Freq: Every day | ORAL | 3 refills | Status: DC

## 2022-12-29 MED ORDER — ATORVASTATIN CALCIUM 40 MG PO TABS: 40.0000 mg | ORAL_TABLET | Freq: Every morning | ORAL | 3 refills | Status: DC

## 2022-12-29 MED ORDER — NICOTINE POLACRILEX 2 MG MT GUM: 2.0000 mg | CHEWING_GUM | OROMUCOSAL | 0 refills | Status: DC | PRN

## 2022-12-29 MED ORDER — GABAPENTIN 300 MG PO CAPS: 300.0000 mg | ORAL_CAPSULE | Freq: Three times a day (TID) | ORAL | 3 refills | Status: DC

## 2022-12-29 MED ORDER — NICOTINE 21 MG/24HR TD PT24: 21.0000 mg | MEDICATED_PATCH | TRANSDERMAL | 10 refills | Status: AC

## 2022-12-29 MED ORDER — PANTOPRAZOLE SODIUM 40 MG PO TBEC: 40.0000 mg | DELAYED_RELEASE_TABLET | Freq: Two times a day (BID) | ORAL | 3 refills | Status: DC

## 2022-12-29 MED ORDER — SILDENAFIL CITRATE 50 MG PO TABS: ORAL_TABLET | ORAL | 2 refills | Status: DC

## 2022-12-29 NOTE — Telephone Encounter (Signed)
Patient has switched pharmacies to [facility] [place], [place] - [address] and is requesting all of his medications to be refilled

## 2023-01-05 ENCOUNTER — Encounter: Payer: Self-pay | Admitting: Gastroenterology

## 2023-01-05 ENCOUNTER — Ambulatory Visit (HOSPITAL_COMMUNITY): Payer: 59

## 2023-01-17 ENCOUNTER — Ambulatory Visit (HOSPITAL_COMMUNITY)
Admission: RE | Admit: 2023-01-17 | Discharge: 2023-01-17 | Disposition: A | Discharge status: Home / Self Care | Payer: 59 | Source: Ambulatory Visit | Attending: Gastroenterology | Admitting: Gastroenterology

## 2023-01-17 DIAGNOSIS — R9389 Abnormal findings on diagnostic imaging of other specified body structures: Secondary | ICD-10-CM | POA: Diagnosis present

## 2023-01-17 DIAGNOSIS — K7030 Alcoholic cirrhosis of liver without ascites: Secondary | ICD-10-CM | POA: Insufficient documentation

## 2023-01-17 DIAGNOSIS — R7989 Other specified abnormal findings of blood chemistry: Secondary | ICD-10-CM | POA: Insufficient documentation

## 2023-01-17 MED ORDER — GADOBUTROL 1 MMOL/ML IV SOLN
6.5000 mL | Freq: Once | INTRAVENOUS | Status: AC | PRN
  Administered 2023-01-17: 6.5 mL via INTRAVENOUS

## 2023-01-18 ENCOUNTER — Emergency Department (HOSPITAL_COMMUNITY)
Admission: EM | Admit: 2023-01-18 | Discharge: 2023-01-18 | Disposition: A | Discharge status: Home / Self Care | Payer: 59 | Attending: Emergency Medicine | Admitting: Emergency Medicine

## 2023-01-18 DIAGNOSIS — R160 Hepatomegaly, not elsewhere classified: Secondary | ICD-10-CM

## 2023-01-18 DIAGNOSIS — R519 Headache, unspecified: Secondary | ICD-10-CM | POA: Insufficient documentation

## 2023-01-18 DIAGNOSIS — F10239 Alcohol dependence with withdrawal, unspecified: Secondary | ICD-10-CM | POA: Diagnosis not present

## 2023-01-18 DIAGNOSIS — R110 Nausea: Secondary | ICD-10-CM | POA: Insufficient documentation

## 2023-01-18 DIAGNOSIS — Z79899 Other long term (current) drug therapy: Secondary | ICD-10-CM | POA: Insufficient documentation

## 2023-01-18 DIAGNOSIS — R251 Tremor, unspecified: Secondary | ICD-10-CM | POA: Insufficient documentation

## 2023-01-18 DIAGNOSIS — R7401 Elevation of levels of liver transaminase levels: Secondary | ICD-10-CM | POA: Insufficient documentation

## 2023-01-18 DIAGNOSIS — F10930 Alcohol use, unspecified with withdrawal, uncomplicated: Secondary | ICD-10-CM

## 2023-01-18 DIAGNOSIS — Y900 Blood alcohol level of less than 20 mg/100 ml: Secondary | ICD-10-CM | POA: Insufficient documentation

## 2023-01-18 DIAGNOSIS — R7989 Other specified abnormal findings of blood chemistry: Secondary | ICD-10-CM

## 2023-01-18 LAB — CBC WITH DIFFERENTIAL/PLATELET
Abs Immature Granulocytes: 0.02 10*3/uL (ref 0.00–0.07)
Basophils Absolute: 0 10*3/uL (ref 0.0–0.1)
Basophils Relative: 0 %
Eosinophils Absolute: 0 10*3/uL (ref 0.0–0.5)
Eosinophils Relative: 0 %
HCT: 36.7 % — ABNORMAL LOW (ref 39.0–52.0)
Hemoglobin: 12.3 g/dL — ABNORMAL LOW (ref 13.0–17.0)
Immature Granulocytes: 1 %
Lymphocytes Relative: 15 %
Lymphs Abs: 0.6 10*3/uL — ABNORMAL LOW (ref 0.7–4.0)
MCH: 34.1 pg — ABNORMAL HIGH (ref 26.0–34.0)
MCHC: 33.5 g/dL (ref 30.0–36.0)
MCV: 101.7 fL — ABNORMAL HIGH (ref 80.0–100.0)
Monocytes Absolute: 0.5 10*3/uL (ref 0.1–1.0)
Monocytes Relative: 13 %
Neutro Abs: 3 10*3/uL (ref 1.7–7.7)
Neutrophils Relative %: 71 %
Platelets: 38 10*3/uL — ABNORMAL LOW (ref 150–400)
RBC: 3.61 MIL/uL — ABNORMAL LOW (ref 4.22–5.81)
RDW: 14.6 % (ref 11.5–15.5)
WBC: 4.2 10*3/uL (ref 4.0–10.5)
nRBC: 0.5 % — ABNORMAL HIGH (ref 0.0–0.2)

## 2023-01-18 LAB — ETHANOL: Alcohol, Ethyl (B): <10 mg/dL (ref <10)

## 2023-01-18 LAB — COMPREHENSIVE METABOLIC PANEL
ALT: 78 U/L — ABNORMAL HIGH (ref 0–44)
AST: 202 U/L — ABNORMAL HIGH (ref 15–41)
Albumin: 3.5 g/dL (ref 3.5–5.0)
Alkaline Phosphatase: 150 U/L — ABNORMAL HIGH (ref 38–126)
Anion gap: 8 (ref 5–15)
BUN: <5 mg/dL — ABNORMAL LOW (ref 6–20)
CO2: 27 mmol/L (ref 22–32)
Calcium: 8.5 mg/dL — ABNORMAL LOW (ref 8.9–10.3)
Chloride: 99 mmol/L (ref 98–111)
Creatinine, Ser: 0.58 mg/dL — ABNORMAL LOW (ref 0.61–1.24)
GFR, Estimated: >60 mL/min (ref >60)
Glucose, Bld: 105 mg/dL — ABNORMAL HIGH (ref 70–99)
Potassium: 3.4 mmol/L — ABNORMAL LOW (ref 3.5–5.1)
Sodium: 134 mmol/L — ABNORMAL LOW (ref 135–145)
Total Bilirubin: 2.7 mg/dL — ABNORMAL HIGH (ref 0.3–1.2)
Total Protein: 7.5 g/dL (ref 6.5–8.1)

## 2023-01-18 LAB — SALICYLATE LEVEL: Salicylate Lvl: <7 mg/dL — ABNORMAL LOW (ref 7.0–30.0)

## 2023-01-18 LAB — LIPASE, BLOOD: Lipase: 36 U/L (ref 11–51)

## 2023-01-18 LAB — MAGNESIUM: Magnesium: 2 mg/dL (ref 1.7–2.4)

## 2023-01-18 LAB — ACETAMINOPHEN LEVEL: Acetaminophen (Tylenol), Serum: <10 ug/mL — ABNORMAL LOW (ref 10–30)

## 2023-01-18 MED ORDER — CHLORDIAZEPOXIDE HCL 25 MG PO CAPS: ORAL_CAPSULE | ORAL | 0 refills | Status: DC

## 2023-01-18 MED ORDER — SODIUM CHLORIDE 0.9 % IV BOLUS
1000.0000 mL | Freq: Once | INTRAVENOUS | Status: AC
  Administered 2023-01-18: 1000 mL via INTRAVENOUS

## 2023-01-18 MED ORDER — ONDANSETRON HCL 4 MG/2ML IJ SOLN
4.0000 mg | Freq: Once | INTRAMUSCULAR | Status: AC
  Administered 2023-01-18: 4 mg via INTRAVENOUS
  Filled 2023-01-18: qty 2

## 2023-01-18 MED ORDER — LORAZEPAM 1 MG PO TABS
1.0000 mg | ORAL_TABLET | Freq: Once | ORAL | Status: AC
  Administered 2023-01-18: 1 mg via ORAL
  Filled 2023-01-18: qty 1

## 2023-01-18 MED ORDER — ONDANSETRON 4 MG PO TBDP: 4.0000 mg | ORAL_TABLET | Freq: Three times a day (TID) | ORAL | 0 refills | Status: DC | PRN

## 2023-01-18 MED ORDER — SODIUM CHLORIDE (PF) 0.9 % IJ SOLN
INTRAMUSCULAR | Status: AC
  Filled 2023-01-18: qty 50

## 2023-01-18 NOTE — ED Triage Notes (Addendum)
Pt BIBA from home after attempting to personally detox himself from ETOH abuse and started having nausea and H/A. Pt states that his last drink was x2days ago.CIWA 8 during triage d/T H/A , nausea, & mild tremors.

## 2023-01-18 NOTE — ED Provider Notes (Signed)
[place] [facility]ERGENCY DEPARTMENT AT [facility] Provider Note   CSN: [id number] Arrival date & time: 01/18/23  0510     History  Chief Complaint  Patient presents with   Alcohol Problem    Gerlad Pelzel. is a 57 y.o. male history of EtOH abuse, elevated LFTs, cirrhosis here for evaluation of alcohol withdrawal.  Last drink of alcohol 2 days ago.  Denies prior history of DTs, withdrawal seizures.  He drinks approximately 5-6 beers daily.  He has had some nausea as well as some tremors.  Has a mild headache.  Denies any recent falls or injuries.  No vision changes, numbness, weakness, chest pain, shortness of breath, abdominal pain, emesis.  Denies SI, HI, AVH.  Denies any anxiety.  States he has never had to come off alcohol previously.  He is currently being worked up for elevated LFTs had an MRI yesterday evening here outpatient at [facility], does not know the results.  Denies any sudden onset headache, numbness, weakness.  No slurred speech.  No neck pain, vision changes  HPI     Home Medications Prior to Admission medications   Medication Sig Start Date End Date Taking? Authorizing Provider  chlordiazePOXIDE (LIBRIUM) 25 MG capsule 50mg  PO TID x 1D, then 25-50mg  PO BID X 1D, then 25-50mg  PO QD X 1D 01/18/23  Yes Dontay Harm A, PA-C  ondansetron (ZOFRAN-ODT) 4 MG disintegrating tablet Take 1 tablet (4 mg total) by mouth every 8 (eight) hours as needed for nausea or vomiting. 01/18/23  Yes Trayvon Trumbull A, PA-C  atorvastatin (LIPITOR) 40 MG tablet Take 1 tablet (40 mg total) by mouth every morning. 12/29/22   Rocky Morel, DO  cyclobenzaprine (FLEXERIL) 10 MG tablet Take 1 tablet (10 mg total) by mouth 2 (two) times daily as needed for muscle spasms. 11/21/22   Netta Corrigan, PA-C  DULoxetine (CYMBALTA) 60 MG capsule Take 1 capsule (60 mg total) by mouth daily. 12/29/22   Rocky Morel, DO  furosemide (LASIX) 40 MG tablet Take 1 tablet (40 mg total)  by mouth daily. 12/29/22   Rocky Morel, DO  gabapentin (NEURONTIN) 300 MG capsule Take 1 capsule (300 mg total) by mouth 3 (three) times daily. 12/29/22   Rocky Morel, DO  naltrexone (DEPADE) 50 MG tablet Take 1 tablet (50 mg total) by mouth daily. 12/29/22   Rocky Morel, DO  nicotine (NICODERM CQ - DOSED IN MG/24 HOURS) 21 mg/24hr patch Place 1 patch (21 mg total) onto the skin daily. 12/29/22 12/29/23  Rocky Morel, DO  nicotine polacrilex (NICORETTE) 2 MG gum Take 1 each (2 mg total) by mouth as needed for smoking cessation. 12/29/22   Rocky Morel, DO  pantoprazole (PROTONIX) 40 MG tablet Take 1 tablet (40 mg total) by mouth 2 (two) times daily. 12/29/22   Rocky Morel, DO  sildenafil (VIAGRA) 50 MG tablet TAKE 1/2 TABLET BY MOUTH ONE HOUR prior TO sexual activity as needed FOR erectile dysfunction 12/29/22   Rocky Morel, DO  spironolactone (ALDACTONE) 100 MG tablet Take 1 tablet (100 mg total) by mouth daily. 12/29/22   Rocky Morel, DO      Allergies    Patient has no known allergies.    Review of Systems   Review of Systems  Constitutional: Negative.   HENT: Negative.    Respiratory: Negative.    Cardiovascular: Negative.   Gastrointestinal:  Positive for nausea. Negative for abdominal distention, abdominal pain, anal bleeding, blood in stool, constipation, diarrhea,  rectal pain and vomiting.  Genitourinary: Negative.   Musculoskeletal: Negative.   Skin: Negative.   Neurological:  Positive for headaches.  All other systems reviewed and are negative.   Physical Exam Updated Vital Signs BP 128/87   Pulse 84   Temp 97.9 F (36.6 C)   Resp 14   SpO2 96%  Physical Exam Vitals and nursing note reviewed.  Constitutional:      General: He is not in acute distress.    Appearance: He is well-developed. He is not ill-appearing, toxic-appearing or diaphoretic.  HENT:     Head: Normocephalic and atraumatic.     Nose: Nose normal.     Mouth/Throat:     Mouth:  Mucous membranes are moist.  Eyes:     General: Scleral icterus present.     Pupils: Pupils are equal, round, and reactive to light.     Comments: Mild scleral icterus  Cardiovascular:     Rate and Rhythm: Normal rate and regular rhythm.     Pulses: Normal pulses.          Radial pulses are 2+ on the right side and 2+ on the left side.       Dorsalis pedis pulses are 2+ on the right side and 2+ on the left side.     Heart sounds: Normal heart sounds.  Pulmonary:     Effort: Pulmonary effort is normal. No respiratory distress.     Breath sounds: Normal breath sounds and air entry.     Comments: Clear bilaterally, speaks in full sentences without difficulty Abdominal:     General: Bowel sounds are normal. There is no distension.     Palpations: Abdomen is soft.     Tenderness: There is no abdominal tenderness. There is no right CVA tenderness, left CVA tenderness, guarding or rebound.     Comments: Soft, nontender, no rebound or guarding.  No fluid wave  Musculoskeletal:        General: No swelling, tenderness, deformity or signs of injury. Normal range of motion.     Cervical back: Normal range of motion and neck supple.     Right lower leg: No edema.     Left lower leg: No edema.     Comments: Mild tremors when holding hands straight out in front.  Negative asterixis no bony tenderness, full range of motion.  Compartment soft  Skin:    General: Skin is warm and dry.  Neurological:     General: No focal deficit present.     Mental Status: He is alert and oriented to person, place, and time.     Cranial Nerves: Cranial nerves 2-12 are intact. No cranial nerve deficit.     Sensory: Sensation is intact. No sensory deficit.     Motor: Motor function is intact. No weakness.     Coordination: Coordination is intact.     Comments: Cranial nerves II through XII grossly intact Equal strength bilaterally Intact sensation Negative Romberg, heel-to-shin  Psychiatric:        Mood and  Affect: Mood normal.        Behavior: Behavior normal.        Thought Content: Thought content normal.        Judgment: Judgment normal.     ED Results / Procedures / Treatments   Labs (all labs ordered are listed, but only abnormal results are displayed) Labs Reviewed  CBC WITH DIFFERENTIAL/PLATELET - Abnormal; Notable for the following components:  Result Value   RBC 3.61 (*)    Hemoglobin 12.3 (*)    HCT 36.7 (*)    MCV 101.7 (*)    MCH 34.1 (*)    Platelets 38 (*)    nRBC 0.5 (*)    Lymphs Abs 0.6 (*)    All other components within normal limits  COMPREHENSIVE METABOLIC PANEL - Abnormal; Notable for the following components:   Sodium 134 (*)    Potassium 3.4 (*)    Glucose, Bld 105 (*)    BUN <5 (*)    Creatinine, Ser 0.58 (*)    Calcium 8.5 (*)    AST 202 (*)    ALT 78 (*)    Alkaline Phosphatase 150 (*)    Total Bilirubin 2.7 (*)    All other components within normal limits  ACETAMINOPHEN LEVEL - Abnormal; Notable for the following components:   Acetaminophen (Tylenol), Serum <10 (*)    All other components within normal limits  SALICYLATE LEVEL - Abnormal; Notable for the following components:   Salicylate Lvl <7.0 (*)    All other components within normal limits  ETHANOL  LIPASE, BLOOD  MAGNESIUM    EKG None  Radiology MR Abdomen W Wo Contrast  Result Date: 01/18/2023 CLINICAL DATA:  Cirrhosis, elevated LFTs, suspected mass identified by prior ultrasound EXAM: MRI ABDOMEN WITHOUT AND WITH CONTRAST TECHNIQUE: Multiplanar multisequence MR imaging of the abdomen was performed both before and after the administration of intravenous contrast. CONTRAST:  6.67mL GADAVIST GADOBUTROL 1 MMOL/ML IV SOLN COMPARISON:  Right upper quadrant ultrasound, 12/21/2022 FINDINGS: Lower chest: No acute abnormality. Hepatobiliary: Coarse, nodular cirrhotic morphology of the liver with diffusely heterogeneous reticular and nodular contrast enhancement throughout. No arterially  hyperenhancing lesions. There is a rim hypoenhancing lesion of the anterior liver dome, hepatic segment VIII, measuring 2.3 x 2.0 cm (series 13, image 16). No gallstones, gallbladder wall thickening, or biliary dilatation. Pancreas: Unremarkable. No pancreatic ductal dilatation or surrounding inflammatory changes. Spleen: Normal in size without significant abnormality. Adrenals/Urinary Tract: Adrenal glands are unremarkable. Kidneys are normal, without renal calculi, solid lesion, or hydronephrosis. Stomach/Bowel: Stomach is within normal limits. No evidence of bowel wall thickening, distention, or inflammatory changes. Vascular/Lymphatic: Aortic atherosclerosis. No enlarged abdominal lymph nodes. Other: No abdominal wall hernia or abnormality. No ascites. Musculoskeletal: No acute or significant osseous findings. IMPRESSION: 1. Cirrhotic morphology of the liver with diffusely heterogeneous reticular and nodular contrast enhancement throughout. No arterially hyperenhancing lesions. 2. Rim hypoenhancing lesion of the anterior liver dome, hepatic segment VIII, measuring 2.3 x 2.0 cm. This is highly suspicious for malignancy although with contrast enhancement characteristics not typical of hepatocellular carcinoma, suspicious for intrahepatic cholangiocarcinoma. LI-RADS category M. 3. No evidence of lymphadenopathy or metastatic disease in the abdomen. These results will be called to the ordering clinician or representative by the Radiologist Assistant, and communication documented in the PACS or [employer]. Aortic Atherosclerosis (ICD10-I70.0). Electronically Signed   By: Jearld Lesch M.D.   On: 01/18/2023 08:55    Procedures Procedures    Medications Ordered in ED Medications  sodium chloride 0.9 % bolus 1,000 mL (0 mLs Intravenous Stopped 01/18/23 0844)  ondansetron (ZOFRAN) injection 4 mg (4 mg Intravenous Given 01/18/23 0728)  LORazepam (ATIVAN) tablet 1 mg (1 mg Oral Given 01/18/23 0722)   ondansetron (ZOFRAN) injection 4 mg (4 mg Intravenous Given 01/18/23 1111)    ED Course/ Medical Decision Making/ A&P Clinical Course as of 01/18/23 1252  Thu Jan 18, 2023  1610  Elevated LFTs, history of prior followed by GI however increased from baseline.  He had MRI approximately 12 hours ago.  Not read in PACS, discussed with radiology reading room will push to read today while he is in the ED.  No abdominal pain. [BH]    Clinical Course User Index [BH] Rally Ouch A, PA-C   57 year old known history of EtOH abuse, chronically elevated LFTs following by GI here for evaluation alcohol withdrawal, last use 2 days ago.  He has some mild headache, nausea as well as some intentional tremors.  He denies any prior history of DTs or withdrawal seizures.  Had MRI yesterday outpatient [facility] for mass found on his liver, does not know the results.  On arrival patient afebrile, nonseptic, not ill-appearing. Initial CIWA 8, received 1 mg Ativan, repeat CIWA 1 for nausea.  Labs and Imaging personally viewed and interpreted:  CBC without leukocytosis, hemoglobin 12.3 Metabolic panel elevated LFTs, up from prior Lipase 36 Magnesium 2.0 Acetaminophen, salicylate, ethanol within normal limits  I discussed with radiology reading room, will push his MRI to be read today while he is here in the emergency department  MRI shows suspicion for cholangiocarcinoma  Discussed with Amy Esterwood with [facility] gastroenterology.  Will discuss with team his MRI findings  Per recommendation from GI patient does not need to be admitted based off of their service.  With regards to his headache he has a nonfocal neuroexam without deficits.  No recent injury or trauma.  I have low suspicion for CVA, bleed/ SAH, demyelinating process, malignant process, venous sinus thrombosis, acute angle glaucoma, trigeminal neuralgia, giant cell arteritis.  His abdomen is soft, nontender.  Low suspicion for acute  intra-abdominal process causing his nausea such as infectious process, obstruction, incarceration, strangulation, perforated viscus, SBP.  Discussed plan with patient.  Patient does not need to be admitted for alcohol withdrawal at this time.  DC home with Librium prescription as well as Zofran.  I encouraged sensation of alcohol while taking this medication.  With regards to his mass on his liver.  Gastroenterology will follow-up with him outpatient, I did place urgent referral to oncology for expedited evaluation.  He will return for any worsening symptoms.  Tolerating p.o. intake here.  The patient has been appropriately medically screened and/or stabilized in the ED. I have low suspicion for any other emergent medical condition which would require further screening, evaluation or treatment in the ED or require inpatient management.  Patient is hemodynamically stable and in no acute distress.  Patient able to ambulate in department prior to ED.  Evaluation does not show acute pathology that would require ongoing or additional emergent interventions while in the emergency department or further inpatient treatment.  I have discussed the diagnosis with the patient and answered all questions.  Pain is been managed while in the emergency department and patient has no further complaints prior to discharge.  Patient is comfortable with plan discussed in room and is stable for discharge at this time.  I have discussed strict return precautions for returning to the emergency department.  Patient was encouraged to follow-up with PCP/specialist refer to at discharge.                             Medical Decision Making Amount and/or Complexity of Data Reviewed External Data Reviewed: labs, radiology and notes. Labs: ordered. Decision-making details documented in ED Course. Radiology: ordered and independent interpretation performed. Decision-making  details documented in ED Course.  Risk OTC  drugs. Prescription drug management. Parenteral controlled substances. Decision regarding hospitalization. Diagnosis or treatment significantly limited by social determinants of health.          Final Clinical Impression(s) / ED Diagnoses Final diagnoses:  Elevated LFTs  Alcohol withdrawal syndrome without complication (HCC)  Liver mass    Rx / DC Orders ED Discharge Orders          Ordered    Ambulatory referral to Hematology / Oncology       Comments: Your emergency department provider has referred you to see a hematology/oncology specialist. These are physicians who specialize in blood disorders and cancers, or findings concerning for cancer. You will receive a phone call from the [facility] Office to set up your appointment within 2 business days: [employer] operate Mon - Fri, 8:00 a.m. to 5:00 p.m.; closed for federally recognized holidays. Please be sure your phone is not set to block numbers during this time.   01/18/23 1240    chlordiazePOXIDE (LIBRIUM) 25 MG capsule        01/18/23 1241    ondansetron (ZOFRAN-ODT) 4 MG disintegrating tablet  Every 8 hours PRN        01/18/23 1241              Karita Dralle A, PA-C 01/18/23 1252    Pricilla Loveless, MD 01/19/23 0725

## 2023-01-18 NOTE — Discharge Instructions (Signed)
It was a pleasure taking care of you here in the emergency department.  As discussed in the room we have started you on a medication called Librium that help with your alcohol withdrawals.  Is important you do not drink alcohol while taking this medication.  As discussed in the room the mass on your liver is suspicious for cancer.  You have discussed with your gastroenterologist.  They will be in contact with you.  I have also placed a referral to oncology.  They will need to see you to obtain a biopsy  Return for new or worsening symptoms.

## 2023-01-22 ENCOUNTER — Encounter: Payer: Self-pay | Admitting: Physician Assistant

## 2023-01-22 ENCOUNTER — Inpatient Hospital Stay: Payer: 59

## 2023-01-22 ENCOUNTER — Other Ambulatory Visit: Payer: Self-pay | Admitting: Gastroenterology

## 2023-01-22 ENCOUNTER — Other Ambulatory Visit: Payer: Self-pay

## 2023-01-22 ENCOUNTER — Inpatient Hospital Stay: Payer: 59 | Attending: Physician Assistant | Admitting: Physician Assistant

## 2023-01-22 VITALS — BP 146/83 | HR 90 | Temp 98.4°F | Resp 20 | Wt 141.6 lb

## 2023-01-22 DIAGNOSIS — R930 Abnormal findings on diagnostic imaging of skull and head, not elsewhere classified: Secondary | ICD-10-CM | POA: Diagnosis not present

## 2023-01-22 DIAGNOSIS — Z79899 Other long term (current) drug therapy: Secondary | ICD-10-CM | POA: Insufficient documentation

## 2023-01-22 DIAGNOSIS — D696 Thrombocytopenia, unspecified: Secondary | ICD-10-CM | POA: Diagnosis not present

## 2023-01-22 DIAGNOSIS — R160 Hepatomegaly, not elsewhere classified: Secondary | ICD-10-CM | POA: Diagnosis present

## 2023-01-22 DIAGNOSIS — K7030 Alcoholic cirrhosis of liver without ascites: Secondary | ICD-10-CM | POA: Diagnosis present

## 2023-01-22 DIAGNOSIS — F17210 Nicotine dependence, cigarettes, uncomplicated: Secondary | ICD-10-CM | POA: Diagnosis not present

## 2023-01-22 DIAGNOSIS — Z8249 Family history of ischemic heart disease and other diseases of the circulatory system: Secondary | ICD-10-CM | POA: Diagnosis not present

## 2023-01-22 DIAGNOSIS — Z5986 Financial insecurity: Secondary | ICD-10-CM | POA: Insufficient documentation

## 2023-01-22 DIAGNOSIS — R0602 Shortness of breath: Secondary | ICD-10-CM | POA: Insufficient documentation

## 2023-01-22 DIAGNOSIS — R932 Abnormal findings on diagnostic imaging of liver and biliary tract: Secondary | ICD-10-CM | POA: Insufficient documentation

## 2023-01-22 DIAGNOSIS — F32A Depression, unspecified: Secondary | ICD-10-CM | POA: Diagnosis not present

## 2023-01-22 DIAGNOSIS — D649 Anemia, unspecified: Secondary | ICD-10-CM | POA: Diagnosis not present

## 2023-01-22 DIAGNOSIS — I700 Atherosclerosis of aorta: Secondary | ICD-10-CM | POA: Insufficient documentation

## 2023-01-22 DIAGNOSIS — I10 Essential (primary) hypertension: Secondary | ICD-10-CM | POA: Insufficient documentation

## 2023-01-22 LAB — VITAMIN B12: Vitamin B-12: 1007 pg/mL — ABNORMAL HIGH (ref 180–914)

## 2023-01-22 LAB — CEA (ACCESS): CEA (CHCC): 5.5 ng/mL — ABNORMAL HIGH (ref 0.00–5.00)

## 2023-01-22 LAB — CMP (CANCER CENTER ONLY)
ALT: 51 U/L — ABNORMAL HIGH (ref 0–44)
AST: 124 U/L — ABNORMAL HIGH (ref 15–41)
Albumin: 3.6 g/dL (ref 3.5–5.0)
Alkaline Phosphatase: 140 U/L — ABNORMAL HIGH (ref 38–126)
Anion gap: 8 (ref 5–15)
BUN: <5 mg/dL — ABNORMAL LOW (ref 6–20)
CO2: 28 mmol/L (ref 22–32)
Calcium: 8.7 mg/dL — ABNORMAL LOW (ref 8.9–10.3)
Chloride: 104 mmol/L (ref 98–111)
Creatinine: 0.63 mg/dL (ref 0.61–1.24)
GFR, Estimated: >60 mL/min (ref >60)
Glucose, Bld: 84 mg/dL (ref 70–99)
Potassium: 3.7 mmol/L (ref 3.5–5.1)
Sodium: 140 mmol/L (ref 135–145)
Total Bilirubin: 1.2 mg/dL (ref 0.3–1.2)
Total Protein: 7.3 g/dL (ref 6.5–8.1)

## 2023-01-22 LAB — CBC WITH DIFFERENTIAL (CANCER CENTER ONLY)
Abs Immature Granulocytes: 0.01 10*3/uL (ref 0.00–0.07)
Basophils Absolute: 0 10*3/uL (ref 0.0–0.1)
Basophils Relative: 1 %
Eosinophils Absolute: 0 10*3/uL (ref 0.0–0.5)
Eosinophils Relative: 1 %
HCT: 36.5 % — ABNORMAL LOW (ref 39.0–52.0)
Hemoglobin: 12.4 g/dL — ABNORMAL LOW (ref 13.0–17.0)
Immature Granulocytes: 0 %
Lymphocytes Relative: 26 %
Lymphs Abs: 0.9 10*3/uL (ref 0.7–4.0)
MCH: 34.6 pg — ABNORMAL HIGH (ref 26.0–34.0)
MCHC: 34 g/dL (ref 30.0–36.0)
MCV: 102 fL — ABNORMAL HIGH (ref 80.0–100.0)
Monocytes Absolute: 0.6 10*3/uL (ref 0.1–1.0)
Monocytes Relative: 17 %
Neutro Abs: 1.8 10*3/uL (ref 1.7–7.7)
Neutrophils Relative %: 55 %
Platelet Count: 52 10*3/uL — ABNORMAL LOW (ref 150–400)
RBC: 3.58 MIL/uL — ABNORMAL LOW (ref 4.22–5.81)
RDW: 15.6 % — ABNORMAL HIGH (ref 11.5–15.5)
WBC Count: 3.3 10*3/uL — ABNORMAL LOW (ref 4.0–10.5)
nRBC: 0 % (ref 0.0–0.2)

## 2023-01-22 LAB — FOLATE: Folate: 5.2 ng/mL — ABNORMAL LOW (ref >5.9)

## 2023-01-22 NOTE — Progress Notes (Signed)
I met with [name] after his consultation with Georga Kaufmann, PA-C and Dr Leonides Schanz.  I explained my role as a nurse navigator and provided my contact information.
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
## 2023-01-23 ENCOUNTER — Encounter: Payer: Self-pay | Admitting: General Practice

## 2023-01-23 LAB — AFP TUMOR MARKER: AFP, Serum, Tumor Marker: 2.5 ng/mL (ref 0.0–8.4)

## 2023-01-23 NOTE — Progress Notes (Signed)
Dylan Balm, MD  Caroleen Hamman, NT PROCEDURE / BIOPSY REVIEW Date: 01/22/23  Requested Biopsy site: R liver lesion subcapsular seg 8 Reason for request: abnl MRI Imaging review: Best seen on MR 01/17/23 Im 16 Se 13 (not correlating with prior US findings)  Decision: Approved if we can see it with US Imaging modality to perform: Ultrasound Schedule with: Moderate Sedation Schedule for: Any VIR  Additional comments: @Schedulers . Have pt come to [place] before short stay to see if we can even see it for biopsy, difficult approach  Please contact me with questions, concerns, or if issue pertaining to this request arise.  Dayne Dylan Balm, MD [facility]       Previous Messages    ----- Message ----- From: Caroleen Hamman, NT Sent: 01/22/2023  11:13 AM EDT To: Ir Procedure Requests Subject: US BIOPSY (LIVER)                              Procedure: US Biopsy Liver  Reason: Liver Mass  History: MR and [place] in chart  Provider: Briant Cedar, PA-C  Contact: [phone]

## 2023-01-24 ENCOUNTER — Other Ambulatory Visit: Payer: Self-pay

## 2023-01-24 NOTE — Progress Notes (Signed)
The proposed treatment discussed in conference is for discussion purpose only and is not a binding recommendation.  The patients have not been physically examined, or presented with their treatment options.  Therefore, final treatment plans cannot be decided.  

## 2023-01-25 ENCOUNTER — Other Ambulatory Visit: Payer: Self-pay

## 2023-01-25 DIAGNOSIS — R160 Hepatomegaly, not elsewhere classified: Secondary | ICD-10-CM

## 2023-01-25 NOTE — Progress Notes (Signed)
I spoke with [name].  I have scheduled him for his follow up appt with Dr Leonides Schanz to review PET scan results and biopsy results.  He is aware of the date and time.  All questions were answered.  He verbalized understanding.

## 2023-01-29 ENCOUNTER — Other Ambulatory Visit: Payer: Self-pay | Admitting: Physician Assistant

## 2023-01-29 ENCOUNTER — Telehealth: Payer: Self-pay

## 2023-01-29 MED ORDER — FOLIC ACID 1 MG PO TABS: 1.0000 mg | ORAL_TABLET | Freq: Every day | ORAL | 3 refills | Status: DC

## 2023-01-29 NOTE — Telephone Encounter (Signed)
Pt advised of lab results and that a new prescription was sent to his pharmacy for Folic acid. Pt said he would call his pharmacy to make arrangements for delivery

## 2023-01-29 NOTE — Telephone Encounter (Signed)
-----   Message from Briant Cedar sent at 01/29/2023  1:47 PM EDT ----- Please notify patient that there is evidence of folate deficiency. I sent prescription for folic acid to his pharmacy. ----- Message ----- From: Interface, Lab In [place] Sent: 01/22/2023  10:48 AM EDT To: Briant Cedar, PA-C

## 2023-02-01 ENCOUNTER — Ambulatory Visit (HOSPITAL_COMMUNITY): Payer: 59

## 2023-02-01 ENCOUNTER — Ambulatory Visit (HOSPITAL_COMMUNITY)
Admission: RE | Admit: 2023-02-01 | Discharge: 2023-02-01 | Disposition: A | Discharge status: Home / Self Care | Payer: 59 | Source: Ambulatory Visit | Attending: Physician Assistant | Admitting: Physician Assistant

## 2023-02-01 DIAGNOSIS — I700 Atherosclerosis of aorta: Secondary | ICD-10-CM | POA: Insufficient documentation

## 2023-02-01 DIAGNOSIS — R160 Hepatomegaly, not elsewhere classified: Secondary | ICD-10-CM

## 2023-02-01 DIAGNOSIS — K7460 Unspecified cirrhosis of liver: Secondary | ICD-10-CM | POA: Diagnosis not present

## 2023-02-01 DIAGNOSIS — R918 Other nonspecific abnormal finding of lung field: Secondary | ICD-10-CM | POA: Insufficient documentation

## 2023-02-01 DIAGNOSIS — J439 Emphysema, unspecified: Secondary | ICD-10-CM | POA: Insufficient documentation

## 2023-02-01 LAB — GLUCOSE, CAPILLARY: Glucose-Capillary: 106 mg/dL — ABNORMAL HIGH (ref 70–99)

## 2023-02-01 MED ORDER — FLUDEOXYGLUCOSE F - 18 (FDG) INJECTION
7.0000 | Freq: Once | INTRAVENOUS | Status: AC
  Administered 2023-02-01: 6.96 via INTRAVENOUS

## 2023-02-02 ENCOUNTER — Other Ambulatory Visit (HOSPITAL_COMMUNITY): Payer: Self-pay | Admitting: Student

## 2023-02-02 DIAGNOSIS — R160 Hepatomegaly, not elsewhere classified: Secondary | ICD-10-CM

## 2023-02-04 ENCOUNTER — Other Ambulatory Visit: Payer: Self-pay | Admitting: Student

## 2023-02-04 ENCOUNTER — Encounter (HOSPITAL_COMMUNITY): Payer: Self-pay
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
## 2023-02-05 ENCOUNTER — Telehealth: Payer: Self-pay | Admitting: Radiology

## 2023-02-05 ENCOUNTER — Ambulatory Visit (HOSPITAL_COMMUNITY): Payer: 59

## 2023-02-05 ENCOUNTER — Ambulatory Visit (HOSPITAL_COMMUNITY)
Admission: RE | Admit: 2023-02-05 | Discharge: 2023-02-05 | Disposition: A | Discharge status: Home / Self Care | Payer: 59 | Source: Ambulatory Visit | Attending: Physician Assistant | Admitting: Physician Assistant

## 2023-02-05 NOTE — Telephone Encounter (Signed)
Patient called to let him know that procedure scheduled for today in IR is cancelled.  Case reviewed with IR Attending Dr. Jacqualine Code. PET scan performed on 8.1.24 reads No hypermetabolism to correspond to the hepatic dome abnormality on prior MRI. This is favored to represent an area of volume averaging with the adjacent hemidiaphragm, especially when correlated with coronal images. That coupled with the difficult approach to obtain biopsy will be cancelled at this time. Team made aware via EPIC chat

## 2023-02-06 ENCOUNTER — Telehealth: Payer: Self-pay | Admitting: Physician Assistant

## 2023-02-06 DIAGNOSIS — R942 Abnormal results of pulmonary function studies: Secondary | ICD-10-CM

## 2023-02-06 NOTE — Telephone Encounter (Signed)
I called [name] to review the plan since PET scan showed no hypermetabolism to correspond to the hepatic dome abnormality on prior MRI. IR cancelled his liver biopsy as a result.   The PET scan did show moderate hypermetabolism within the right suprahilar region,without correlate adenopathy. We recommend CT chest to surveillance the area in 3 months.   Patient expressed understanding of the plan provided.

## 2023-02-08 ENCOUNTER — Encounter: Payer: 59 | Admitting: Student

## 2023-02-08 ENCOUNTER — Inpatient Hospital Stay: Payer: 59

## 2023-02-08 ENCOUNTER — Inpatient Hospital Stay: Payer: 59 | Admitting: Hematology and Oncology

## 2023-02-15 ENCOUNTER — Ambulatory Visit: Payer: 59 | Admitting: Student

## 2023-02-15 ENCOUNTER — Encounter: Payer: Self-pay | Admitting: Student

## 2023-02-15 ENCOUNTER — Other Ambulatory Visit (HOSPITAL_COMMUNITY): Payer: Self-pay

## 2023-02-15 VITALS — BP 113/76 | HR 100 | Temp 98.2°F | Ht 67.0 in | Wt 135.3 lb

## 2023-02-15 DIAGNOSIS — F1020 Alcohol dependence, uncomplicated: Secondary | ICD-10-CM

## 2023-02-15 DIAGNOSIS — F17210 Nicotine dependence, cigarettes, uncomplicated: Secondary | ICD-10-CM

## 2023-02-15 DIAGNOSIS — F1090 Alcohol use, unspecified, uncomplicated: Secondary | ICD-10-CM

## 2023-02-15 DIAGNOSIS — K7030 Alcoholic cirrhosis of liver without ascites: Secondary | ICD-10-CM | POA: Diagnosis not present

## 2023-02-15 DIAGNOSIS — F17200 Nicotine dependence, unspecified, uncomplicated: Secondary | ICD-10-CM

## 2023-02-15 MED ORDER — VARENICLINE TARTRATE 1 MG PO TABS
ORAL_TABLET | ORAL | 0 refills | Status: AC
  Filled 2023-02-15: qty 56, 30d supply, fill #0
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
## 2023-02-15 NOTE — Patient Instructions (Signed)
Thank you, [name]. for allowing [place] to provide your care today. Today we discussed your alcohol use disorder, your smoking cessation, and your recent visit with oncology.    I have ordered the following labs for you:  Lab Orders  No laboratory test(s) ordered today     Tests ordered today:  None  Referrals ordered today:   Referral Orders  No referral(s) requested today     I have ordered the following medication/changed the following medications:   Stop the following medications: There are no discontinued medications.   Start the following medications: No orders of the defined types were placed in this encounter.    Follow up:   Already scheduled to come back 8/29 at 3:15 PM with Dr. Welton Flakes.    Remember:   - Varenicline is also called Chantix and this medication is for your smoking cessation. Your instructions for how to take it are included but remember to start out with 0.5 mg for a few days, then take 1 mg for a few more days, then take 1 mg twice a day for the remaining pills.   - Naltrexone (Depade) is a medication used to help you to quit drinking. Check your medications to see if you already have it, it should be Naltrexone 50 mg taken daily.   - Please remember to bring all of your medications at your next visit at the end of August. And be on the lookout for a phone call to schedule the CT scan around October/November.   Should you have any questions or concerns please call the internal medicine clinic at [phone].     Jatin Naumann Colbert Coyer, MD PGY-1 Internal Medicine Teaching Progam [facility]

## 2023-02-17 NOTE — Assessment & Plan Note (Addendum)
Patient seen by oncology 7/22 for potential liver mass. No liver biopsy performed as PET scan findings less concerning for hepatocellular carcinoma or intrahepatic cholangiocarcinoma. Will continue to monitor.

## 2023-02-17 NOTE — Assessment & Plan Note (Signed)
Patient is still drinking beers occasionally, last drink 2 beers on 8/15 with friend who came to visit patient at his home. Patient acknowledges importance of being sober given his alcoholic cirrhosis. Expressed interest in in-person AA and other community resources. Was prescribed Naltrexone 50 mg daily to help with alcohol cessation by Dr. Geraldo Pitter June 2024 but patient does not recall taking it. Did not bring medications with him to the appointment, unable to determine whether it is one of the pills he takes daily. Reviewed use of Naltrexone with patient, patient will look at home to see if he has it available.  Plan:  - Follow up on Naltrexone use at 8/29 visit  - Ambulatory referral to integrated behavioral health for assistance with in-person community resources

## 2023-02-17 NOTE — Assessment & Plan Note (Signed)
Discussed current cigarette use, about a pack a day, and its association with drinking alcohol. Patient is motivated to stop smoking, especially with granddaughter around and experience with hematology and oncology. Lung exam normal today with the exception of some wheezing on b/l upper posterior lobes. Patient has CT chest pending around Oct/Nov for surveillance of hypermetabolism within the right suprahilar region on PET scan.  Plan:  - START Chantix starter pack, directions explained and written down for patient  - Will follow up on smoking cessation at 8/29 clinic visit

## 2023-02-19 NOTE — Addendum Note (Signed)
Addended by: Erlinda Hong T on: 02/19/2023 04:25 PM   Modules accepted: Level of Service

## 2023-02-19 NOTE — Progress Notes (Signed)
Internal Medicine Attending:  I was physically present during the critical or key portions of the resident provided service and participated in formulating the plan of care and medical decision making for the patient as documented in the resident's note.  Tyson Alias, MD

## 2023-02-22 ENCOUNTER — Other Ambulatory Visit: Payer: Self-pay | Admitting: Student

## 2023-02-27 ENCOUNTER — Other Ambulatory Visit: Payer: Self-pay | Admitting: Gastroenterology

## 2023-03-01 ENCOUNTER — Ambulatory Visit: Payer: 59

## 2023-03-01 ENCOUNTER — Ambulatory Visit (INDEPENDENT_AMBULATORY_CARE_PROVIDER_SITE_OTHER): Payer: 59 | Admitting: Internal Medicine

## 2023-03-01 ENCOUNTER — Encounter: Payer: Self-pay | Admitting: Internal Medicine

## 2023-03-01 VITALS — BP 154/86 | HR 81 | Temp 98.6°F | Ht 67.0 in | Wt 137.8 lb

## 2023-03-01 DIAGNOSIS — I10 Essential (primary) hypertension: Secondary | ICD-10-CM

## 2023-03-01 DIAGNOSIS — F17210 Nicotine dependence, cigarettes, uncomplicated: Secondary | ICD-10-CM | POA: Diagnosis not present

## 2023-03-01 DIAGNOSIS — K7030 Alcoholic cirrhosis of liver without ascites: Secondary | ICD-10-CM

## 2023-03-01 DIAGNOSIS — F17200 Nicotine dependence, unspecified, uncomplicated: Secondary | ICD-10-CM

## 2023-03-01 DIAGNOSIS — Z23 Encounter for immunization: Secondary | ICD-10-CM | POA: Diagnosis not present

## 2023-03-01 DIAGNOSIS — Z0000 Encounter for general adult medical examination without abnormal findings: Secondary | ICD-10-CM

## 2023-03-01 DIAGNOSIS — R911 Solitary pulmonary nodule: Secondary | ICD-10-CM

## 2023-03-01 DIAGNOSIS — F1020 Alcohol dependence, uncomplicated: Secondary | ICD-10-CM | POA: Diagnosis not present

## 2023-03-01 DIAGNOSIS — E785 Hyperlipidemia, unspecified: Secondary | ICD-10-CM

## 2023-03-01 MED ORDER — SPIRONOLACTONE 50 MG PO TABS
50.0000 mg | ORAL_TABLET | Freq: Every day | ORAL | 0 refills | Status: DC
Start: 2023-03-01 — End: 2023-12-05

## 2023-03-01 MED ORDER — ATORVASTATIN CALCIUM 20 MG PO TABS
20.0000 mg | ORAL_TABLET | Freq: Every day | ORAL | 11 refills | Status: DC
Start: 2023-03-01 — End: 2024-02-02

## 2023-03-01 MED ORDER — FUROSEMIDE 40 MG PO TABS
20.0000 mg | ORAL_TABLET | Freq: Every day | ORAL | 3 refills | Status: DC
Start: 2023-03-01 — End: 2023-05-16
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
## 2023-03-01 NOTE — Patient Instructions (Addendum)
[name], it was a pleasure seeing you today! You endorsed feeling well today. Below are some of the things we talked about this visit. We look forward to seeing you in the follow up appointment!  Today we discussed: Please start taking your medicines. I want you so start taking the naltrexone and chantix starting today. I would like you to start taking the lasix and spirinolactone at half the doses.   You will get a flu shot today.   I have ordered the following labs today:  Lab Orders  No laboratory test(s) ordered today      Referrals ordered today:   Referral Orders  No referral(s) requested today     I have ordered the following medication/changed the following medications:   Stop the following medications: Medications Discontinued During This Encounter  Medication Reason   spironolactone (ALDACTONE) 100 MG tablet    ondansetron (ZOFRAN-ODT) 4 MG disintegrating tablet    furosemide (LASIX) 40 MG tablet    folic acid (FOLVITE) 1 MG tablet    cyclobenzaprine (FLEXERIL) 10 MG tablet    chlordiazePOXIDE (LIBRIUM) 25 MG capsule    atorvastatin (LIPITOR) 40 MG tablet      Start the following medications: Meds ordered this encounter  Medications   atorvastatin (LIPITOR) 20 MG tablet    Sig: Take 1 tablet (20 mg total) by mouth daily.    Dispense:  30 tablet    Refill:  11   furosemide (LASIX) 40 MG tablet    Sig: Take 0.5 tablets (20 mg total) by mouth daily.    Dispense:  30 tablet    Refill:  3   spironolactone (ALDACTONE) 50 MG tablet    Sig: Take 1 tablet (50 mg total) by mouth daily.    Dispense:  30 tablet    Refill:  0    Please send a replace/new response with 100-Day Supply if appropriate to maximize member benefit. Requesting 1 year supply.     Follow-up: 2 week follow up  Please make sure to arrive 15 minutes prior to your next appointment. If you arrive late, you may be asked to reschedule.   We look forward to seeing you next time.  Please call our clinic at [phone] if you have any questions or concerns. The best time to call is Monday-Friday from 9am-4pm, but there is someone available 24/7. If after hours or the weekend, call the main hospital number and ask for the Internal Medicine Resident On-Call. If you need medication refills, please notify your pharmacy one week in advance and they will send [place] a request.  Thank you for letting us take part in your care. Wishing you the best!  Thank you, Gwenevere Abbot, MD

## 2023-03-04 ENCOUNTER — Encounter: Payer: Self-pay | Admitting: Internal Medicine

## 2023-03-04 NOTE — Assessment & Plan Note (Signed)
When asked patient regarding his compliance with Lasix and spironolactone. He stated he is not taking any of his medications.  When inquired about the reason for his nonadherence, he stated it is because he feels great.  Counseled on pathophysiology of cirrhosis and drinking along with recent imaging suggestive of a liver mass.  Advised patient that his cirrhosis is not affecting his life but his drinking and nonadherence to medications can lead to decompensated cirrhosis.  Advised him to restart his Lasix and spironolactone at lower doses and follow-up in 2 weeks for repeat blood work.

## 2023-03-04 NOTE — Assessment & Plan Note (Signed)
Patient was started on Chantix to help with tobacco cessation.  He states he has not started this medication and is currently smoking half a pack a day.  It appears this medication was dispensed on 2 separate occasions in August  so I counseled the patient on the benefit of the medication and the role it is play in helping him quit smoking.  I advised him to set a quit date as it would allow for greater success in smoking cessation.

## 2023-03-04 NOTE — Assessment & Plan Note (Signed)
Needs repeat low-dose CT on 04/25.  Will follow-up on smoking cessation efforts at next visit.

## 2023-03-04 NOTE — Assessment & Plan Note (Signed)
Flu shot given

## 2023-03-04 NOTE — Assessment & Plan Note (Signed)
Uncontrolled today but patient is nonadherent to his diuretic regimen.  We are restarting this at lower doses and will follow-up in 2 weeks.

## 2023-03-04 NOTE — Assessment & Plan Note (Signed)
Patient was previously drinking 9 beers a day and fifth of brandy in 2 days.  Currently he is drinking 2 beers a day and has avoided brandy except for during very rare occasion especially if he has company over.  Given his history of cirrhosis he was started on naltrexone and this was close be a follow-up visit.  Patient reports he has not started taking this medication.  Has naltrexone at home but not started it yet.  On chart review, it appears patient stopped drinking alcohol after he was admitted and consulted for rehab but then restarted few months later.  Discussed the importance of alcohol abstinence given his cirrhosis. Pt understanding. Will have him come back in 2 weeks with all home medications.

## 2023-03-07 NOTE — Progress Notes (Signed)
Internal Medicine Clinic Attending  Case discussed with the resident at the time of the visit.  We reviewed the resident's history and exam and pertinent patient test results.  I agree with the assessment, diagnosis, and plan of care documented in the resident's note.  

## 2023-03-08 ENCOUNTER — Other Ambulatory Visit: Payer: Self-pay | Admitting: Internal Medicine

## 2023-03-08 DIAGNOSIS — F1020 Alcohol dependence, uncomplicated: Secondary | ICD-10-CM

## 2023-03-09 ENCOUNTER — Other Ambulatory Visit: Payer: Self-pay | Admitting: Internal Medicine

## 2023-03-09 DIAGNOSIS — F1020 Alcohol dependence, uncomplicated: Secondary | ICD-10-CM

## 2023-03-19 ENCOUNTER — Encounter: Payer: 59 | Admitting: Internal Medicine

## 2023-03-21 ENCOUNTER — Encounter: Payer: 59 | Admitting: Student

## 2023-03-21 ENCOUNTER — Institutional Professional Consult (permissible substitution): Payer: 59 | Admitting: Licensed Clinical Social Worker

## 2023-05-07 ENCOUNTER — Encounter (HOSPITAL_COMMUNITY): Payer: Self-pay

## 2023-05-07 ENCOUNTER — Ambulatory Visit (HOSPITAL_COMMUNITY)
Admission: RE | Admit: 2023-05-07 | Discharge: 2023-05-07 | Disposition: A | Discharge status: Home / Self Care | Payer: 59 | Source: Ambulatory Visit | Attending: Physician Assistant | Admitting: Physician Assistant

## 2023-05-07 DIAGNOSIS — R942 Abnormal results of pulmonary function studies: Secondary | ICD-10-CM | POA: Insufficient documentation

## 2023-05-07 MED ORDER — IOHEXOL 300 MG/ML  SOLN
75.0000 mL | Freq: Once | INTRAMUSCULAR | Status: AC | PRN
  Administered 2023-05-07: 75 mL via INTRAVENOUS

## 2023-05-13 ENCOUNTER — Other Ambulatory Visit: Payer: Self-pay | Admitting: Student

## 2023-05-13 DIAGNOSIS — F1020 Alcohol dependence, uncomplicated: Secondary | ICD-10-CM

## 2023-05-16 ENCOUNTER — Telehealth: Payer: Self-pay | Admitting: Hematology and Oncology

## 2023-05-17 ENCOUNTER — Other Ambulatory Visit: Payer: 59

## 2023-05-17 ENCOUNTER — Inpatient Hospital Stay: Payer: 59 | Attending: Hematology and Oncology | Admitting: Hematology and Oncology

## 2023-05-17 ENCOUNTER — Other Ambulatory Visit: Payer: Self-pay | Admitting: Hematology and Oncology

## 2023-05-17 VITALS — BP 149/92 | HR 81 | Temp 97.7°F | Resp 15 | Wt 142.6 lb

## 2023-05-17 DIAGNOSIS — F32A Depression, unspecified: Secondary | ICD-10-CM | POA: Diagnosis not present

## 2023-05-17 DIAGNOSIS — C220 Liver cell carcinoma: Secondary | ICD-10-CM | POA: Diagnosis present

## 2023-05-17 DIAGNOSIS — R160 Hepatomegaly, not elsewhere classified: Secondary | ICD-10-CM | POA: Diagnosis not present

## 2023-05-17 DIAGNOSIS — D539 Nutritional anemia, unspecified: Secondary | ICD-10-CM | POA: Diagnosis not present

## 2023-05-17 DIAGNOSIS — R162 Hepatomegaly with splenomegaly, not elsewhere classified: Secondary | ICD-10-CM | POA: Diagnosis not present

## 2023-05-17 DIAGNOSIS — R930 Abnormal findings on diagnostic imaging of skull and head, not elsewhere classified: Secondary | ICD-10-CM | POA: Insufficient documentation

## 2023-05-17 DIAGNOSIS — K760 Fatty (change of) liver, not elsewhere classified: Secondary | ICD-10-CM | POA: Insufficient documentation

## 2023-05-17 DIAGNOSIS — R942 Abnormal results of pulmonary function studies: Secondary | ICD-10-CM

## 2023-05-17 DIAGNOSIS — I700 Atherosclerosis of aorta: Secondary | ICD-10-CM | POA: Insufficient documentation

## 2023-05-17 DIAGNOSIS — I10 Essential (primary) hypertension: Secondary | ICD-10-CM | POA: Diagnosis not present

## 2023-05-17 DIAGNOSIS — Z79899 Other long term (current) drug therapy: Secondary | ICD-10-CM | POA: Diagnosis not present

## 2023-05-17 DIAGNOSIS — D696 Thrombocytopenia, unspecified: Secondary | ICD-10-CM | POA: Insufficient documentation

## 2023-05-17 DIAGNOSIS — Z5986 Financial insecurity: Secondary | ICD-10-CM | POA: Diagnosis not present

## 2023-05-17 DIAGNOSIS — F17210 Nicotine dependence, cigarettes, uncomplicated: Secondary | ICD-10-CM | POA: Insufficient documentation

## 2023-05-17 DIAGNOSIS — Z8249 Family history of ischemic heart disease and other diseases of the circulatory system: Secondary | ICD-10-CM | POA: Insufficient documentation

## 2023-05-17 DIAGNOSIS — K7030 Alcoholic cirrhosis of liver without ascites: Secondary | ICD-10-CM | POA: Diagnosis not present

## 2023-05-17 DIAGNOSIS — J439 Emphysema, unspecified: Secondary | ICD-10-CM | POA: Insufficient documentation

## 2023-05-17 LAB — CBC WITH DIFFERENTIAL (CANCER CENTER ONLY)
Abs Immature Granulocytes: 0.01 10*3/uL (ref 0.00–0.07)
Basophils Absolute: 0 10*3/uL (ref 0.0–0.1)
Basophils Relative: 1 %
Eosinophils Absolute: 0 10*3/uL (ref 0.0–0.5)
Eosinophils Relative: 0 %
HCT: 42.4 % (ref 39.0–52.0)
Hemoglobin: 14.4 g/dL (ref 13.0–17.0)
Immature Granulocytes: 0 %
Lymphocytes Relative: 32 %
Lymphs Abs: 1.6 10*3/uL (ref 0.7–4.0)
MCH: 33.9 pg (ref 26.0–34.0)
MCHC: 34 g/dL (ref 30.0–36.0)
MCV: 99.8 fL (ref 80.0–100.0)
Monocytes Absolute: 0.5 10*3/uL (ref 0.1–1.0)
Monocytes Relative: 10 %
Neutro Abs: 2.9 10*3/uL (ref 1.7–7.7)
Neutrophils Relative %: 57 %
Platelet Count: 59 10*3/uL — ABNORMAL LOW (ref 150–400)
RBC: 4.25 MIL/uL (ref 4.22–5.81)
RDW: 14.5 % (ref 11.5–15.5)
WBC Count: 5.1 10*3/uL (ref 4.0–10.5)
nRBC: 0 % (ref 0.0–0.2)

## 2023-05-17 LAB — CMP (CANCER CENTER ONLY)
ALT: 62 U/L — ABNORMAL HIGH (ref 0–44)
AST: 208 U/L (ref 15–41)
Albumin: 4 g/dL (ref 3.5–5.0)
Alkaline Phosphatase: 179 U/L — ABNORMAL HIGH (ref 38–126)
Anion gap: 9 (ref 5–15)
BUN: 7 mg/dL (ref 6–20)
CO2: 29 mmol/L (ref 22–32)
Calcium: 9.2 mg/dL (ref 8.9–10.3)
Chloride: 102 mmol/L (ref 98–111)
Creatinine: 0.68 mg/dL (ref 0.61–1.24)
GFR, Estimated: >60 mL/min (ref >60)
Glucose, Bld: 90 mg/dL (ref 70–99)
Potassium: 4.1 mmol/L (ref 3.5–5.1)
Sodium: 140 mmol/L (ref 135–145)
Total Bilirubin: 0.9 mg/dL (ref <1.2)
Total Protein: 8.2 g/dL — ABNORMAL HIGH (ref 6.5–8.1)

## 2023-05-17 LAB — LACTATE DEHYDROGENASE: LDH: 201 U/L — ABNORMAL HIGH (ref 98–192)
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
## 2023-05-18 LAB — CANCER ANTIGEN 19-9: CA 19-9: 129 U/mL — ABNORMAL HIGH (ref 0–35)

## 2023-06-04 ENCOUNTER — Other Ambulatory Visit: Payer: Self-pay | Admitting: Physician Assistant

## 2023-08-13 ENCOUNTER — Other Ambulatory Visit (HOSPITAL_COMMUNITY): Payer: Self-pay

## 2023-08-13 ENCOUNTER — Ambulatory Visit (INDEPENDENT_AMBULATORY_CARE_PROVIDER_SITE_OTHER): Payer: 59 | Admitting: Student

## 2023-08-13 VITALS — BP 133/89 | HR 101 | Temp 98.8°F | Ht 67.0 in | Wt 138.7 lb

## 2023-08-13 DIAGNOSIS — F1020 Alcohol dependence, uncomplicated: Secondary | ICD-10-CM

## 2023-08-13 DIAGNOSIS — R0609 Other forms of dyspnea: Secondary | ICD-10-CM | POA: Diagnosis not present

## 2023-08-13 DIAGNOSIS — F10930 Alcohol use, unspecified with withdrawal, uncomplicated: Secondary | ICD-10-CM

## 2023-08-13 DIAGNOSIS — M25562 Pain in left knee: Secondary | ICD-10-CM

## 2023-08-13 DIAGNOSIS — K7030 Alcoholic cirrhosis of liver without ascites: Secondary | ICD-10-CM | POA: Diagnosis not present

## 2023-08-13 DIAGNOSIS — D696 Thrombocytopenia, unspecified: Secondary | ICD-10-CM

## 2023-08-13 DIAGNOSIS — L709 Acne, unspecified: Secondary | ICD-10-CM

## 2023-08-13 DIAGNOSIS — G8929 Other chronic pain: Secondary | ICD-10-CM

## 2023-08-13 DIAGNOSIS — R911 Solitary pulmonary nodule: Secondary | ICD-10-CM

## 2023-08-13 MED ORDER — CELECOXIB 200 MG PO CAPS
200.0000 mg | ORAL_CAPSULE | Freq: Two times a day (BID) | ORAL | 0 refills | Status: DC
Start: 2023-08-13 — End: 2023-10-30
  Filled 2023-08-13: qty 28, 14d supply, fill #0

## 2023-08-13 MED ORDER — ALBUTEROL SULFATE HFA 108 (90 BASE) MCG/ACT IN AERS
2.0000 | INHALATION_SPRAY | Freq: Four times a day (QID) | RESPIRATORY_TRACT | 2 refills | Status: DC | PRN
Start: 2023-08-13 — End: 2023-10-29
  Filled 2023-08-13: qty 6.7, 25d supply, fill #0

## 2023-08-13 MED ORDER — CHLORDIAZEPOXIDE HCL 25 MG PO CAPS
ORAL_CAPSULE | ORAL | 0 refills | Status: AC
Start: 2023-08-13 — End: 2023-08-18
  Filled 2023-08-13: qty 9, 5d supply, fill #0

## 2023-08-13 MED ORDER — STIOLTO RESPIMAT 2.5-2.5 MCG/ACT IN AERS
2.0000 | INHALATION_SPRAY | Freq: Every day | RESPIRATORY_TRACT | 3 refills | Status: DC
Start: 2023-08-13 — End: 2023-10-29
  Filled 2023-08-13: qty 4, 30d supply, fill #0
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
## 2023-08-13 NOTE — Patient Instructions (Signed)
  Thank you, [name], for allowing us  to provide your care today. Today we discussed . . .  > Alcohol detox       -I have prescribed Librium  (chlordiazepoxide ) with instructions for taper.  Please start taking this today and it will take care of your alcohol withdrawal symptoms.  I have also sent in a refill of your naltrexone  which you should start taking daily whenever you receive it.  If you have any worsening withdrawal despite taking the Librium  at home please call our clinic or if it is after hours go to the hospital.  Symptoms such as severe anxiety, severe tremors, confusion, and seizures are things to look out for. > Trouble breathing       -I think due to your long smoking history you have COPD but I would like to get pulmonary function testing to confirm this.  In the meantime I will start you on 2 inhalers.  One is a daily inhaler called Stiolto and the other is a rescue inhaler of albuterol  which you can take every 4-6 hours for wheezing or shortness of breath.  I will call you when we have the results of your pulmonary function test. > Knee pain       -I have prescribed you celecoxib  200 mg twice daily for the next 2 weeks.  Please continue to take your pantoprazole  with this.  I will see you again in about 2 or 3 weeks and we will go over everything again and see how this medication is working.    I have ordered the following medication/changed the following medications:   Stop the following medications: There are no discontinued medications.   Start the following medications: Meds ordered this encounter  Medications   Tiotropium Bromide-Olodaterol (STIOLTO RESPIMAT ) 2.5-2.5 MCG/ACT AERS    Sig: Inhale 2 puffs into the lungs daily.    Dispense:  4 g    Refill:  3   albuterol  (VENTOLIN  HFA) 108 (90 Base) MCG/ACT inhaler    Sig: Inhale 2 puffs into the lungs every 6 (six) hours as needed for wheezing or shortness of breath.    Dispense:  8 g    Refill:  2    celecoxib  (CELEBREX ) 200 MG capsule    Sig: Take 1 capsule (200 mg total) by mouth 2 (two) times daily.    Dispense:  28 capsule    Refill:  0   chlordiazePOXIDE  (LIBRIUM ) 25 MG capsule    Sig: Take 1 capsule (25 mg total) by mouth 3 (three) times daily for 1 day, THEN 1 capsule (25 mg total) 2 (two) times daily for 2 days, THEN 1 capsule (25 mg total) daily for 2 days.    Dispense:  9 capsule    Refill:  0      Follow up:  2-3 weeks      Remember:     Should you have any questions or concerns please call the internal medicine clinic at [phone].     Cleven Dallas, DO [facility]

## 2023-08-14 DIAGNOSIS — L709 Acne, unspecified: Secondary | ICD-10-CM | POA: Insufficient documentation

## 2023-08-14 MED ORDER — NALTREXONE HCL 50 MG PO TABS: 50.0000 mg | ORAL_TABLET | Freq: Every day | ORAL | 1 refills | Status: DC

## 2023-08-14 NOTE — Assessment & Plan Note (Signed)
Patient continues to drink alcohol but has cut out drinking liquor.  He primarily drinks beer and is even started drinking nonalcoholic beer.  He has noticed mild withdrawal symptoms including irritable mood and mild tremor at about 24 hours after his last drink.  He is interested in alcohol cessation and has previously done alcohol detox at a rehab facility but he does not remember what medications they gave him.  He did not have any severe withdrawal symptoms during that detox.Marland Kitchen  He has no history of severe withdrawal.  We discussed outpatient Librium taper and he is confident that he can manage this especially with the help of his fiance who he lives with.  Discussed in depth the signs of severe alcohol withdrawal and strict return precautions. - Start Librium 25 mg 3 times daily for 1 day, twice daily for 2 days, and daily for 2 days - Start naltrexone 50 mg daily - Return in 2-3 weeks for reevaluation

## 2023-08-14 NOTE — Assessment & Plan Note (Signed)
At the end of the visit the patient wanted to discuss issues with acne on his face.  Unfortunately there is no time to fully evaluate but he does have comedones on his face primarily in the maxillary and frontal regions.  We will discuss this further at his next visit if time allows.

## 2023-08-14 NOTE — Assessment & Plan Note (Signed)
Please see alcohol use disorder/alcohol withdrawal for further information.  Overall alcohol cirrhosis is stable without ascites or other signs of decompensation.  We will continue to monitor for signs or symptoms of decompensation.

## 2023-08-14 NOTE — Progress Notes (Signed)
Internal Medicine Clinic Attending  Case discussed with the resident at the time of the visit.  We reviewed the resident's history and exam and pertinent patient test results.  I agree with the assessment, diagnosis, and plan of care documented in the resident's note.

## 2023-08-14 NOTE — Assessment & Plan Note (Signed)
Patient has chronic left leg pain after a traumatic injury many years ago.  Since then he has favored his right leg when ambulating and has slowly progressive right knee pain.  On exam there is normal range of motion, no focal tenderness, no effusion, and ligamentous testing did not show any abnormalities.  Overall most consistent with osteoarthritis and we will try celecoxib. - Celecoxib 200 mg twice daily for 14 days - Reevaluate on next visit, consider knee injections

## 2023-08-14 NOTE — Assessment & Plan Note (Signed)
Last CBC 05/17/2023 with stable thrombocytopenia with platelet count of 59,000.  We will continue to monitor and repeat CBC in about 3 months.

## 2023-08-14 NOTE — Assessment & Plan Note (Signed)
CT chest with contrast 05/07/2023 with stable findings of a small spiculated nodular area in the right suprahilar lesion.  Recommended repeat surveillance CT in 3 to 6 months which corresponds to February-May of this year.  Plan for patient to return in 2-3 weeks and we will discuss scheduling this at that appointment.

## 2023-08-14 NOTE — Assessment & Plan Note (Signed)
Patient has stable bothersome dyspnea on exertion for the past several months.  He has been getting short of breath walking on flat ground with distances less than 1 block.  He has a strong smoking history with evidence of emphysema on his last CT scan, no concurrent heart failure symptoms, no acute changes in symptoms.  On exam he had some mild wheezing but otherwise no significant abnormalities.  Overall this is consistent with Gold group B COPD but he has not yet had PFTs.  We will order PFTs and start him on a LABA/LAMA plus as needed [place]. - PFTs - Stiolto 1 puff daily and albuterol inhaler every 6 hours as needed

## 2023-09-11 ENCOUNTER — Other Ambulatory Visit: Payer: Self-pay | Admitting: Student

## 2023-09-11 DIAGNOSIS — M79605 Pain in left leg: Secondary | ICD-10-CM

## 2023-09-12 NOTE — Telephone Encounter (Signed)
 Medication discontinued on 03/01/23, patient is taking Atorvastatin 20 mg.

## 2023-09-25 ENCOUNTER — Telehealth: Payer: Self-pay | Admitting: *Deleted

## 2023-09-25 NOTE — Telephone Encounter (Signed)
 Call from [facility] Nurse care Manager from [facility] called with patient to check on referral to Detox.  Patient does not have a referral in as of yet for the referral .  Was to have started on Librium in February.  Not sure if he has indeed started. Confusion about meds.  Patient was given an appointment for tomorrow morning to start the process for Detox.  Spoke with [facility] is available at [facility].

## 2023-09-26 ENCOUNTER — Ambulatory Visit: Payer: Self-pay | Admitting: Student

## 2023-09-26 ENCOUNTER — Encounter: Admitting: Student

## 2023-09-26 NOTE — Telephone Encounter (Signed)
 Copied from CRM [id number]. Topic: Appointments - Appointment Cancel/Reschedule >> Sep 26, 2023 10:20 AM Everette Rank wrote: Patient/patient representative is calling to cancel or reschedule an appointment. Refer to attachments for appointment information.  Patient called in to let us know cancel app due to having spasms. Will call back to reschedule FYI for office

## 2023-10-26 ENCOUNTER — Ambulatory Visit: Payer: Self-pay

## 2023-10-26 NOTE — Telephone Encounter (Signed)
 Copied from CRM [id number]. Topic: Clinical - Red Word Triage >> Oct 26, 2023  4:43 PM Karole Pacer C wrote: Red Word that prompted transfer to Nurse Triage: Patient states when he has intercourse he has noticed blood in his semen.  Chief Complaint: penis s/s  Symptoms: bright red blood comes from penis during ejaculation of semen, fever,  Frequency: x 3 to 4 days Pertinent Negatives: Patient denies n/v, painful urination Disposition: [] ED /[] Urgent Care (no appt availability in office) / [x] Appointment(In office/virtual)/ []  [place] [facility]/ [] Home Care/ [] Refused Recommended Disposition /[] [facility]/ []  Follow-up with PCP Additional Notes: pt is heavy beer drinker and use to drink a lot of liquor.  Pt states sometimes has pain in left side/kidney 7-8/10 pain and comes and goes.  Pt stated also have a fever on and ofr.  Pt stated the blood was noted by his wife after sexual intercourse on 2 occassions.  scheduled in office visit for evaluation and tx  Reason for Disposition . Blood in semen  Answer Assessment - Initial Assessment Questions 1. SYMPTOM: "What's the main symptom you're concerned about?" (e.g., discharge from penis, rash, pain, itching, swelling)     Red Blood 2. LOCATION: "Where is the  located?"     Coming from penis 3. ONSET: "When did   start?"     X 3 to 4 days 4. PAIN: "Is there any pain?" If Yes, ask: "How bad is it?"  (Scale 1-10; or mild, moderate, severe)     none 5. URINE: "Any difficulty passing urine?" If Yes, ask: "When was the last time?"     no 6. CAUSE: "What do you think is causing the symptoms?"     unknown 7. OTHER SYMPTOMS: "Do you have any other symptoms?" (e.g., fever, abdomen pain, blood in urine)     Left kidney area pain 7 - 8 /10 comes and goes  Protocols used: Penis and Scrotum Symptoms-A-AH

## 2023-10-29 ENCOUNTER — Other Ambulatory Visit (HOSPITAL_COMMUNITY)
Admission: RE | Admit: 2023-10-29 | Discharge: 2023-10-29 | Disposition: A | Discharge status: Home / Self Care | Source: Ambulatory Visit | Attending: Internal Medicine | Admitting: Internal Medicine

## 2023-10-29 ENCOUNTER — Other Ambulatory Visit (HOSPITAL_COMMUNITY): Payer: Self-pay

## 2023-10-29 ENCOUNTER — Ambulatory Visit: Payer: Self-pay | Admitting: Student

## 2023-10-29 VITALS — BP 136/78 | HR 99 | Temp 98.7°F | Ht 67.0 in | Wt 137.6 lb

## 2023-10-29 DIAGNOSIS — M79604 Pain in right leg: Secondary | ICD-10-CM

## 2023-10-29 DIAGNOSIS — K7030 Alcoholic cirrhosis of liver without ascites: Secondary | ICD-10-CM | POA: Diagnosis not present

## 2023-10-29 DIAGNOSIS — R361 Hematospermia: Secondary | ICD-10-CM | POA: Diagnosis present

## 2023-10-29 DIAGNOSIS — M79605 Pain in left leg: Secondary | ICD-10-CM

## 2023-10-29 DIAGNOSIS — R0609 Other forms of dyspnea: Secondary | ICD-10-CM

## 2023-10-29 MED ORDER — STIOLTO RESPIMAT 2.5-2.5 MCG/ACT IN AERS
2.0000 | INHALATION_SPRAY | Freq: Every day | RESPIRATORY_TRACT | 3 refills | Status: DC
Start: 2023-10-29 — End: 2023-10-29

## 2023-10-29 MED ORDER — ALBUTEROL SULFATE HFA 108 (90 BASE) MCG/ACT IN AERS
2.0000 | INHALATION_SPRAY | Freq: Four times a day (QID) | RESPIRATORY_TRACT | 2 refills | Status: DC | PRN
  Filled 2023-10-29 (×3): qty 6.7, 25d supply, fill #0

## 2023-10-29 MED ORDER — ALBUTEROL SULFATE HFA 108 (90 BASE) MCG/ACT IN AERS
2.0000 | INHALATION_SPRAY | Freq: Four times a day (QID) | RESPIRATORY_TRACT | 2 refills | Status: DC | PRN
Start: 2023-10-29 — End: 2023-10-29

## 2023-10-29 MED ORDER — STIOLTO RESPIMAT 2.5-2.5 MCG/ACT IN AERS
2.0000 | INHALATION_SPRAY | Freq: Every day | RESPIRATORY_TRACT | 3 refills | Status: DC
Start: 2023-10-29 — End: 2024-05-08
  Filled 2023-10-29 (×3): qty 4, 30d supply, fill #0

## 2023-10-29 NOTE — Telephone Encounter (Signed)
 Pt has an appt this afternoon w/Dr Sharlon Deacon.

## 2023-10-29 NOTE — Progress Notes (Unsigned)
 Established Patient Office Visit  Subjective   Patient ID: Dylan Skains., male    DOB: [birth year]  Age: 58 y.o. MRN: [id number]  Chief Complaint  Patient presents with   Follow-up    Medication refill / blood in urine x 1 week / chronic bilateral leg pain.    [name]. is a 58 y.o. who presents to the clinic for hematospermia and chronic bilateral knee pain. Please see problem based assessment and plan for additional details.   Patient Active Problem List   Diagnosis Date Noted   Hematospermia 10/30/2023   Acne 08/14/2023   Lung nodule 07/31/2022   Left knee pain 09/27/2021   Alcoholic cirrhosis (HCC) 05/18/2021   Dyspnea on exertion 05/18/2021   Healthcare maintenance 01/18/2021   Abnormal chest x-ray 01/17/2021   Gynecomastia 01/17/2021   Heroin use disorder, mild, in early remission, on maintenance therapy, abuse (HCC) 01/17/2021   Hyperbilirubinemia 11/08/2020   Tachycardia 10/06/2020   Syncope 09/27/2020   Vitamin D  deficiency 07/06/2020   Right hip pain 07/02/2020   Alcohol use disorder, moderate, dependence (HCC) 02/13/2020   Thrombocytopenia (HCC) 02/13/2020   Paresthesia 05/15/2019   Pain in both lower extremities 05/15/2019   Bowel incontinence 04/14/2019   Gait abnormality 04/14/2019   Macrocytic anemia 03/14/2018   Left leg pain 01/21/2017   Essential hypertension 01/21/2017   MDD (major depressive disorder) 03/25/2010   Erectile dysfunction 05/14/2008   GERD 04/13/2008   Tobacco use disorder 03/11/2008      Objective:  BP 136/78 (BP Location: Right Arm, Patient Position: Sitting, Cuff Size: Normal)   Pulse 99   Temp 98.7 F (37.1 C) (Oral)   Ht 5\' 7"  (1.702 m)   Wt 137 lb 9.6 oz (62.4 kg)   SpO2 100%   BMI 21.55 kg/m  BP Readings from Last 3 Encounters:  10/29/23 136/78  08/13/23 133/89  05/17/23 (!) 149/92   Wt Readings from Last 3 Encounters:  10/29/23 137 lb 9.6 oz (62.4 kg)  08/13/23 138 lb 11.2 oz (62.9 kg)  05/17/23  142 lb 9.6 oz (64.7 kg)      Physical Exam Vitals reviewed. Exam conducted with a chaperone present.  Constitutional:      General: He is not in acute distress.    Appearance: He is not toxic-appearing.  Cardiovascular:     Rate and Rhythm: Normal rate and regular rhythm.     Heart sounds: Normal heart sounds. No murmur heard. Pulmonary:     Effort: Pulmonary effort is normal. No respiratory distress.     Breath sounds: Normal breath sounds. No stridor. No wheezing or rales.  Abdominal:     General: There is no distension.     Palpations: Abdomen is soft.     Tenderness: There is no guarding.  Genitourinary:    Penis: No erythema, tenderness, discharge, swelling or lesions.      Testes:        Right: Mass, tenderness or swelling not present.        Left: Mass, tenderness or swelling not present.     Epididymis:     Right: No tenderness.     Left: No tenderness.     Comments: Chaperone present during exam: Jada  Small skin tag present on ventral surface of penis  Musculoskeletal:     Right knee: No crepitus.     Left knee: No crepitus.     Right lower leg: No edema.     Left  lower leg: No edema.     Comments: McMurray exam limited by patient's inability to relax [facility] exam was normal  Tenderness along the bilateral joint line   Skin:    General: Skin is warm and dry.  Neurological:     Mental Status: He is alert.  Psychiatric:        Mood and Affect: Mood and affect normal.        Behavior: Behavior normal. Behavior is cooperative.    Last CBC Lab Results  Component Value Date   WBC 3.9 10/29/2023   HGB 15.0 10/29/2023   HCT 43.9 10/29/2023   MCV 105 (H) 10/29/2023   MCH 35.9 (H) 10/29/2023   RDW 13.0 10/29/2023   PLT 59 (LL) 10/29/2023   Last metabolic panel Lab Results  Component Value Date   GLUCOSE WILL FOLLOW 10/29/2023   NA WILL FOLLOW 10/29/2023   K WILL FOLLOW 10/29/2023   CL WILL FOLLOW 10/29/2023   CO2 WILL FOLLOW 10/29/2023   BUN WILL  FOLLOW 10/29/2023   CREATININE WILL FOLLOW 10/29/2023   GFRNONAA >60 05/17/2023   CALCIUM  WILL FOLLOW 10/29/2023   PHOS 2.9 09/01/2021   PROT WILL FOLLOW 10/29/2023   ALBUMIN WILL FOLLOW 10/29/2023   LABGLOB WILL FOLLOW 10/29/2023   AGRATIO 1.3 02/27/2022   BILITOT WILL FOLLOW 10/29/2023   ALKPHOS WILL FOLLOW 10/29/2023   AST WILL FOLLOW 10/29/2023   ALT WILL FOLLOW 10/29/2023   ANIONGAP 9 05/17/2023   Last lipids Lab Results  Component Value Date   CHOL 138 02/11/2022   HDL 46 02/11/2022   LDLCALC 75 02/11/2022   TRIG 85 02/11/2022   CHOLHDL 3.0 02/11/2022   Last hemoglobin A1c Lab Results  Component Value Date   HGBA1C 4.5 (L) 02/11/2022      The 10-year ASCVD risk score (Arnett DK, et al., 2019) is: 21.8%    Assessment & Plan:   Problem List Items Addressed This Visit       Digestive   Alcoholic cirrhosis (HCC)   Relevant Orders   CMP14 + Anion Gap (Completed)     Other   Pain in both lower extremities   Patient continues to report chronic bilateral knee pain and muscle cramping at night. The knee pain is progressing causing him to wear three knee braces on his knees.   Exam was negative for meniscal injury during both McMurray's and thesslay exam with tenderness along the joint line.   Plan: -Bilateral knee xrays for assessing OA -CMP to check for electrolyte abnormalities -Iron panel        Relevant Orders   Iron, TIBC and Ferritin Panel (Completed)   DG Knee Complete 4 Views Right   DG Knee Complete 4 Views Left   Dyspnea on exertion   Relevant Medications   albuterol  (VENTOLIN  HFA) 108 (90 Base) MCG/ACT inhaler   Tiotropium Bromide-Olodaterol (STIOLTO RESPIMAT ) 2.5-2.5 MCG/ACT AERS   Hematospermia - Primary   Patient presenting to the clinic with acute concern of hematospermia that started 1 week ago.  He reports that he has had sex intercourse three times over the past week and each time, there was blood in his semen.  He denies any  testicular, penile or pain transsexual intercourse.  He denies any new acute lesions.  He denies recent procedures the penis or prostate biopsy.  He does report chronic nocturia, difficulty emptying his bladder, weak stream of urine, and increased frequency of urination throughout the day accompanied with urgency.  He denies dysuria.  He has low concerns for STIs at this point.  He denies hematuria as well.  Physical exam of the penis and testicles is unremarkable did not identify source of hematospermia.  Patient collected a urinalysis and the urine color was pink/amber.  Patient reports that his urine has been discolored for "a few weeks now".  Plan: -U/A ordered: appears he has an UTI, will send in Bactrim BID for 7 days. Urine culture ordered  -CBC ordered which reveals chronic thrombocytopenia of 59 K -CMP ordered -G/C STI testing ordered  -PSA -Urology referral       Relevant Orders   CBC no Diff (Completed)   Urinalysis, Reflex Microscopic (Completed)   CMP14 + Anion Gap (Completed)   PSA (Completed)   Urine cytology ancillary only   Culture, Urine   Ambulatory referral to Urology    Return if symptoms worsen or fail to improve.    Aurora Lees, DO

## 2023-10-29 NOTE — Patient Instructions (Signed)
 Thank you, [name] for allowing us  to provide your care today. Today we discussed blood in the semen. We have ordered labs to see what may be causing this. Depending on the labs results, we will likely send you to see a urologist (penis doctor).     For your knee pain, avoid ibuprofen  and other NSAIDs. You may use tylenol  at a maximum dose of 2,000mg  a day.   I have ordered the following labs for you:   Lab Orders         CBC no Diff         Urinalysis, Reflex Microscopic         CMP14 + Anion Gap         PSA         Iron, TIBC and Ferritin Panel       I have ordered the following medication/changed the following medications:   Stop the following medications: Medications Discontinued During This Encounter  Medication Reason   Tiotropium Bromide-Olodaterol (STIOLTO RESPIMAT ) 2.5-2.5 MCG/ACT AERS Reorder   albuterol  (VENTOLIN  HFA) 108 (90 Base) MCG/ACT inhaler Reorder     Start the following medications: Meds ordered this encounter  Medications   albuterol  (VENTOLIN  HFA) 108 (90 Base) MCG/ACT inhaler    Sig: Inhale 2 puffs into the lungs every 6 (six) hours as needed for wheezing or shortness of breath.    Dispense:  6.7 g    Refill:  2   Tiotropium Bromide-Olodaterol (STIOLTO RESPIMAT ) 2.5-2.5 MCG/ACT AERS    Sig: Inhale 2 puffs into the lungs daily.    Dispense:  4 g    Refill:  3     Follow up: As needed if symptoms continue or worsen     Should you have any questions or concerns please call the internal medicine clinic at [phone].     Please note that our late policy has changed.  If you are more than 15 minutes late to your appointment, you may be asked to reschedule your appointment.  Dr. Sharlon Deacon, D.O. [facility]

## 2023-10-30 ENCOUNTER — Other Ambulatory Visit: Payer: Self-pay | Admitting: Student

## 2023-10-30 ENCOUNTER — Other Ambulatory Visit (HOSPITAL_COMMUNITY): Payer: Self-pay

## 2023-10-30 ENCOUNTER — Other Ambulatory Visit: Payer: Self-pay

## 2023-10-30 DIAGNOSIS — R361 Hematospermia: Secondary | ICD-10-CM | POA: Insufficient documentation

## 2023-10-30 DIAGNOSIS — D696 Thrombocytopenia, unspecified: Secondary | ICD-10-CM

## 2023-10-30 LAB — URINALYSIS, ROUTINE W REFLEX MICROSCOPIC
Glucose, UA: NEGATIVE
Nitrite, UA: POSITIVE — AB
Protein,UA: NEGATIVE
Specific Gravity, UA: 1.022 (ref 1.005–1.030)
Urobilinogen, Ur: 1 mg/dL (ref 0.2–1.0)
pH, UA: 5 (ref 5.0–7.5)

## 2023-10-30 LAB — URINE CYTOLOGY ANCILLARY ONLY
Chlamydia: NEGATIVE
Comment: NEGATIVE
Comment: NORMAL
Neisseria Gonorrhea: NEGATIVE

## 2023-10-30 LAB — MICROSCOPIC EXAMINATION: Casts: NONE SEEN /LPF

## 2023-10-30 MED ORDER — SULFAMETHOXAZOLE-TRIMETHOPRIM 800-160 MG PO TABS
1.0000 | ORAL_TABLET | Freq: Two times a day (BID) | ORAL | 0 refills | Status: AC
  Filled 2023-10-30: qty 14, 7d supply, fill #0

## 2023-10-30 NOTE — Assessment & Plan Note (Signed)
 Patient presenting to the clinic with acute concern of hematospermia that started 1 week ago.  He reports that he has had sex intercourse three times over the past week and each time, there was blood in his semen.  He denies any testicular, penile or pain transsexual intercourse.  He denies any new acute lesions.  He denies recent procedures the penis or prostate biopsy.  He does report chronic nocturia, difficulty emptying his bladder, weak stream of urine, and increased frequency of urination throughout the day accompanied with urgency.  He denies dysuria.  He has low concerns for STIs at this point.  He denies hematuria as well.  Physical exam of the penis and testicles is unremarkable did not identify source of hematospermia.  Patient collected a urinalysis and the urine color was pink/amber.  Patient reports that his urine has been discolored for "a few weeks now".  Plan: -U/A ordered: appears he has an UTI, will send in Bactrim BID for 7 days. Urine culture ordered  -CBC ordered which reveals chronic thrombocytopenia of 59 K -CMP ordered -G/C STI testing ordered  -PSA -Urology referral

## 2023-10-30 NOTE — Progress Notes (Signed)
 Contacted [facility] to add a urine culture to previous collection. [facility] confirmed that a smear was done for the thrombocytopenia and the findings were listed as:  "Actual platelet count may be somewhat higher than reported due to aggregation of platelets in this sample "

## 2023-10-30 NOTE — Assessment & Plan Note (Addendum)
 Patient continues to report chronic bilateral knee pain and muscle cramping at night. The knee pain is progressing causing him to wear three knee braces on his knees.   Exam was negative for meniscal injury during both McMurray's and thesslay exam with tenderness along the joint line.   Plan: -Bilateral knee xrays for assessing OA -CMP to check for electrolyte abnormalities -Iron panel

## 2023-10-31 LAB — CMP14 + ANION GAP
ALT: 30 IU/L (ref 0–44)
AST: 77 IU/L — ABNORMAL HIGH (ref 0–40)
Albumin: 4.4 g/dL (ref 3.8–4.9)
Alkaline Phosphatase: 150 IU/L — ABNORMAL HIGH (ref 44–121)
Anion Gap: 19 mmol/L — ABNORMAL HIGH (ref 10.0–18.0)
BUN/Creatinine Ratio: 12 (ref 9–20)
BUN: 9 mg/dL (ref 6–24)
Bilirubin Total: 1.2 mg/dL (ref 0.0–1.2)
CO2: 23 mmol/L (ref 20–29)
Calcium: 8.9 mg/dL (ref 8.7–10.2)
Chloride: 99 mmol/L (ref 96–106)
Creatinine, Ser: 0.74 mg/dL — ABNORMAL LOW (ref 0.76–1.27)
Globulin, Total: 3.7 g/dL (ref 1.5–4.5)
Glucose: 97 mg/dL (ref 70–99)
Potassium: 3.3 mmol/L — ABNORMAL LOW (ref 3.5–5.2)
Sodium: 141 mmol/L (ref 134–144)
Total Protein: 8.1 g/dL (ref 6.0–8.5)
eGFR: 104 mL/min/{1.73_m2} (ref >59)

## 2023-10-31 LAB — CBC
Hematocrit: 43.9 % (ref 37.5–51.0)
Hemoglobin: 15 g/dL (ref 13.0–17.7)
MCH: 35.9 pg — ABNORMAL HIGH (ref 26.6–33.0)
MCHC: 34.2 g/dL (ref 31.5–35.7)
MCV: 105 fL — ABNORMAL HIGH (ref 79–97)
Platelets: 59 10*3/uL — CL (ref 150–450)
RBC: 4.18 x10E6/uL (ref 4.14–5.80)
RDW: 13 % (ref 11.6–15.4)
WBC: 3.9 10*3/uL (ref 3.4–10.8)

## 2023-10-31 LAB — IRON,TIBC AND FERRITIN PANEL
Ferritin: 364 ng/mL (ref 30–400)
Iron Saturation: 38 % (ref 15–55)
Iron: 158 ug/dL (ref 38–169)
Total Iron Binding Capacity: 419 ug/dL (ref 250–450)
UIBC: 261 ug/dL (ref 111–343)

## 2023-10-31 LAB — PSA: Prostate Specific Ag, Serum: 0.5 ng/mL (ref 0.0–4.0)

## 2023-10-31 NOTE — Progress Notes (Signed)
 Internal Medicine Clinic Attending  Case discussed with the resident at the time of the visit.  We reviewed the resident's history and exam and pertinent patient test results.  I agree with the assessment, diagnosis, and plan of care documented in the resident's note.

## 2023-11-02 LAB — SPECIMEN STATUS REPORT

## 2023-11-02 LAB — URINE CULTURE

## 2023-11-07 ENCOUNTER — Other Ambulatory Visit: Payer: Self-pay | Admitting: Student

## 2023-11-07 DIAGNOSIS — M79605 Pain in left leg: Secondary | ICD-10-CM

## 2023-11-08 NOTE — Telephone Encounter (Signed)
 Patient is no longer taking atorvastatin  40 mg. Patient is taking atorvastatin  20 mg.

## 2023-11-09 ENCOUNTER — Other Ambulatory Visit (HOSPITAL_COMMUNITY): Payer: Self-pay

## 2023-11-10 ENCOUNTER — Other Ambulatory Visit: Payer: Self-pay | Admitting: Student

## 2023-11-10 DIAGNOSIS — F3342 Major depressive disorder, recurrent, in full remission: Secondary | ICD-10-CM

## 2023-11-12 NOTE — Telephone Encounter (Signed)
 Medication sent to pharmacy

## 2023-11-20 ENCOUNTER — Other Ambulatory Visit: Payer: Self-pay | Admitting: Student

## 2023-11-30 ENCOUNTER — Other Ambulatory Visit: Payer: Self-pay | Admitting: Student

## 2023-11-30 DIAGNOSIS — F1020 Alcohol dependence, uncomplicated: Secondary | ICD-10-CM

## 2023-12-26 ENCOUNTER — Other Ambulatory Visit: Payer: Self-pay | Admitting: Student

## 2023-12-26 DIAGNOSIS — M79605 Pain in left leg: Secondary | ICD-10-CM

## 2023-12-26 NOTE — Telephone Encounter (Signed)
 Medication discontinued 03/01/23.

## 2024-01-23 ENCOUNTER — Other Ambulatory Visit: Payer: Self-pay | Admitting: Student

## 2024-01-23 DIAGNOSIS — F1020 Alcohol dependence, uncomplicated: Secondary | ICD-10-CM

## 2024-01-30 ENCOUNTER — Other Ambulatory Visit: Payer: Self-pay | Admitting: Student

## 2024-01-30 ENCOUNTER — Other Ambulatory Visit: Payer: Self-pay | Admitting: Internal Medicine

## 2024-01-30 DIAGNOSIS — E785 Hyperlipidemia, unspecified: Secondary | ICD-10-CM

## 2024-04-07 ENCOUNTER — Ambulatory Visit: Admitting: Student

## 2024-04-07 NOTE — Progress Notes (Deleted)
 Hematospermia - Primary     Patient presenting to the clinic with acute concern of hematospermia that started 1 week ago.  He reports that he has had sex intercourse three times over the past week and each time, there was blood in his semen.  He denies any testicular, penile or pain transsexual intercourse.  He denies any new acute lesions.  He denies recent procedures the penis or prostate biopsy.  He does report chronic nocturia, difficulty emptying his bladder, weak stream of urine, and increased frequency of urination throughout the day accompanied with urgency.  He denies dysuria.  He has low concerns for STIs at this point.  He denies hematuria as well.   Physical exam of the penis and testicles is unremarkable did not identify source of hematospermia.  Patient collected a urinalysis and the urine color was pink/amber.  Patient reports that his urine has been discolored for a few weeks now.   Plan: -U/A ordered: appears he has an UTI, will send in Bactrim  BID for 7 days. Urine culture ordered  -CBC ordered which reveals chronic thrombocytopenia of 59 K -CMP ordered -G/C STI testing ordered  -PSA -Urology referral         08/13/2023 visit with me Lung nodule CT chest with contrast 05/07/2023 with stable findings of a small spiculated nodular area in the right suprahilar lesion.  Recommended repeat surveillance CT in 3 to 6 months which corresponds to February-May of this year.  Plan for patient to return in 2-3 weeks and we will discuss scheduling this at that appointment. ***Needs this ordered last imaging 05/2023   Alcoholic cirrhosis (HCC) Please see alcohol use disorder/alcohol withdrawal for further information.  Overall alcohol cirrhosis is stable without ascites or other signs of decompensation.  We will continue to monitor for signs or symptoms of decompensation.   Thrombocytopenia (HCC) Last CBC 05/17/2023 with stable thrombocytopenia with platelet count of 59,000.  We will  continue to monitor and repeat CBC in about 3 months.   Alcohol use disorder, moderate, dependence (HCC) Patient continues to drink alcohol but has cut out drinking liquor.  He primarily drinks beer and is even started drinking nonalcoholic beer.  He has noticed mild withdrawal symptoms including irritable mood and mild tremor at about 24 hours after his last drink.  He is interested in alcohol cessation and has previously done alcohol detox at a rehab facility but he does not remember what medications they gave him.  He did not have any severe withdrawal symptoms during that detox.SABRA  He has no history of severe withdrawal.  We discussed outpatient Librium  taper and he is confident that he can manage this especially with the help of his fiance who he lives with.  Discussed in depth the signs of severe alcohol withdrawal and strict return precautions. - Start Librium  25 mg 3 times daily for 1 day, twice daily for 2 days, and daily for 2 days - Start naltrexone  50 mg daily - Return in 2-3 weeks for reevaluation ***   Dyspnea on exertion Patient has stable bothersome dyspnea on exertion for the past several months.  He has been getting short of breath walking on flat ground with distances less than 1 block.  He has a strong smoking history with evidence of emphysema on his last CT scan, no concurrent heart failure symptoms, no acute changes in symptoms.  On exam he had some mild wheezing but otherwise no significant abnormalities.  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  We will order PFTs and start him on a LABA/LAMA plus as needed [place]. - PFTs - Stiolto 1 puff daily and albuterol  inhaler every 6 hours as needed   Acne At the end of the visit the patient wanted to discuss issues with acne on his face.  Unfortunately there is no time to fully evaluate but he does have comedones on his face primarily in the maxillary and frontal regions.  We will discuss this  further at his next visit if time allows.   Left knee pain Patient has chronic left leg pain after a traumatic injury many years ago.  Since then he has favored his right leg when ambulating and has slowly progressive right knee pain.  On exam there is normal range of motion, no focal tenderness, no effusion, and ligamentous testing did not show any abnormalities.  Overall most consistent with osteoarthritis and we will try celecoxib . - Celecoxib  200 mg twice daily for 14 days - Reevaluate on next visit, consider knee injections   CC: Routine Follow Up for management of chronic medical conditions after last office visit 10/29/2023  HPI:  [name]. is a 58 y.o. male with pertinent PMH of hypertension, alcohol use disorder with cirrhosis, MDD, TUD, and a lung nodule who presents as above. Please see assessment and plan below for further details.  Medications: Current Outpatient Medications  Medication Instructions   albuterol  (VENTOLIN  HFA) 108 (90 Base) MCG/ACT inhaler 2 puffs, Inhalation, Every 6 hours PRN   atorvastatin  (LIPITOR) 20 mg, Oral, Daily   DULoxetine  (CYMBALTA ) 60 mg, Oral, Daily   folic acid  (FOLVITE ) 1 mg, Oral, Daily   furosemide  (LASIX ) 40 mg, Oral, Daily   gabapentin  (NEURONTIN ) 300 mg, Oral, 3 times daily   naltrexone  (DEPADE) 50 mg, Oral, Daily   nicotine  polacrilex (NICORETTE ) 2 mg, Oral, As needed   pantoprazole  (PROTONIX ) 40 mg, Oral, Daily   sildenafil  (VIAGRA ) 50 MG tablet TAKE 1/2 TABLET BY MOUTH ONE HOUR prior TO sexual activity as needed FOR erectile dysfunction   spironolactone  (ALDACTONE ) 100 mg, Oral, Daily   Tiotropium Bromide-Olodaterol (STIOLTO RESPIMAT ) 2.5-2.5 MCG/ACT AERS 2 puffs, Inhalation, Daily     Review of Systems:   Pertinent items noted in HPI and/or A&P.  Physical Exam:  There were no vitals filed for this visit.  Constitutional:***. In no acute distress. HEENT: Normocephalic, atraumatic, Sclera non-icteric, PERRL, EOM  intact Cardio:Regular rate and rhythm. 2+ bilateral {PulseLoc:28294} pulses. Pulm:Clear to auscultation bilaterally. Normal work of breathing on room air. Abdomen: Soft, non-tender, non-distended, positive bowel sounds. FDX:Wzhjupcz for extremity edema. Skin:Warm and dry. Neuro:Alert and oriented x3. No focal deficit noted. Psych:Pleasant mood and affect.   Assessment & Plan:   Assessment & Plan   No orders of the defined types were placed in this encounter.    Patient {GC/GE:3044014::discussed with,seen with} {JGIMTSattending2025/2026:32954}  Fairy Pool, DO [facility] Internal Medicine Resident PGY-3 Clinic Phone: [phone] Please contact the on call pager at [phone] for any urgent or emergent needs.

## 2024-04-21 ENCOUNTER — Ambulatory Visit: Admitting: Student

## 2024-05-08 ENCOUNTER — Ambulatory Visit: Payer: Self-pay | Admitting: Student

## 2024-05-08 VITALS — BP 120/81 | HR 110 | Temp 98.2°F | Ht 67.0 in | Wt 137.8 lb

## 2024-05-08 DIAGNOSIS — F1020 Alcohol dependence, uncomplicated: Secondary | ICD-10-CM | POA: Diagnosis not present

## 2024-05-08 DIAGNOSIS — G8929 Other chronic pain: Secondary | ICD-10-CM

## 2024-05-08 DIAGNOSIS — M545 Low back pain, unspecified: Secondary | ICD-10-CM | POA: Diagnosis not present

## 2024-05-08 DIAGNOSIS — D696 Thrombocytopenia, unspecified: Secondary | ICD-10-CM

## 2024-05-08 DIAGNOSIS — R0609 Other forms of dyspnea: Secondary | ICD-10-CM | POA: Diagnosis not present

## 2024-05-08 DIAGNOSIS — Z23 Encounter for immunization: Secondary | ICD-10-CM | POA: Diagnosis not present

## 2024-05-08 DIAGNOSIS — Z79899 Other long term (current) drug therapy: Secondary | ICD-10-CM

## 2024-05-08 DIAGNOSIS — K7030 Alcoholic cirrhosis of liver without ascites: Secondary | ICD-10-CM | POA: Diagnosis not present

## 2024-05-08 DIAGNOSIS — F17210 Nicotine dependence, cigarettes, uncomplicated: Secondary | ICD-10-CM

## 2024-05-08 MED ORDER — PANTOPRAZOLE SODIUM 40 MG PO TBEC: 40.0000 mg | DELAYED_RELEASE_TABLET | Freq: Every day | ORAL | 2 refills | Status: DC

## 2024-05-08 MED ORDER — STIOLTO RESPIMAT 2.5-2.5 MCG/ACT IN AERS: 2.0000 | INHALATION_SPRAY | Freq: Every day | RESPIRATORY_TRACT | 3 refills | Status: DC

## 2024-05-08 MED ORDER — ALBUTEROL SULFATE HFA 108 (90 BASE) MCG/ACT IN AERS: 2.0000 | INHALATION_SPRAY | Freq: Four times a day (QID) | RESPIRATORY_TRACT | 2 refills | Status: DC | PRN

## 2024-05-08 MED ORDER — NALTREXONE HCL 50 MG PO TABS: 50.0000 mg | ORAL_TABLET | Freq: Every day | ORAL | 11 refills | Status: DC

## 2024-05-08 MED ORDER — LIDOCAINE 5 % EX PTCH: 1.0000 | MEDICATED_PATCH | CUTANEOUS | 0 refills | Status: AC

## 2024-05-08 MED ORDER — FUROSEMIDE 40 MG PO TABS: 40.0000 mg | ORAL_TABLET | Freq: Every day | ORAL | 2 refills | Status: DC
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
## 2024-05-08 NOTE — Assessment & Plan Note (Signed)
 Refilled his stiolto and albuterol 

## 2024-05-08 NOTE — Assessment & Plan Note (Signed)
 Acute right-sided back and flank pain post-fall, likely muscle strain or rib injury. - Prescribed topical pain medication.

## 2024-05-08 NOTE — Assessment & Plan Note (Signed)
 Still drinking about 4-6 beers daily. He is interested in cessation, re-prescribed naltrexone .

## 2024-05-08 NOTE — Patient Instructions (Addendum)
 Thank you so much for coming to the clinic today!   I have refilled your medications I have put in a referral to the stomach doctors    If you have any questions please feel free to the call the clinic at anytime at [phone]. It was a pleasure seeing you!  Best, Dr. Jady Braggs

## 2024-05-08 NOTE — Progress Notes (Signed)
 Internal Medicine Clinic Attending  Case discussed with the resident at the time of the visit.  We reviewed the resident's history and exam and pertinent patient test results.  I agree with the assessment, diagnosis, and plan of care documented in the resident's note.

## 2024-05-08 NOTE — Assessment & Plan Note (Addendum)
 Severe liver cirrhosis with MELD score of 14, high mortality risk. Alcohol consumption reduced but still significant. Emphasized alcohol cessation to prevent further liver damage. Mildly distended on examination today. I did have a frank discussion with him about how at risk he is given elevated Fib-4 score and continued use of alcohol. His last endoscopy in 2024 showed no varices, and he hasn't had any episodes of hematemesis. He is on spironolactone  and lasix  for management, don't see a history of SBP.  - Referred to gastroenterologist. - Encouraged complete alcohol cessation. - Refilled naltrexone  prescription. - Continuing his spironolactone  and lasix  - Rechecking CMP today

## 2024-05-08 NOTE — Assessment & Plan Note (Signed)
 Thrombocytopenia in setting of liver cirrhosis Chronic thrombocytopenia due to liver cirrhosis, increasing bleeding risk. - Ordered lab tests to recheck platelet count.

## 2024-05-09 LAB — BASIC METABOLIC PANEL WITH GFR
BUN/Creatinine Ratio: 11 (ref 9–20)
BUN: 10 mg/dL (ref 6–24)
CO2: 24 mmol/L (ref 20–29)
Calcium: 9.3 mg/dL (ref 8.7–10.2)
Chloride: 93 mmol/L — ABNORMAL LOW (ref 96–106)
Creatinine, Ser: 0.88 mg/dL (ref 0.76–1.27)
Glucose: 96 mg/dL (ref 70–99)
Potassium: 3.6 mmol/L (ref 3.5–5.2)
Sodium: 136 mmol/L (ref 134–144)
eGFR: 99 mL/min/1.73 (ref >59)

## 2024-05-09 LAB — CBC
Hematocrit: 46.1 % (ref 37.5–51.0)
Hemoglobin: 14.8 g/dL (ref 13.0–17.7)
MCH: 32.8 pg (ref 26.6–33.0)
MCHC: 32.1 g/dL (ref 31.5–35.7)
MCV: 102 fL — ABNORMAL HIGH (ref 79–97)
Platelets: 86 x10E3/uL — CL (ref 150–450)
RBC: 4.51 x10E6/uL (ref 4.14–5.80)
RDW: 12.2 % (ref 11.6–15.4)
WBC: 6.9 x10E3/uL (ref 3.4–10.8)

## 2024-05-13 NOTE — Progress Notes (Signed)
 Internal Medicine Clinic Attending  Case discussed with the resident at the time of the visit.  We reviewed the resident's history and exam and pertinent patient test results.  I agree with the assessment, diagnosis, and plan of care documented in the resident's note.

## 2024-05-15 ENCOUNTER — Ambulatory Visit: Payer: Self-pay | Admitting: Student

## 2024-05-22 ENCOUNTER — Ambulatory Visit (HOSPITAL_COMMUNITY)

## 2024-05-22 ENCOUNTER — Encounter: Payer: Self-pay | Admitting: Oncology

## 2024-05-28 ENCOUNTER — Ambulatory Visit

## 2024-05-30 ENCOUNTER — Ambulatory Visit (HOSPITAL_COMMUNITY)

## 2024-06-04 ENCOUNTER — Ambulatory Visit (HOSPITAL_COMMUNITY): Admission: RE | Admit: 2024-06-04 | Discharge: 2024-06-04 | Disposition: A | Source: Ambulatory Visit

## 2024-06-04 DIAGNOSIS — K7030 Alcoholic cirrhosis of liver without ascites: Secondary | ICD-10-CM | POA: Insufficient documentation

## 2024-07-23 ENCOUNTER — Ambulatory Visit: Admitting: *Deleted

## 2024-07-23 VITALS — Ht 67.0 in | Wt 144.0 lb

## 2024-07-23 DIAGNOSIS — Z0000 Encounter for general adult medical examination without abnormal findings: Secondary | ICD-10-CM

## 2024-07-23 NOTE — Patient Instructions (Signed)
 [name],  Thank you for taking the time for your Medicare Wellness Visit. I appreciate your continued commitment to your health goals. Please review the care plan we discussed, and feel free to reach out if I can assist you further.  Please note that Annual Wellness Visits do not include a physical exam. Some assessments may be limited, especially if the visit was conducted virtually. If needed, we may recommend an in-person follow-up with your provider.  Ongoing Care Seeing your primary care provider every 3 to 6 months helps us  monitor your health and provide consistent, personalized care.   Referrals If a referral was made during today's visit and you haven't received any updates within two weeks, please contact the referred provider directly to check on the status.  Recommended Screenings:  Health Maintenance  Topic Date Due   COVID-19 Vaccine (1) Never done   Zoster (Shingles) Vaccine (1 of 2) Never done   Pneumococcal Vaccine for age over 58 (2 of 2 - PCV) 11/22/2021   Screening for Lung Cancer  05/06/2024   Hepatitis B Vaccine (1 of 3 - Risk 3-dose series) Never done   Colon Cancer Screening  07/10/2025   Medicare Annual Wellness Visit  07/23/2025   DTaP/Tdap/Td vaccine (3 - Td or Tdap) 08/22/2026   Flu Shot  Completed   HPV Vaccine (No Doses Required) Completed   Hepatitis C Screening  Completed   HIV Screening  Completed   Meningitis B Vaccine  Aged Out       07/23/2024   11:08 AM  Advanced Directives  Does Patient Have a Medical Advance Directive? No  Would patient like information on creating a medical advance directive? No - Patient declined    Vision: Annual vision screenings are recommended for early detection of glaucoma, cataracts, and diabetic retinopathy. These exams can also reveal signs of chronic conditions such as diabetes and high blood pressure.  Dental: Annual dental screenings help detect early signs of oral cancer, gum disease, and other  conditions linked to overall health, including heart disease and diabetes.  Please see the attached documents for additional preventive care recommendations.    [name] , Thank you for taking time to come for your Medicare Wellness Visit. I appreciate your ongoing commitment to your health goals. Please review the following plan we discussed and let me know if I can assist you in the future.   Screening recommendations/referrals: Colonoscopy:  Recommended yearly ophthalmology/optometry visit for glaucoma screening and checkup Recommended yearly dental visit for hygiene and checkup  Vaccinations: Influenza vaccine:  Pneumococcal vaccine:  Tdap vaccine:  Shingles vaccine:       Preventive Care 40-64 Years, Male Preventive care refers to lifestyle choices and visits with your health care provider that can promote health and wellness. What does preventive care include? A yearly physical exam. This is also called an annual well check. Dental exams once or twice a year. Routine eye exams. Ask your health care provider how often you should have your eyes checked. Personal lifestyle choices, including: Daily care of your teeth and gums. Regular physical activity. Eating a healthy diet. Avoiding tobacco and drug use. Limiting alcohol use. Practicing safe sex. Taking low-dose aspirin  every day starting at age 68. What happens during an annual well check? The services and screenings done by your health care provider during your annual well check will depend on your age, overall health, lifestyle risk factors, and family history of disease. Counseling  Your health care provider may ask  you questions about your: Alcohol use. Tobacco use. Drug use. Emotional well-being. Home and relationship well-being. Sexual activity. Eating habits. Work and work astronomer. Screening  You may have the following tests or measurements: Height, weight, and BMI. Blood pressure. Lipid and  cholesterol levels. These may be checked every 5 years, or more frequently if you are over 56 years old. Skin check. Lung cancer screening. You may have this screening every year starting at age 46 if you have a 30-pack-year history of smoking and currently smoke or have quit within the past 15 years. Fecal occult blood test (FOBT) of the stool. You may have this test every year starting at age 85. Flexible sigmoidoscopy or colonoscopy. You may have a sigmoidoscopy every 5 years or a colonoscopy every 10 years starting at age 98. Prostate cancer screening. Recommendations will vary depending on your family history and other risks. Hepatitis C blood test. Hepatitis B blood test. Sexually transmitted disease (STD) testing. Diabetes screening. This is done by checking your blood sugar (glucose) after you have not eaten for a while (fasting). You may have this done every 1-3 years. Discuss your test results, treatment options, and if necessary, the need for more tests with your health care provider. Vaccines  Your health care provider may recommend certain vaccines, such as: Influenza vaccine. This is recommended every year. Tetanus, diphtheria, and acellular pertussis (Tdap, Td) vaccine. You may need a Td booster every 10 years. Zoster vaccine. You may need this after age 15. Pneumococcal 13-valent conjugate (PCV13) vaccine. You may need this if you have certain conditions and have not been vaccinated. Pneumococcal polysaccharide (PPSV23) vaccine. You may need one or two doses if you smoke cigarettes or if you have certain conditions. Talk to your health care provider about which screenings and vaccines you need and how often you need them. This information is not intended to replace advice given to you by your health care provider. Make sure you discuss any questions you have with your health care provider. Document Released: 07/16/2015 Document Revised: 03/08/2016 Document Reviewed:  04/20/2015 Elsevier Interactive Patient Education  2017 [employer].  Fall Prevention in the Home Falls can cause injuries. They can happen to people of all ages. There are many things you can do to make your home safe and to help prevent falls. What can I do on the outside of my home? Regularly fix the edges of walkways and driveways and fix any cracks. Remove anything that might make you trip as you walk through a door, such as a raised step or threshold. Trim any bushes or trees on the path to your home. Use bright outdoor lighting. Clear any walking paths of anything that might make someone trip, such as rocks or tools. Regularly check to see if handrails are loose or broken. Make sure that both sides of any steps have handrails. Any raised decks and porches should have guardrails on the edges. Have any leaves, snow, or ice cleared regularly. Use sand or salt on walking paths during winter. Clean up any spills in your garage right away. This includes oil or grease spills. What can I do in the bathroom? Use night lights. Install grab bars by the toilet and in the tub and shower. Do not use towel bars as grab bars. Use non-skid mats or decals in the tub or shower. If you need to sit down in the shower, use a plastic, non-slip stool. Keep the floor dry. Clean up any water that spills on the  floor as soon as it happens. Remove soap buildup in the tub or shower regularly. Attach bath mats securely with double-sided non-slip rug tape. Do not have throw rugs and other things on the floor that can make you trip. What can I do in the bedroom? Use night lights. Make sure that you have a light by your bed that is easy to reach. Do not use any sheets or blankets that are too big for your bed. They should not hang down onto the floor. Have a firm chair that has side arms. You can use this for support while you get dressed. Do not have throw rugs and other things on the floor that can make you  trip. What can I do in the kitchen? Clean up any spills right away. Avoid walking on wet floors. Keep items that you use a lot in easy-to-reach places. If you need to reach something above you, use a strong step stool that has a grab bar. Keep electrical cords out of the way. Do not use floor polish or wax that makes floors slippery. If you must use wax, use non-skid floor wax. Do not have throw rugs and other things on the floor that can make you trip. What can I do with my stairs? Do not leave any items on the stairs. Make sure that there are handrails on both sides of the stairs and use them. Fix handrails that are broken or loose. Make sure that handrails are as long as the stairways. Check any carpeting to make sure that it is firmly attached to the stairs. Fix any carpet that is loose or worn. Avoid having throw rugs at the top or bottom of the stairs. If you do have throw rugs, attach them to the floor with carpet tape. Make sure that you have a light switch at the top of the stairs and the bottom of the stairs. If you do not have them, ask someone to add them for you. What else can I do to help prevent falls? Wear shoes that: Do not have high heels. Have rubber bottoms. Are comfortable and fit you well. Are closed at the toe. Do not wear sandals. If you use a stepladder: Make sure that it is fully opened. Do not climb a closed stepladder. Make sure that both sides of the stepladder are locked into place. Ask someone to hold it for you, if possible. Clearly mark and make sure that you can see: Any grab bars or handrails. First and last steps. Where the edge of each step is. Use tools that help you move around (mobility aids) if they are needed. These include: Canes. Walkers. Scooters. Crutches. Turn on the lights when you go into a dark area. Replace any light bulbs as soon as they burn out. Set up your furniture so you have a clear path. Avoid moving your furniture  around. If any of your floors are uneven, fix them. If there are any pets around you, be aware of where they are. Review your medicines with your doctor. Some medicines can make you feel dizzy. This can increase your chance of falling. Ask your doctor what other things that you can do to help prevent falls. This information is not intended to replace advice given to you by your health care provider. Make sure you discuss any questions you have with your health care provider. Document Released: 04/15/2009 Document Revised: 11/25/2015 Document Reviewed: 07/24/2014 Elsevier Interactive Patient Education  2017 [employer].
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
## 2024-07-28 ENCOUNTER — Ambulatory Visit: Payer: Self-pay | Admitting: Student

## 2024-07-29 ENCOUNTER — Ambulatory Visit: Admitting: Gastroenterology

## 2024-07-29 ENCOUNTER — Other Ambulatory Visit

## 2024-07-29 ENCOUNTER — Encounter: Payer: Self-pay | Admitting: Gastroenterology

## 2024-07-29 VITALS — BP 150/80 | HR 113 | Ht 67.0 in | Wt 140.0 lb

## 2024-07-29 DIAGNOSIS — K7030 Alcoholic cirrhosis of liver without ascites: Secondary | ICD-10-CM | POA: Diagnosis not present

## 2024-07-29 DIAGNOSIS — F17200 Nicotine dependence, unspecified, uncomplicated: Secondary | ICD-10-CM

## 2024-07-29 DIAGNOSIS — F17210 Nicotine dependence, cigarettes, uncomplicated: Secondary | ICD-10-CM | POA: Diagnosis not present

## 2024-07-29 DIAGNOSIS — Z860100 Personal history of colon polyps, unspecified: Secondary | ICD-10-CM | POA: Diagnosis not present

## 2024-07-29 DIAGNOSIS — F1020 Alcohol dependence, uncomplicated: Secondary | ICD-10-CM

## 2024-07-29 DIAGNOSIS — K219 Gastro-esophageal reflux disease without esophagitis: Secondary | ICD-10-CM

## 2024-07-29 DIAGNOSIS — D696 Thrombocytopenia, unspecified: Secondary | ICD-10-CM

## 2024-07-29 DIAGNOSIS — R7989 Other specified abnormal findings of blood chemistry: Secondary | ICD-10-CM | POA: Diagnosis not present

## 2024-07-29 LAB — CBC WITH DIFFERENTIAL/PLATELET
Basophils Absolute: 0 10*3/uL (ref 0.0–0.1)
Basophils Relative: 0.7 % (ref 0.0–3.0)
Eosinophils Absolute: 0 10*3/uL (ref 0.0–0.7)
Eosinophils Relative: 0.6 % (ref 0.0–5.0)
HCT: 45.9 % (ref 39.0–52.0)
Hemoglobin: 15.3 g/dL (ref 13.0–17.0)
Lymphocytes Relative: 28.6 % (ref 12.0–46.0)
Lymphs Abs: 1 10*3/uL (ref 0.7–4.0)
MCHC: 33.4 g/dL (ref 30.0–36.0)
MCV: 100.1 fl — ABNORMAL HIGH (ref 78.0–100.0)
Monocytes Absolute: 0.5 10*3/uL (ref 0.1–1.0)
Monocytes Relative: 15.8 % — ABNORMAL HIGH (ref 3.0–12.0)
Neutro Abs: 1.9 10*3/uL (ref 1.4–7.7)
Neutrophils Relative %: 54.3 % (ref 43.0–77.0)
Platelets: 80 10*3/uL — ABNORMAL LOW (ref 150.0–400.0)
RBC: 4.59 Mil/uL (ref 4.22–5.81)
RDW: 15.7 % — ABNORMAL HIGH (ref 11.5–15.5)
WBC: 3.4 10*3/uL — ABNORMAL LOW (ref 4.0–10.5)

## 2024-07-29 LAB — HEPATIC FUNCTION PANEL
ALT: 24 U/L (ref 3–53)
AST: 56 U/L — ABNORMAL HIGH (ref 5–37)
Albumin: 4.3 g/dL (ref 3.5–5.2)
Alkaline Phosphatase: 121 U/L — ABNORMAL HIGH (ref 39–117)
Bilirubin, Direct: 0.4 mg/dL — ABNORMAL HIGH (ref 0.1–0.3)
Total Bilirubin: 1.2 mg/dL (ref 0.2–1.2)
Total Protein: 9 g/dL — ABNORMAL HIGH (ref 6.0–8.3)

## 2024-07-29 LAB — PROTIME-INR
INR: 1.3 ratio — ABNORMAL HIGH (ref 0.8–1.0)
Prothrombin Time: 13.9 s — ABNORMAL HIGH (ref 9.6–13.1)

## 2024-07-29 LAB — BASIC METABOLIC PANEL WITH GFR
BUN: 6 mg/dL (ref 6–23)
CO2: 27 meq/L (ref 19–32)
Calcium: 9.5 mg/dL (ref 8.4–10.5)
Chloride: 101 meq/L (ref 96–112)
Creatinine, Ser: 0.75 mg/dL (ref 0.40–1.50)
GFR: 98.39 mL/min
Glucose, Bld: 102 mg/dL — ABNORMAL HIGH (ref 70–99)
Potassium: 4 meq/L (ref 3.5–5.1)
Sodium: 138 meq/L (ref 135–145)

## 2024-07-29 LAB — AMMONIA: Ammonia: 28 umol/L (ref 11–35)

## 2024-07-29 MED ORDER — PANTOPRAZOLE SODIUM 40 MG PO TBEC: 40.0000 mg | DELAYED_RELEASE_TABLET | Freq: Every day | ORAL | 2 refills | Status: DC

## 2024-07-29 NOTE — Patient Instructions (Addendum)
 Cirrhosis Continue lasix  and spirolactone Recommend low sodium diet Recommend alcohol cessation Will be due for abdominal ultrasound in June  GERD Continue pantoprazole  40 mg po daily  Recommend GERD diet  Hematology/oncology Please follow-up as recommended    Your provider has requested that you go to the basement level for lab work before leaving today. Press B on the elevator. The lab is located at the first door on the left as you exit the elevator.  _______________________________________________________  If your blood pressure at your visit was 140/90 or greater, please contact your primary care physician to follow up on this.  _______________________________________________________  If you are age 70 or older, your body mass index should be between 23-30. Your Body mass index is 21.93 kg/m. If this is out of the aforementioned range listed, please consider follow up with your Primary Care Provider.  If you are age 3 or younger, your body mass index should be between 19-25. Your Body mass index is 21.93 kg/m. If this is out of the aformentioned range listed, please consider follow up with your Primary Care Provider.   ________________________________________________________  The [facility] providers would like to encourage you to use MYCHART to communicate with providers for non-urgent requests or questions.  Due to long hold times on the telephone, sending your provider a message by [facility] Riyaan Heroux be a faster and more efficient way to get a response.  Please allow 48 business hours for a response.  Please remember that this is for non-urgent requests.  _______________________________________________________  [facility] is using a team-based approach to care.  Your team is made up of your doctor and two to three APPS. Our APPS (Nurse Practitioners and Physician Assistants) work with your physician to ensure care continuity for you. They are fully qualified to  address your health concerns and develop a treatment plan. They communicate directly with your gastroenterologist to care for you. Seeing the Advanced Practice Practitioners on your physician's team can help you by facilitating care more promptly, often allowing for earlier appointments, access to diagnostic testing, procedures, and other specialty referrals.   Thank you for trusting me with your gastrointestinal care. Lundon Verdejo, FNP-C

## 2024-07-30 ENCOUNTER — Ambulatory Visit

## 2024-07-30 ENCOUNTER — Other Ambulatory Visit: Payer: Self-pay

## 2024-07-30 ENCOUNTER — Other Ambulatory Visit: Payer: Self-pay | Admitting: Student

## 2024-07-30 ENCOUNTER — Ambulatory Visit: Payer: Self-pay | Admitting: Gastroenterology

## 2024-07-30 VITALS — BP 135/88 | HR 89 | Temp 98.4°F | Ht 67.0 in | Wt 142.6 lb

## 2024-07-30 DIAGNOSIS — F17200 Nicotine dependence, unspecified, uncomplicated: Secondary | ICD-10-CM | POA: Diagnosis not present

## 2024-07-30 DIAGNOSIS — F1020 Alcohol dependence, uncomplicated: Secondary | ICD-10-CM

## 2024-07-30 DIAGNOSIS — K7030 Alcoholic cirrhosis of liver without ascites: Secondary | ICD-10-CM

## 2024-07-30 DIAGNOSIS — Z23 Encounter for immunization: Secondary | ICD-10-CM | POA: Diagnosis not present

## 2024-07-30 DIAGNOSIS — R0609 Other forms of dyspnea: Secondary | ICD-10-CM

## 2024-07-30 DIAGNOSIS — F3342 Major depressive disorder, recurrent, in full remission: Secondary | ICD-10-CM

## 2024-07-30 DIAGNOSIS — E785 Hyperlipidemia, unspecified: Secondary | ICD-10-CM

## 2024-07-30 DIAGNOSIS — N529 Male erectile dysfunction, unspecified: Secondary | ICD-10-CM

## 2024-07-30 DIAGNOSIS — K219 Gastro-esophageal reflux disease without esophagitis: Secondary | ICD-10-CM

## 2024-07-30 MED ORDER — NICOTINE POLACRILEX 2 MG MT GUM: 2.0000 mg | CHEWING_GUM | OROMUCOSAL | 1 refills | Status: AC | PRN

## 2024-07-30 MED ORDER — COVID-19 MRNA VAC-TRIS(PFIZER) 30 MCG/0.3ML IM SUSY
0.3000 mL | PREFILLED_SYRINGE | Freq: Once | INTRAMUSCULAR | 0 refills | Status: AC
  Filled 2024-07-30: qty 0.3, 1d supply, fill #0

## 2024-07-30 MED ORDER — ALBUTEROL SULFATE HFA 108 (90 BASE) MCG/ACT IN AERS: 2.0000 | INHALATION_SPRAY | Freq: Four times a day (QID) | RESPIRATORY_TRACT | 2 refills | Status: AC | PRN

## 2024-07-30 MED ORDER — FUROSEMIDE 40 MG PO TABS: 40.0000 mg | ORAL_TABLET | Freq: Every day | ORAL | 2 refills | Status: AC

## 2024-07-30 MED ORDER — GABAPENTIN 300 MG PO CAPS: 300.0000 mg | ORAL_CAPSULE | Freq: Three times a day (TID) | ORAL | 2 refills | Status: AC

## 2024-07-30 MED ORDER — NALTREXONE HCL 50 MG PO TABS: 50.0000 mg | ORAL_TABLET | Freq: Every day | ORAL | 11 refills | Status: AC

## 2024-07-30 MED ORDER — SPIRONOLACTONE 100 MG PO TABS: 100.0000 mg | ORAL_TABLET | Freq: Every day | ORAL | 2 refills | Status: AC

## 2024-07-30 MED ORDER — SILDENAFIL CITRATE 50 MG PO TABS: ORAL_TABLET | ORAL | 2 refills | Status: AC

## 2024-07-30 MED ORDER — ATORVASTATIN CALCIUM 20 MG PO TABS: 20.0000 mg | ORAL_TABLET | Freq: Every day | ORAL | 11 refills | Status: AC

## 2024-07-30 MED ORDER — STIOLTO RESPIMAT 2.5-2.5 MCG/ACT IN AERS: 2.0000 | INHALATION_SPRAY | Freq: Every day | RESPIRATORY_TRACT | 3 refills | Status: AC

## 2024-07-30 MED ORDER — PANTOPRAZOLE SODIUM 40 MG PO TBEC: 40.0000 mg | DELAYED_RELEASE_TABLET | Freq: Every day | ORAL | 2 refills | Status: AC

## 2024-07-30 NOTE — Progress Notes (Signed)
 Agree with the assessment and plan as outlined by Deanna May, FNP-C.  PET/CT from 02/2023 did show an area of moderate hypermetabolism in the right suprahilar region without corresponding adenopathy.  Radiologist recommended contrast enhanced chest CT in 3 to 6 months to exclude developing adenopathy or a central lung nodule.  Subsequent CT chest in 05/2023 with a stable small spiculated nodular area in the suprahilar region with recommendation for continued surveillance at 3-6 months.  Does not appear that he had a repeat chest imaging since then.  Does not look like he has seen the Oncology Clinic since then either.  I recommend repeat CT chest, to be coordinated through the Oncology Clinic or his PCP.  By clinical description, sounds like he at least has early/mild (or possibly subclinical) hepatic encephalopathy, but his confusion may also be related to active drinking and intoxication.  Agree with further evaluation as outlined with low threshold for starting on lactulose +/- rifaximin.  Dylan Fortin, DO, [facility]

## 2024-07-30 NOTE — Patient Instructions (Addendum)
 It was wonderful seeing you today!   1) Great job reducing your alcohol intake...that is something to be proud of. Try taking the Naltrexone  every day to have the best effect.   - Be on the lookout for someone to call you about alcoholism resources   2) I refilled all of your medications, they will be mailed to you  3) Someone will call you to schedule your lung cancer screening   If you have any questions please feel free to the call the clinic at anytime at [phone].  Have a blessed day,  Dr. Charmayne

## 2024-07-30 NOTE — Telephone Encounter (Signed)
-----   Message from [facility]: Lambert Rhonda W, NP sent at 07/30/2024  7:23 AM EST ----- Karna- Let patient know labs show: Ammonia level normal- I checked this due to him reporting confusion, indicates no evidence of liver disease affecting his brain function. Wbc low- increases risk of infections, he needs to use standard precautions and stay up to date on vaccinations. Platelets are low at 80, this is around his baseline, associated with the cirrhosis, increases risk of bleeding. Avoid NSAIDs. Electrolytes are good. Kidney function is good. Liver enzymes are still elevated. He needs to follow-up on rehab and discontinue all drinking.   Deanna, NP

## 2024-07-31 LAB — AFP TUMOR MARKER: AFP-Tumor Marker: 6.6 ng/mL — ABNORMAL HIGH
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
## 2024-07-31 NOTE — Assessment & Plan Note (Addendum)
 Current regimen includes furosemide  40 mg and spironolactone  100 mg daily.  No history of SBP or variceal bleeding.  He takes pantoprazole  40 mg daily for indigestion.  EGD and colonoscopy in 07/2022, repeat in 2027.  He last saw GI on 07/29/2024.  Right upper quadrant ultrasound in 06/2024 C/W hepatic steatosis and cirrhosis.  MELD score at LOV in 05/2024 was 14.  Using labs collected at GI appointment 1/27, MELD score now 10.  Today he feels well and has no complaints. Plan HCC screening due in 12/2024 Follow-up with GI in 6 months See below for alcohol cessation Orders:   furosemide  (LASIX ) 40 MG tablet; Take 1 tablet (40 mg total) by mouth daily.   spironolactone  (ALDACTONE ) 100 MG tablet; Take 1 tablet (100 mg total) by mouth daily.

## 2024-07-31 NOTE — Assessment & Plan Note (Addendum)
 CT chest with contrast in 05/2023 noted stable small spiculated area on the right.  With recommendation for continued surveillance in 3 to 6 months.  I do not see any follow-up imaging.  Patient is agreeable to doing the low-dose CT scan, placed this order today. Plan Ensure patient has had repeat chest imaging at follow-up Orders:   nicotine  polacrilex (NICORETTE ) 2 MG gum; Take 1 each (2 mg total) by mouth as needed for smoking cessation.   CT CHEST LUNG CA SCREEN LOW DOSE W/O CM; Future   AMB Referral [facility]BCI Care Management

## 2024-07-31 NOTE — Assessment & Plan Note (Addendum)
 Per chart, has never had PFTs to confirm COPD although has an extensive tobacco use history.  Ordered PFTs in 08/2023 although patient has not had this done.  I did not remind him of this today.  Today he has no shortness of breath and exam unremarkable.  He has cut down on smoking and he thinks that the gum helps him.  Will refill inhalers and gum today. Plan Continue Respimat inhaler twice daily for maintenance Continue albuterol  as needed for rescue Follow-up PFTs at next appointment Orders:   Tiotropium Bromide-Olodaterol (STIOLTO RESPIMAT ) 2.5-2.5 MCG/ACT AERS; Inhale 2 puffs into the lungs daily.   albuterol  (VENTOLIN  HFA) 108 (90 Base) MCG/ACT inhaler; Inhale 2 puffs into the lungs every 6 (six) hours as needed for wheezing or shortness of breath.

## 2024-07-31 NOTE — Assessment & Plan Note (Addendum)
 Previously he was drinking a significant amount of liquor and beer every day.  He would begin his day with a few shots.  Patient has abstained from drinking liquor for the last couple of months whereas he was usually drinking about 1/5 a day.  He has only been drinking beer daily now.  He has not had anything to drink today.  He feels better and has lost some friends in the process but reflects on the good friends that have encouraged him to quit.  He wants to try disulfiram although unable to due to cirrhosis.  He currently thinks the naltrexone  helps although he takes it inconsistently.  Recommended that he take the naltrexone  every day first thing when he wakes up.  We reviewed his recent lab work and discussed that his liver enzymes and his platelets are improving.  Discussed how his labs likely reflect his liquor cessation.  He is interested in alcohol cessation resources, specifically alcohol help groups.  Discussed PPCI referral, he is agreeable.  Recommend continuing naltrexone  as he still has compensated cirrhosis, naltrexone  will have to be discontinued if he has any degree of decompensation. Plan Naltrexone  daily Continue to monitor [facility] referral for alcohol cessation resources Orders:   gabapentin  (NEURONTIN ) 300 MG capsule; Take 1 capsule (300 mg total) by mouth 3 (three) times daily.   naltrexone  (DEPADE) 50 MG tablet; Take 1 tablet (50 mg total) by mouth daily.   AMB Referral VBCI Care Management

## 2024-08-06 NOTE — Progress Notes (Signed)
 Internal Medicine Clinic Attending  Case discussed with the resident at the time of the visit.  We reviewed the resident's history and exam and pertinent patient test results.  I agree with the assessment, diagnosis, and plan of care documented in the resident's note.

## 2024-08-29 ENCOUNTER — Telehealth: Admitting: Licensed Clinical Social Worker

## 2024-09-22 ENCOUNTER — Ambulatory Visit (HOSPITAL_COMMUNITY)

## 2024-10-28 ENCOUNTER — Ambulatory Visit: Payer: Self-pay
